# Patient Record
Sex: Female | Born: 1975 | Race: White | Hispanic: No | Marital: Married | Smoking: Former smoker
Health system: Southern US, Community
[De-identification: ages and names within clinical notes are randomized; demographics above are authoritative.]

## PROBLEM LIST (undated history)

## (undated) DIAGNOSIS — K219 Gastro-esophageal reflux disease without esophagitis: Secondary | ICD-10-CM

## (undated) DIAGNOSIS — F419 Anxiety disorder, unspecified: Secondary | ICD-10-CM

## (undated) DIAGNOSIS — J45909 Unspecified asthma, uncomplicated: Secondary | ICD-10-CM

## (undated) DIAGNOSIS — T783XXA Angioneurotic edema, initial encounter: Secondary | ICD-10-CM

## (undated) HISTORY — PX: TUBAL LIGATION: SHX77

## (undated) HISTORY — DX: Unspecified asthma, uncomplicated: J45.909

## (undated) HISTORY — DX: Anxiety disorder, unspecified: F41.9

## (undated) HISTORY — DX: Angioneurotic edema, initial encounter: T78.3XXA

## (undated) HISTORY — PX: TYMPANOSTOMY TUBE PLACEMENT: SHX32

## (undated) HISTORY — PX: GALLBLADDER SURGERY: SHX652

---

## 2004-10-13 ENCOUNTER — Ambulatory Visit: Payer: Self-pay | Admitting: *Deleted

## 2004-12-08 ENCOUNTER — Ambulatory Visit: Payer: Self-pay | Admitting: Family Medicine

## 2005-07-28 ENCOUNTER — Ambulatory Visit: Payer: Self-pay | Admitting: Family Medicine

## 2005-08-10 ENCOUNTER — Ambulatory Visit: Payer: Self-pay | Admitting: Family Medicine

## 2005-09-03 ENCOUNTER — Ambulatory Visit: Payer: Self-pay | Admitting: Family Medicine

## 2005-09-07 ENCOUNTER — Ambulatory Visit: Payer: Self-pay | Admitting: Family Medicine

## 2005-10-27 ENCOUNTER — Ambulatory Visit: Payer: Self-pay | Admitting: Family Medicine

## 2006-02-26 ENCOUNTER — Ambulatory Visit: Payer: Self-pay | Admitting: Family Medicine

## 2006-11-30 ENCOUNTER — Ambulatory Visit: Payer: Self-pay | Admitting: Family Medicine

## 2011-01-04 ENCOUNTER — Emergency Department (HOSPITAL_COMMUNITY)
Admission: EM | Admit: 2011-01-04 | Discharge: 2011-01-04 | Disposition: A | Discharge status: Home / Self Care | Payer: PRIVATE HEALTH INSURANCE | Attending: Emergency Medicine | Admitting: Emergency Medicine

## 2011-01-04 ENCOUNTER — Emergency Department (HOSPITAL_COMMUNITY): Payer: PRIVATE HEALTH INSURANCE

## 2011-01-04 DIAGNOSIS — R509 Fever, unspecified: Secondary | ICD-10-CM | POA: Insufficient documentation

## 2011-01-04 DIAGNOSIS — J40 Bronchitis, not specified as acute or chronic: Secondary | ICD-10-CM | POA: Insufficient documentation

## 2011-01-04 DIAGNOSIS — R51 Headache: Secondary | ICD-10-CM | POA: Insufficient documentation

## 2011-01-04 DIAGNOSIS — R05 Cough: Secondary | ICD-10-CM | POA: Insufficient documentation

## 2011-01-04 DIAGNOSIS — J029 Acute pharyngitis, unspecified: Secondary | ICD-10-CM | POA: Insufficient documentation

## 2011-01-04 DIAGNOSIS — R059 Cough, unspecified: Secondary | ICD-10-CM | POA: Insufficient documentation

## 2011-01-04 LAB — RAPID STREP SCREEN (MED CTR MEBANE ONLY): Streptococcus, Group A Screen (Direct): NEGATIVE

## 2014-02-08 ENCOUNTER — Encounter (HOSPITAL_COMMUNITY): Payer: Self-pay | Admitting: Emergency Medicine

## 2014-02-08 ENCOUNTER — Emergency Department (HOSPITAL_COMMUNITY)
Admission: EM | Admit: 2014-02-08 | Discharge: 2014-02-08 | Disposition: A | Discharge status: Home / Self Care | Payer: Medicaid Other | Attending: Emergency Medicine | Admitting: Emergency Medicine

## 2014-02-08 DIAGNOSIS — Z79899 Other long term (current) drug therapy: Secondary | ICD-10-CM | POA: Insufficient documentation

## 2014-02-08 DIAGNOSIS — L578 Other skin changes due to chronic exposure to nonionizing radiation: Secondary | ICD-10-CM | POA: Insufficient documentation

## 2014-02-08 DIAGNOSIS — IMO0002 Reserved for concepts with insufficient information to code with codable children: Secondary | ICD-10-CM | POA: Insufficient documentation

## 2014-02-08 MED ORDER — DEXAMETHASONE SODIUM PHOSPHATE 4 MG/ML IJ SOLN
8.0000 mg | Freq: Once | INTRAMUSCULAR | Status: AC
  Administered 2014-02-08: 8 mg via INTRAMUSCULAR
  Filled 2014-02-08: qty 2

## 2014-02-08 MED ORDER — IBUPROFEN 800 MG PO TABS
800.0000 mg | ORAL_TABLET | Freq: Once | ORAL | Status: AC
  Administered 2014-02-08: 800 mg via ORAL
  Filled 2014-02-08: qty 1

## 2014-02-08 MED ORDER — CEPHALEXIN 500 MG PO CAPS
500.0000 mg | ORAL_CAPSULE | Freq: Once | ORAL | Status: AC
  Administered 2014-02-08: 500 mg via ORAL
  Filled 2014-02-08: qty 1

## 2014-02-08 MED ORDER — DEXAMETHASONE 6 MG PO TABS: ORAL_TABLET | ORAL | Status: DC

## 2014-02-08 MED ORDER — CEPHALEXIN 500 MG PO CAPS: 500.0000 mg | ORAL_CAPSULE | Freq: Four times a day (QID) | ORAL | Status: DC

## 2014-02-08 MED ORDER — IBUPROFEN 800 MG PO TABS: 800.0000 mg | ORAL_TABLET | Freq: Three times a day (TID) | ORAL | Status: DC
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
## 2014-02-08 NOTE — ED Provider Notes (Signed)
Medical screening examination/treatment/procedure(s) were performed by non-physician practitioner and as supervising physician I was immediately available for consultation/collaboration.   EKG Interpretation None        Chrisoula Zegarra M Tiombe Tomeo, MD 02/08/14 1656 
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
## 2014-02-08 NOTE — ED Notes (Signed)
Pt c/o facial swelling and anxiety since Tuesday. Denies sob. Airway patent. Voice clear. No hoarseness.

## 2014-02-17 ENCOUNTER — Emergency Department (HOSPITAL_COMMUNITY): Payer: Medicaid Other

## 2014-02-17 ENCOUNTER — Emergency Department (HOSPITAL_COMMUNITY)
Admission: EM | Admit: 2014-02-17 | Discharge: 2014-02-17 | Disposition: A | Discharge status: Home / Self Care | Payer: Medicaid Other | Attending: Emergency Medicine | Admitting: Emergency Medicine

## 2014-02-17 ENCOUNTER — Encounter (HOSPITAL_COMMUNITY): Payer: Self-pay | Admitting: Emergency Medicine

## 2014-02-17 DIAGNOSIS — R109 Unspecified abdominal pain: Secondary | ICD-10-CM

## 2014-02-17 DIAGNOSIS — Z3202 Encounter for pregnancy test, result negative: Secondary | ICD-10-CM | POA: Insufficient documentation

## 2014-02-17 DIAGNOSIS — Z792 Long term (current) use of antibiotics: Secondary | ICD-10-CM | POA: Insufficient documentation

## 2014-02-17 DIAGNOSIS — R1011 Right upper quadrant pain: Secondary | ICD-10-CM | POA: Insufficient documentation

## 2014-02-17 DIAGNOSIS — IMO0002 Reserved for concepts with insufficient information to code with codable children: Secondary | ICD-10-CM | POA: Insufficient documentation

## 2014-02-17 DIAGNOSIS — Z791 Long term (current) use of non-steroidal anti-inflammatories (NSAID): Secondary | ICD-10-CM | POA: Insufficient documentation

## 2014-02-17 DIAGNOSIS — Z872 Personal history of diseases of the skin and subcutaneous tissue: Secondary | ICD-10-CM | POA: Insufficient documentation

## 2014-02-17 DIAGNOSIS — Z9851 Tubal ligation status: Secondary | ICD-10-CM | POA: Insufficient documentation

## 2014-02-17 LAB — CBC WITH DIFFERENTIAL/PLATELET
Basophils Absolute: 0 10*3/uL (ref 0.0–0.1)
Basophils Relative: 0 % (ref 0–1)
EOS ABS: 0.2 10*3/uL (ref 0.0–0.7)
EOS PCT: 2 % (ref 0–5)
HEMATOCRIT: 44.9 % (ref 36.0–46.0)
Hemoglobin: 15.6 g/dL — ABNORMAL HIGH (ref 12.0–15.0)
LYMPHS ABS: 2.6 10*3/uL (ref 0.7–4.0)
LYMPHS PCT: 24 % (ref 12–46)
MCH: 31.6 pg (ref 26.0–34.0)
MCHC: 34.7 g/dL (ref 30.0–36.0)
MCV: 91.1 fL (ref 78.0–100.0)
MONO ABS: 0.6 10*3/uL (ref 0.1–1.0)
Monocytes Relative: 6 % (ref 3–12)
Neutro Abs: 7.6 10*3/uL (ref 1.7–7.7)
Neutrophils Relative %: 69 % (ref 43–77)
Platelets: 238 10*3/uL (ref 150–400)
RBC: 4.93 MIL/uL (ref 3.87–5.11)
RDW: 12.7 % (ref 11.5–15.5)
WBC: 11 10*3/uL — AB (ref 4.0–10.5)

## 2014-02-17 LAB — URINALYSIS, ROUTINE W REFLEX MICROSCOPIC
BILIRUBIN URINE: NEGATIVE
Glucose, UA: NEGATIVE mg/dL
KETONES UR: NEGATIVE mg/dL
Leukocytes, UA: NEGATIVE
Nitrite: NEGATIVE
Protein, ur: NEGATIVE mg/dL
SPECIFIC GRAVITY, URINE: 1.02 (ref 1.005–1.030)
UROBILINOGEN UA: 0.2 mg/dL (ref 0.0–1.0)
pH: 6 (ref 5.0–8.0)

## 2014-02-17 LAB — COMPREHENSIVE METABOLIC PANEL
ALT: 63 U/L — ABNORMAL HIGH (ref 0–35)
AST: 131 U/L — ABNORMAL HIGH (ref 0–37)
Albumin: 2.9 g/dL — ABNORMAL LOW (ref 3.5–5.2)
Alkaline Phosphatase: 70 U/L (ref 39–117)
BUN: 13 mg/dL (ref 6–23)
CALCIUM: 8.9 mg/dL (ref 8.4–10.5)
CO2: 24 meq/L (ref 19–32)
CREATININE: 0.76 mg/dL (ref 0.50–1.10)
Chloride: 100 mEq/L (ref 96–112)
GLUCOSE: 96 mg/dL (ref 70–99)
Potassium: 3.8 mEq/L (ref 3.7–5.3)
Sodium: 135 mEq/L — ABNORMAL LOW (ref 137–147)
Total Bilirubin: 0.4 mg/dL (ref 0.3–1.2)
Total Protein: 6.9 g/dL (ref 6.0–8.3)

## 2014-02-17 LAB — URINE MICROSCOPIC-ADD ON

## 2014-02-17 LAB — PREGNANCY, URINE: Preg Test, Ur: NEGATIVE

## 2014-02-17 LAB — LIPASE, BLOOD: LIPASE: 47 U/L (ref 11–59)

## 2014-02-17 MED ORDER — HYDROMORPHONE HCL PF 1 MG/ML IJ SOLN
0.5000 mg | Freq: Once | INTRAMUSCULAR | Status: AC
  Administered 2014-02-17: 0.5 mg via INTRAVENOUS
  Filled 2014-02-17: qty 1

## 2014-02-17 MED ORDER — CIPROFLOXACIN HCL 500 MG PO TABS: 500.0000 mg | ORAL_TABLET | Freq: Two times a day (BID) | ORAL | Status: DC

## 2014-02-17 MED ORDER — HYDROCODONE-ACETAMINOPHEN 5-325 MG PO TABS: 1.0000 | ORAL_TABLET | Freq: Four times a day (QID) | ORAL | Status: DC | PRN

## 2014-02-17 MED ORDER — ONDANSETRON HCL 4 MG/2ML IJ SOLN
4.0000 mg | Freq: Once | INTRAMUSCULAR | Status: AC
  Administered 2014-02-17: 4 mg via INTRAVENOUS
  Filled 2014-02-17: qty 2

## 2014-02-17 MED ORDER — PROMETHAZINE HCL 25 MG PO TABS: 25.0000 mg | ORAL_TABLET | Freq: Four times a day (QID) | ORAL | Status: DC | PRN

## 2014-02-17 NOTE — Discharge Instructions (Signed)
Follow up with dr. Lovell SheehanJenkins Tuesday at 930am.  Call Monday to confirm

## 2014-02-17 NOTE — ED Provider Notes (Signed)
CSN: [id number]     Arrival date & time 02/17/14  [id number] History   This chart was scribed for Summer LennertJoseph L Pattrick Bady, MD by Marica OtterNusrat Rahman, ED Scribe. This patient was seen in room APA06/APA06 and the patient's care was started at 7:41 AM.  PCP: PROVIDER NOT IN SYSTEM   Chief Complaint  Patient presents with  . Flank Pain   Patient is a 38 y.o. female presenting with flank pain. The history is provided by the patient. No language interpreter was used.  Flank Pain The current episode started 3 to 5 hours ago. The problem occurs constantly. The problem has not changed since onset.Associated symptoms include abdominal pain.   HPI Comments: Summer [name] is a 38 y.o. female who presents to the Emergency Department complaining of constant right flank pain and associated RUQ abd pain onset at approximately 0300 today. Pt denies fever, chills, Hx of surgery of the abd. Pt denies any other symptoms. Pt denies any Hx of having her gallbladder checked.   Pt was treated at the ED on 02/08/14 for dermatitis due to sunburn. Pt was prescribed oral steroids, ibuprofen 800 mg, and keflex 500mg  for possible secondary infection.   History reviewed. No pertinent past medical history. Past Surgical History  Procedure Laterality Date  . Tubal ligation    . Tympanostomy tube placement     No family history on file. History  Substance Use Topics  . Smoking status: Never Smoker   . Smokeless tobacco: Not on file  . Alcohol Use: No   OB History   Grav Para Term Preterm Abortions TAB SAB Ect Mult Living                 Review of Systems  Gastrointestinal: Positive for abdominal pain.  Genitourinary: Positive for flank pain (right ).  All other systems reviewed and are negative.     Allergies  Review of patient's allergies indicates no known allergies.  Home Medications   Current Outpatient Rx  Name  Route  Sig  Dispense  Refill  . cephALEXin (KEFLEX) 500 MG capsule   Oral   Take 1 capsule (500 mg  total) by mouth 4 (four) times daily.   20 capsule   0   . dexamethasone (DECADRON) 6 MG tablet      1 po bid with food   10 tablet   0   . diphenhydrAMINE (BENADRYL) 25 mg capsule   Oral   Take 25 mg by mouth every 6 (six) hours as needed for allergies.         . hydrocortisone cream 1 %   Topical   Apply 1 application topically 2 (two) times daily.         Marland Kitchen. ibuprofen (ADVIL,MOTRIN) 800 MG tablet   Oral   Take 1 tablet (800 mg total) by mouth 3 (three) times daily.   21 tablet   0     Take with food   . ranitidine (ZANTAC) 75 MG tablet   Oral   Take 75 mg by mouth 2 (two) times daily.          Triage Vitals: BP 104/60  Pulse 83  Temp(Src) 98.2 F (36.8 C) (Oral)  Resp 16  Ht 5\' 3"  (1.6 m)  Wt 210 lb (95.255 kg)  BMI 37.21 kg/m2  SpO2 98%  LMP 01/17/2014 Physical Exam  Nursing note and vitals reviewed. Constitutional: She is oriented to person, place, and time. She appears well-developed.  HENT:  Head:  Normocephalic.  Eyes: Conjunctivae and EOM are normal. No scleral icterus.  Neck: Neck supple. No thyromegaly present.  Cardiovascular: Normal rate and regular rhythm.  Exam reveals no gallop and no friction rub.   No murmur heard. Pulmonary/Chest: No stridor. She has no wheezes. She has no rales. She exhibits no tenderness.  Abdominal: She exhibits no distension. There is tenderness ( moderate RUQ tenderness). There is no rebound.  Musculoskeletal: Normal range of motion. She exhibits no edema.  Lymphadenopathy:    She has no cervical adenopathy.  Neurological: She is oriented to person, place, and time. She exhibits normal muscle tone. Coordination normal.  Skin: Skin is warm and dry. No rash noted. No erythema.  Psychiatric: She has a normal mood and affect. Her behavior is normal.    ED Course  Procedures (including critical care time) DIAGNOSTIC STUDIES: Oxygen Saturation is 98% on RA, normal by my interpretation.    COORDINATION OF  CARE: 7:43 AM-Discussed treatment plan which includes UA, meds and labs with pt at bedside and pt agreed to plan.   Labs Review Labs Reviewed  URINALYSIS, ROUTINE W REFLEX MICROSCOPIC   Imaging Review No results found.   EKG Interpretation None    spoke wit dr. Lovell Sheehan and he will follow up Tuesday with  The pt  MDM   Final diagnoses:  None   The chart was scribed for me under my direct supervision.  I personally performed the history, physical, and medical decision making and all procedures in the evaluation of this patient.Summer Lennert, MD 02/17/14 1003

## 2014-02-17 NOTE — ED Notes (Signed)
Complain of pain in right flank area that started around 0300 today. Denies other symptoms

## 2014-02-20 ENCOUNTER — Encounter (HOSPITAL_COMMUNITY)
Admission: RE | Admit: 2014-02-20 | Discharge: 2014-02-20 | Disposition: A | Discharge status: Home / Self Care | Payer: Medicaid Other | Source: Ambulatory Visit | Attending: General Surgery | Admitting: General Surgery

## 2014-02-20 ENCOUNTER — Encounter (HOSPITAL_COMMUNITY): Payer: Self-pay

## 2014-02-20 HISTORY — DX: Gastro-esophageal reflux disease without esophagitis: K21.9

## 2014-02-20 NOTE — Patient Instructions (Signed)
Summer [name]  02/20/2014   Your procedure is scheduled on:  02/21/2014  [facility] at  825  AM.  Call this number if you have problems the morning of surgery: [phone]   Remember:   Do not eat food or drink liquids after midnight.   Take these medicines the morning of surgery with A SIP OF WATER:  Decadron, hydorcodone, phenergan, zantac   Do not wear jewelry, make-up or nail polish.  Do not wear lotions, powders, or perfumes.   Do not shave 48 hours prior to surgery. Men may shave face and neck.  Do not bring valuables to the hospital.  [facility] any belongings or valuables.               Contacts, dentures or bridgework may not be worn into surgery.  Leave suitcase in the car. After surgery it may be brought to your room.  For patients admitted to the hospital, discharge time is determined by your treatment team.               Patients discharged the day of surgery will not be allowed to drive home.  Name and phone number of your driver: family  Special Instructions: Shower using CHG 2 nights before surgery and the night before surgery.  If you shower the day of surgery use CHG.  Use special wash - you have one bottle of CHG for all showers.  You should use approximately 1/3 of the bottle for each shower.   Please read over the following fact sheets that you were given: Pain Booklet, Coughing and Deep Breathing, Surgical Site Infection Prevention, Anesthesia Post-op Instructions and Care and Recovery After Surgery Laparoscopic Cholecystectomy Laparoscopic cholecystectomy is surgery to remove the gallbladder. The gallbladder is located in the upper right part of the abdomen, behind the liver. It is a storage sac for bile produced in the liver. Bile aids in the digestion and absorption of fats. Cholecystectomy is often done for inflammation of the gallbladder (cholecystitis). This condition is usually caused by a buildup of gallstones  (cholelithiasis) in your gallbladder. Gallstones can block the flow of bile, resulting in inflammation and pain. In severe cases, emergency surgery may be required. When emergency surgery is not required, you will have time to prepare for the procedure. Laparoscopic surgery is an alternative to open surgery. Laparoscopic surgery has a shorter recovery time. Your common bile duct may also need to be examined during the procedure. If stones are found in the common bile duct, they may be removed. LET [facility] KNOW ABOUT:  Any allergies you have.  All medicines you are taking, including vitamins, herbs, eye drops, creams, and over-the-counter medicines.  Previous problems you or members of your family have had with the use of anesthetics.  Any blood disorders you have.  Previous surgeries you have had.  Medical conditions you have. RISKS AND COMPLICATIONS Generally, this is a safe procedure. However, as with any procedure, complications can occur. Possible complications include:  Infection.  Damage to the common bile duct, nerves, arteries, veins, or other internal organs such as the stomach, liver, or intestines.  Bleeding.  A stone may remain in the common bile duct.  A bile leak from the cyst duct that is clipped when your gallbladder is removed.  The need to convert to open surgery, which requires a larger incision in the abdomen. This may be necessary if your surgeon  thinks it is not safe to continue with a laparoscopic procedure. BEFORE THE PROCEDURE  Ask your health care provider about changing or stopping any regular medicines. You will need to stop taking aspirin or blood thinners at least 5 days prior to surgery.  Do not eat or drink anything after midnight the night before surgery.  Let your health care provider know if you develop a cold or other infectious problem before surgery. PROCEDURE   You will be given medicine to make you sleep through the  procedure (general anesthetic). A breathing tube will be placed in your mouth.  When you are asleep, your surgeon will make several small cuts (incisions) in your abdomen.  A thin, lighted tube with a tiny camera on the end (laparoscope) is inserted through one of the small incisions. The camera on the laparoscope sends a picture to a TV screen in the operating room. This gives the surgeon a good view inside your abdomen.  A gas will be pumped into your abdomen. This expands your abdomen so that the surgeon has more room to perform the surgery.  Other tools needed for the procedure are inserted through the other incisions. The gallbladder is removed through one of the incisions.  After the removal of your gallbladder, the incisions will be closed with stitches, staples, or skin glue. AFTER THE PROCEDURE  You will be taken to a recovery area where your progress will be checked often.  You may be allowed to go home the same day if your pain is controlled and you can tolerate liquids. Document Released: 10/26/2005 Document Revised: 08/16/2013 Document Reviewed: 06/07/2013 [facility] Patient Information 2014 Summer [name]. PATIENT INSTRUCTIONS POST-ANESTHESIA  IMMEDIATELY FOLLOWING SURGERY:  Do not drive or operate machinery for the first twenty four hours after surgery.  Do not make any important decisions for twenty four hours after surgery or while taking narcotic pain medications or sedatives.  If you develop intractable nausea and vomiting or a severe headache please notify your doctor immediately.  FOLLOW-UP:  Please make an appointment with your surgeon as instructed. You do not need to follow up with anesthesia unless specifically instructed to do so.  WOUND CARE INSTRUCTIONS (if applicable):  Keep a dry clean dressing on the anesthesia/puncture wound site if there is drainage.  Once the wound has quit draining you may leave it open to air.  Generally you should leave the bandage intact  for twenty four hours unless there is drainage.  If the epidural site drains for more than 36-48 hours please call the anesthesia department.  QUESTIONS?:  Please feel free to call your physician or the hospital operator if you have any questions, and they will be happy to assist you.

## 2014-02-20 NOTE — Pre-Procedure Instructions (Signed)
Patient given information to sign up for my chart at home. 

## 2014-02-21 ENCOUNTER — Ambulatory Visit (HOSPITAL_COMMUNITY)
Admission: RE | Admit: 2014-02-21 | Discharge: 2014-02-21 | Disposition: A | Discharge status: Home / Self Care | Payer: Medicaid Other | Source: Ambulatory Visit | Attending: General Surgery | Admitting: General Surgery

## 2014-02-21 ENCOUNTER — Encounter (HOSPITAL_COMMUNITY)
Admission: RE | Disposition: A | Discharge status: Home / Self Care | Payer: Self-pay | Source: Ambulatory Visit | Attending: General Surgery

## 2014-02-21 ENCOUNTER — Encounter (HOSPITAL_COMMUNITY): Payer: Self-pay | Admitting: *Deleted

## 2014-02-21 ENCOUNTER — Encounter (HOSPITAL_COMMUNITY): Payer: Medicaid Other | Admitting: Anesthesiology

## 2014-02-21 ENCOUNTER — Ambulatory Visit (HOSPITAL_COMMUNITY): Payer: Medicaid Other | Admitting: Anesthesiology

## 2014-02-21 DIAGNOSIS — Z01812 Encounter for preprocedural laboratory examination: Secondary | ICD-10-CM | POA: Insufficient documentation

## 2014-02-21 DIAGNOSIS — K219 Gastro-esophageal reflux disease without esophagitis: Secondary | ICD-10-CM | POA: Insufficient documentation

## 2014-02-21 DIAGNOSIS — Z87891 Personal history of nicotine dependence: Secondary | ICD-10-CM | POA: Insufficient documentation

## 2014-02-21 DIAGNOSIS — Z79899 Other long term (current) drug therapy: Secondary | ICD-10-CM | POA: Insufficient documentation

## 2014-02-21 DIAGNOSIS — K8010 Calculus of gallbladder with chronic cholecystitis without obstruction: Secondary | ICD-10-CM | POA: Insufficient documentation

## 2014-02-21 HISTORY — PX: CHOLECYSTECTOMY: SHX55

## 2014-02-21 SURGERY — LAPAROSCOPIC CHOLECYSTECTOMY
Anesthesia: General | Site: Abdomen

## 2014-02-21 MED ORDER — BUPIVACAINE HCL (PF) 0.5 % IJ SOLN
INTRAMUSCULAR | Status: DC | PRN
  Administered 2014-02-21: 10 mL

## 2014-02-21 MED ORDER — PROPOFOL 10 MG/ML IV BOLUS
INTRAVENOUS | Status: DC | PRN
  Administered 2014-02-21: 150 mg via INTRAVENOUS

## 2014-02-21 MED ORDER — CHLORHEXIDINE GLUCONATE 4 % EX LIQD: 1.0000 "application " | Freq: Once | CUTANEOUS | Status: DC

## 2014-02-21 MED ORDER — OXYCODONE-ACETAMINOPHEN 7.5-325 MG PO TABS
1.0000 | ORAL_TABLET | ORAL | Status: DC | PRN
Start: 2014-02-21 — End: 2015-12-16

## 2014-02-21 MED ORDER — ONDANSETRON HCL 4 MG/2ML IJ SOLN
INTRAMUSCULAR | Status: AC
  Filled 2014-02-21: qty 2

## 2014-02-21 MED ORDER — ROCURONIUM BROMIDE 100 MG/10ML IV SOLN
INTRAVENOUS | Status: DC | PRN
  Administered 2014-02-21: 15 mg via INTRAVENOUS

## 2014-02-21 MED ORDER — KETOROLAC TROMETHAMINE 30 MG/ML IJ SOLN
30.0000 mg | Freq: Once | INTRAMUSCULAR | Status: AC
  Administered 2014-02-21: 30 mg via INTRAVENOUS

## 2014-02-21 MED ORDER — BUPIVACAINE HCL (PF) 0.5 % IJ SOLN
INTRAMUSCULAR | Status: AC
  Filled 2014-02-21: qty 30

## 2014-02-21 MED ORDER — FENTANYL CITRATE 0.05 MG/ML IJ SOLN
25.0000 ug | INTRAMUSCULAR | Status: DC | PRN
  Administered 2014-02-21 (×2): 50 ug via INTRAVENOUS

## 2014-02-21 MED ORDER — GLYCOPYRROLATE 0.2 MG/ML IJ SOLN
INTRAMUSCULAR | Status: AC
  Filled 2014-02-21: qty 1

## 2014-02-21 MED ORDER — DEXTROSE 5 % IV SOLN
INTRAVENOUS | Status: DC | PRN
  Administered 2014-02-21: 10:00:00 via INTRAVENOUS

## 2014-02-21 MED ORDER — MIDAZOLAM HCL 2 MG/2ML IJ SOLN
1.0000 mg | INTRAMUSCULAR | Status: DC | PRN
  Administered 2014-02-21: 2 mg via INTRAVENOUS

## 2014-02-21 MED ORDER — PROPOFOL 10 MG/ML IV BOLUS
INTRAVENOUS | Status: AC
  Filled 2014-02-21: qty 20

## 2014-02-21 MED ORDER — SUCCINYLCHOLINE CHLORIDE 20 MG/ML IJ SOLN
INTRAMUSCULAR | Status: DC | PRN
  Administered 2014-02-21: 160 mg via INTRAVENOUS

## 2014-02-21 MED ORDER — GLYCOPYRROLATE 0.2 MG/ML IJ SOLN
INTRAMUSCULAR | Status: DC | PRN
  Administered 2014-02-21: 0.2 mg via INTRAVENOUS

## 2014-02-21 MED ORDER — CIPROFLOXACIN IN D5W 400 MG/200ML IV SOLN
400.0000 mg | INTRAVENOUS | Status: AC
  Administered 2014-02-21: 400 mg via INTRAVENOUS
  Filled 2014-02-21: qty 200

## 2014-02-21 MED ORDER — NEOSTIGMINE METHYLSULFATE 1 MG/ML IJ SOLN
INTRAMUSCULAR | Status: DC | PRN
  Administered 2014-02-21 (×2): 1 mg via INTRAVENOUS

## 2014-02-21 MED ORDER — ONDANSETRON HCL 4 MG/2ML IJ SOLN
4.0000 mg | Freq: Once | INTRAMUSCULAR | Status: AC
  Administered 2014-02-21: 4 mg via INTRAVENOUS

## 2014-02-21 MED ORDER — MIDAZOLAM HCL 2 MG/2ML IJ SOLN
INTRAMUSCULAR | Status: AC
Start: 2014-02-21 — End: 2014-02-21
  Filled 2014-02-21: qty 2

## 2014-02-21 MED ORDER — 0.9 % SODIUM CHLORIDE (POUR BTL) OPTIME
TOPICAL | Status: DC | PRN
  Administered 2014-02-21: 1000 mL

## 2014-02-21 MED ORDER — POVIDONE-IODINE 10 % OINT PACKET
TOPICAL_OINTMENT | CUTANEOUS | Status: DC | PRN
  Administered 2014-02-21: 1 via TOPICAL

## 2014-02-21 MED ORDER — FENTANYL CITRATE 0.05 MG/ML IJ SOLN
INTRAMUSCULAR | Status: DC | PRN
  Administered 2014-02-21: 50 ug via INTRAVENOUS
  Administered 2014-02-21: 100 ug via INTRAVENOUS
  Administered 2014-02-21 (×4): 50 ug via INTRAVENOUS

## 2014-02-21 MED ORDER — MIDAZOLAM HCL 2 MG/2ML IJ SOLN
INTRAMUSCULAR | Status: AC
  Filled 2014-02-21: qty 2

## 2014-02-21 MED ORDER — LIDOCAINE HCL 1 % IJ SOLN
INTRAMUSCULAR | Status: DC | PRN
  Administered 2014-02-21: 50 mg via INTRADERMAL

## 2014-02-21 MED ORDER — MIDAZOLAM HCL 5 MG/5ML IJ SOLN
INTRAMUSCULAR | Status: DC | PRN
  Administered 2014-02-21: 2 mg via INTRAVENOUS

## 2014-02-21 MED ORDER — POVIDONE-IODINE 10 % EX OINT
TOPICAL_OINTMENT | CUTANEOUS | Status: AC
Start: 2014-02-21 — End: 2014-02-21
  Filled 2014-02-21: qty 1

## 2014-02-21 MED ORDER — KETOROLAC TROMETHAMINE 30 MG/ML IJ SOLN
INTRAMUSCULAR | Status: AC
  Filled 2014-02-21: qty 1

## 2014-02-21 MED ORDER — DEXAMETHASONE SODIUM PHOSPHATE 4 MG/ML IJ SOLN
4.0000 mg | Freq: Once | INTRAMUSCULAR | Status: AC
  Administered 2014-02-21: 4 mg via INTRAVENOUS

## 2014-02-21 MED ORDER — DEXAMETHASONE SODIUM PHOSPHATE 4 MG/ML IJ SOLN
INTRAMUSCULAR | Status: AC
  Filled 2014-02-21: qty 1

## 2014-02-21 MED ORDER — HEMOSTATIC AGENTS (NO CHARGE) OPTIME
TOPICAL | Status: DC | PRN
  Administered 2014-02-21: 1 via TOPICAL

## 2014-02-21 MED ORDER — LACTATED RINGERS IV SOLN
INTRAVENOUS | Status: DC
  Administered 2014-02-21 (×2): via INTRAVENOUS

## 2014-02-21 MED ORDER — GLYCOPYRROLATE 0.2 MG/ML IJ SOLN
0.2000 mg | Freq: Once | INTRAMUSCULAR | Status: AC
  Administered 2014-02-21: 0.2 mg via INTRAVENOUS

## 2014-02-21 MED ORDER — FENTANYL CITRATE 0.05 MG/ML IJ SOLN
INTRAMUSCULAR | Status: AC
  Filled 2014-02-21: qty 2

## 2014-02-21 MED ORDER — ONDANSETRON HCL 4 MG/2ML IJ SOLN: 4.0000 mg | Freq: Once | INTRAMUSCULAR | Status: DC | PRN

## 2014-02-21 MED ORDER — FENTANYL CITRATE 0.05 MG/ML IJ SOLN
INTRAMUSCULAR | Status: AC
  Filled 2014-02-21: qty 5

## 2014-02-21 SURGICAL SUPPLY — 48 items
APPLIER CLIP LAPSCP 10X32 DD (CLIP) ×3 IMPLANT
BAG HAMPER (MISCELLANEOUS) ×3 IMPLANT
BAG SPEC RTRVL LRG 6X4 10 (ENDOMECHANICALS) ×1
CLOTH BEACON ORANGE TIMEOUT ST (SAFETY) ×3 IMPLANT
COVER LIGHT HANDLE STERIS (MISCELLANEOUS) ×6 IMPLANT
DECANTER SPIKE VIAL GLASS SM (MISCELLANEOUS) ×3 IMPLANT
DURAPREP 26ML APPLICATOR (WOUND CARE) ×3 IMPLANT
ELECT REM PT RETURN 9FT ADLT (ELECTROSURGICAL) ×3
ELECTRODE REM PT RTRN 9FT ADLT (ELECTROSURGICAL) ×1 IMPLANT
FILTER SMOKE EVAC LAPAROSHD (FILTER) ×3 IMPLANT
FORMALIN 10 PREFIL 120ML (MISCELLANEOUS) ×3 IMPLANT
GLOVE BIO SURGEON STRL SZ7.5 (GLOVE) ×3 IMPLANT
GLOVE BIOGEL PI IND STRL 7.0 (GLOVE) IMPLANT
GLOVE BIOGEL PI IND STRL 7.5 (GLOVE) IMPLANT
GLOVE BIOGEL PI IND STRL 8 (GLOVE) ×1 IMPLANT
GLOVE BIOGEL PI INDICATOR 7.0 (GLOVE) ×2
GLOVE BIOGEL PI INDICATOR 7.5 (GLOVE) ×2
GLOVE BIOGEL PI INDICATOR 8 (GLOVE) ×2
GLOVE ECLIPSE 7.0 STRL STRAW (GLOVE) ×2 IMPLANT
GLOVE EXAM NITRILE MD LF STRL (GLOVE) ×2 IMPLANT
GLOVE OPTIFIT SS 6.5 STRL BRWN (GLOVE) ×2 IMPLANT
GOWN STRL REUS W/TWL LRG LVL3 (GOWN DISPOSABLE) ×9 IMPLANT
HEMOSTAT SNOW SURGICEL 2X4 (HEMOSTASIS) ×3 IMPLANT
INST SET LAPROSCOPIC AP (KITS) ×3 IMPLANT
IV NS IRRIG 3000ML ARTHROMATIC (IV SOLUTION) IMPLANT
KIT ROOM TURNOVER APOR (KITS) ×3 IMPLANT
MANIFOLD NEPTUNE II (INSTRUMENTS) ×3 IMPLANT
NDL INSUFFLATION 14GA 120MM (NEEDLE) ×1 IMPLANT
NEEDLE INSUFFLATION 14GA 120MM (NEEDLE) ×3 IMPLANT
NS IRRIG 1000ML POUR BTL (IV SOLUTION) ×3 IMPLANT
PACK LAP CHOLE LZT030E (CUSTOM PROCEDURE TRAY) ×3 IMPLANT
PAD ARMBOARD 7.5X6 YLW CONV (MISCELLANEOUS) ×3 IMPLANT
POUCH SPECIMEN RETRIEVAL 10MM (ENDOMECHANICALS) ×3 IMPLANT
SET BASIN LINEN APH (SET/KITS/TRAYS/PACK) ×3 IMPLANT
SET TUBE IRRIG SUCTION NO TIP (IRRIGATION / IRRIGATOR) IMPLANT
SLEEVE ENDOPATH XCEL 5M (ENDOMECHANICALS) ×3 IMPLANT
SPONGE GAUZE 2X2 8PLY STER LF (GAUZE/BANDAGES/DRESSINGS) ×4
SPONGE GAUZE 2X2 8PLY STRL LF (GAUZE/BANDAGES/DRESSINGS) ×8 IMPLANT
SPONGE GAUZE 4X4 12PLY (GAUZE/BANDAGES/DRESSINGS) ×2 IMPLANT
STAPLER VISISTAT (STAPLE) ×3 IMPLANT
SUT VICRYL 0 UR6 27IN ABS (SUTURE) ×3 IMPLANT
TAPE CLOTH SURG 4X10 WHT LF (GAUZE/BANDAGES/DRESSINGS) ×2 IMPLANT
TROCAR ENDO BLADELESS 11MM (ENDOMECHANICALS) ×3 IMPLANT
TROCAR XCEL NON-BLD 5MMX100MML (ENDOMECHANICALS) ×3 IMPLANT
TROCAR XCEL UNIV SLVE 11M 100M (ENDOMECHANICALS) ×3 IMPLANT
TUBING INSUFFLATION (TUBING) ×3 IMPLANT
WARMER LAPAROSCOPE (MISCELLANEOUS) ×3 IMPLANT
YANKAUER SUCT 12FT TUBE ARGYLE (SUCTIONS) ×3 IMPLANT

## 2014-02-21 NOTE — Anesthesia Preprocedure Evaluation (Signed)
Anesthesia Evaluation  Patient identified by MRN, date of birth, ID band Patient awake    Reviewed: Allergy & Precautions, H&P , NPO status , Patient's Chart, lab work & pertinent test results  History of Anesthesia Complications Negative for: history of anesthetic complications  Airway Mallampati: II TM Distance: >3 FB     Dental  (+) Teeth Intact   Pulmonary former smoker,  breath sounds clear to auscultation        Cardiovascular negative cardio ROS  Rhythm:Regular Rate:Normal     Neuro/Psych    GI/Hepatic GERD-  Medicated,  Endo/Other    Renal/GU      Musculoskeletal   Abdominal   Peds  Hematology   Anesthesia Other Findings   Reproductive/Obstetrics                           Anesthesia Physical Anesthesia Plan  ASA: II  Anesthesia Plan: General   Post-op Pain Management:    Induction: Intravenous, Rapid sequence and Cricoid pressure planned  Airway Management Planned: Oral ETT  Additional Equipment:   Intra-op Plan:   Post-operative Plan: Extubation in OR  Informed Consent: I have reviewed the patients History and Physical, chart, labs and discussed the procedure including the risks, benefits and alternatives for the proposed anesthesia with the patient or authorized representative who has indicated his/her understanding and acceptance.     Plan Discussed with:   Anesthesia Plan Comments:         Anesthesia Quick Evaluation

## 2014-02-21 NOTE — Discharge Instructions (Signed)
Laparoscopic Cholecystectomy, Care After °Refer to this sheet in the next few weeks. These instructions provide you with information on caring for yourself after your procedure. Your health care provider may also give you more specific instructions. Your treatment has been planned according to current medical practices, but problems sometimes occur. Call your health care provider if you have any problems or questions after your procedure. °WHAT TO EXPECT AFTER THE PROCEDURE °After your procedure, it is typical to have the following: °· Pain at your incision sites. You will be given pain medicines to control the pain. °· Mild nausea or vomiting. This should improve after the first 24 hours. °· Bloating and possibly shoulder pain from the gas used during the procedure. This will improve after the first 24 hours. °HOME CARE INSTRUCTIONS  °· Change bandages (dressings) as directed by your health care provider. °· Keep the wound dry and clean. You may wash the wound gently with soap and water. Gently blot or dab the area dry. °· Do not take baths or use swimming pools or hot tubs for 2 weeks or until your health care provider approves. °· Only take over-the-counter or prescription medicines as directed by your health care provider. °· Continue your normal diet as directed by your health care provider. °· Do not lift anything heavier than 10 pounds (4.5 kg) until your health care provider approves. °· Do not play contact sports for 1 week or until your health care provider approves. °SEEK MEDICAL CARE IF:  °· You have redness, swelling, or increasing pain in the wound. °· You notice yellowish-white fluid (pus) coming from the wound. °· You have drainage from the wound that lasts longer than 1 day. °· You notice a bad smell coming from the wound or dressing. °· Your surgical cuts (incisions) break open. °SEEK IMMEDIATE MEDICAL CARE IF:  °· You develop a rash. °· You have difficulty breathing. °· You have chest pain. °· You  have a fever. °· You have increasing pain in the shoulders (shoulder strap areas). °· You have dizzy episodes or faint while standing. °· You have severe abdominal pain. °· You feel sick to your stomach (nauseous) or throw up (vomit) and this lasts for more than 1 day. °Document Released: 10/26/2005 Document Revised: 08/16/2013 Document Reviewed: 06/07/2013 °[employer]® Patient Information ©2014 [employer]. ° °

## 2014-02-21 NOTE — Op Note (Signed)
Patient:  Christella NoaJohnna C Radermacher  DOB:  03/14/1976  MRN:  [id number]   Preop Diagnosis:  Cholecystitis, cholelithiasis  Postop Diagnosis:  Same  Procedure:  Laparoscopic cholecystectomy  Surgeon:  Franky MachoMark Khalea Ventura, M.D.  Anes:  General endotracheal  Indications:  Patient is a 38 year old white female presents with cholecystitis secondary to cholelithiasis. The risks and benefits of the procedure including bleeding, infection, hepatobiliary injury, and the possibility of an open procedure were fully explained to the patient, who gave informed consent.  Procedure note:  The patient is placed the supine position. After induction of general endotracheal anesthesia, the abdomen was prepped and draped using usual sterile technique with DuraPrep. Surgical site confirmation was performed.  An infraumbilical incision was made down to the fascia. A Veress view was introduced into the abdominal cavity and confirmation of placement was done using the saline drop test. The abdomen was then insufflated to 16 mm mercury pressure. An 11 mm trocar was introduced into the abdominal cavity under direct visualization without difficulty. Patient was placed in reverse Trendelenburg position and additional 11 mm trocar was placed the epigastric region and 5 mm trochars were placed the right upper quadrant and right flank regions. The liver was inspected and noted within normal limits. The gallbladder was not particularly inflamed. The gallbladder was retracted in a dynamic fashion in order to expose the triangle of Calot. The cystic duct was first identified. Its juncture to the infundibulum fully identified. Endoclips were placed proximally and distally on the cystic duct, and the cystic duct was divided. This was likewise done cystic artery. The gallbladder was then freed away from the gallbladder fossa using Bovie electrocautery. The gallbladder was delivered through the epigastric trocar site using an Endo Catch bag. The  gallbladder fossa was inspected and no abnormal bleeding or bile leakage was noted. Surgicel is placed the gallbladder fossa. All fluid and air were then evacuated from the abdominal cavity prior to removal of the trochars.  All wounds were irrigated with normal saline. All wounds were injected with 0.5% Sensorcaine. The infraumbilical fashion as well as epigastric fascia reapproximated using 0 Vicryl interrupted sutures. All skin incisions were closed using staples. Betadine ointment and dry sterile dressings were applied.  All tape and needle counts were correct the end of the procedure. This was extubated in the operating room and transferred to PACU in stable condition.  Complications:  None  EBL:  Minimal  Specimen:  Gallbladder

## 2014-02-21 NOTE — Anesthesia Procedure Notes (Signed)
Procedure Name: Intubation Date/Time: 02/21/2014 10:37 AM Performed by: Despina HiddenIDACAVAGE, Marguerite Jarboe J Pre-anesthesia Checklist: Suction available, Patient being monitored, Emergency Drugs available and Patient identified Patient Re-evaluated:Patient Re-evaluated prior to inductionOxygen Delivery Method: Circle system utilized Preoxygenation: Pre-oxygenation with 100% oxygen Intubation Type: IV induction, Rapid sequence and Cricoid Pressure applied Ventilation: Mask ventilation without difficulty Laryngoscope Size: Mac and 3 Grade View: Grade I Tube type: Oral Number of attempts: 1 Airway Equipment and Method: Stylet Placement Confirmation: ETT inserted through vocal cords under direct vision,  breath sounds checked- equal and bilateral and positive ETCO2 Secured at: 22 cm Tube secured with: Tape Dental Injury: Teeth and Oropharynx as per pre-operative assessment

## 2014-02-21 NOTE — H&P (Signed)
  NTS SOAP Note  Vital Signs:  Vitals as of: 02/20/2014: Systolic 114: Diastolic 72: Heart Rate 90: Temp 8F: Height 725ft 3in: Weight 218Lbs 0 Ounces: Pain Level 2: BMI 38.62  BMI : 38.62 kg/m2  Subjective: This 38 Years 38 Months old Female presents for of gallstones.  Has been having intermittent right upper quadrant abdominal pain, nausea, and food intolerance over several weeks now.  Recently seen in ER.  U/S of gallbladder revealed cholelithiasis.  Normal common bile duct.  No ferver, chills, jaundice.  Review of Symptoms:  Constitutional:unremarkable   Head:unremarkable    Eyes:unremarkable   Nose/Mouth/Throat:unremarkable Cardiovascular:  unremarkable   Respiratory:unremarkable   Gastrointestin    abdominal pain,nausea,heartburn Genitourinary:unremarkable     Musculoskeletal:unremarkable   Skin:unremarkable Hematolgic/Lymphatic:unremarkable     Allergic/Immunologic:unremarkable     Past Medical History:    Reviewed  Past Medical History  Surgical History: BTL Medical Problems: none Allergies: tramadol Medications: none   Social History:Reviewed  Social History  Preferred Language: English Race:  White Ethnicity: Not Hispanic / Latino Age: 38 Years 3 Months Marital Status:  D Alcohol: no Recreational drug(s): no   Smoking Status: Unknown if ever smoked reviewed on 02/20/2014 Functional Status reviewed on 02/20/2014 ------------------------------------------------ Bathing: Normal Cooking: Normal Dressing: Normal Driving: Normal Eating: Normal Managing Meds: Normal Oral Care: Normal Shopping: Normal Toileting: Normal Transferring: Normal Walking: Normal Cognitive Status reviewed on 02/20/2014 ------------------------------------------------ Attention: Normal Decision Making: Normal Language: Normal Memory: Normal Motor: Normal Perception: Normal Problem Solving: Normal Visual and Spatial:  Normal   Family History:  Reviewed  Family Health History Mother, Living; Colon cancer; Ovarian cancer;  Father, Deceased; Heart disease;     Objective Information: General:  Well appearing, well nourished in no distress.   no scleral icterus Heart:  RRR, no murmur or gallop.  Normal S1, S2.  No S3, S4.  Lungs:    CTA bilaterally, no wheezes, rhonchi, rales.  Breathing unlabored. Abdomen:Soft, tender in right upper quadrant to palpation, ND, normal bowel sounds, no HSM, no masses.  No peritoneal signs.  Assessment:Cholecystitis, cholelithiasis  Diagnoses: 574.00 Calculus of gallbladder with acute cholecystitis (Calculus of gallbladder with acute cholecystitis without obstruction)  Procedures: [id number] - OFFICE OUTPATIENT NEW 30 MINUTES    Plan:  Scheduled for laparoscopic cholecystectomy on 02/21/14.   Patient Education:Alternative treatments to surgery were discussed with patient (and family).  Risks and benefits  of procedure including bleeding, infection, hepatobiliary injury, and the possibility of an open procedure were fully explained to the patient (and family) who gave informed consent. Patient/family questions were addressed.  Follow-up:Pending Surgery

## 2014-02-21 NOTE — Anesthesia Postprocedure Evaluation (Signed)
  Anesthesia Post-op Note  Patient: Summer [name]  Procedure(s) Performed: Procedure(s): LAPAROSCOPIC CHOLECYSTECTOMY (N/A)  Patient Location: PACU  Anesthesia Type:General  Level of Consciousness: awake, alert , oriented and patient cooperative  Airway and Oxygen Therapy: Patient Spontanous Breathing  Post-op Pain: 4 /10, moderate  Post-op Assessment: Post-op Vital signs reviewed, Patient's Cardiovascular Status Stable, Respiratory Function Stable, Patent Airway and Pain level controlled  Post-op Vital Signs: Reviewed and stable  Last Vitals:  Filed Vitals:   02/21/14 1130  BP: 113/56  Pulse: 76  Temp:   Resp: 18    Complications: No apparent anesthesia complications

## 2014-02-21 NOTE — Interval H&P Note (Signed)
History and Physical Interval Note:  02/21/2014 9:44 AM  Summer [name]  has presented today for surgery, with the diagnosis of cholelithiasis  The various methods of treatment have been discussed with the patient and family. After consideration of risks, benefits and other options for treatment, the patient has consented to  Procedure(s): LAPAROSCOPIC CHOLECYSTECTOMY (N/A) as a surgical intervention .  The patient's history has been reviewed, patient examined, no change in status, stable for surgery.  I have reviewed the patient's chart and labs.  Questions were answered to the patient's satisfaction.     Dalia HeadingMark A Deliah Strehlow

## 2014-02-21 NOTE — Transfer of Care (Signed)
Immediate Anesthesia Transfer of Care Note  Patient: Summer [name]  Procedure(s) Performed: Procedure(s): LAPAROSCOPIC CHOLECYSTECTOMY (N/A)  Patient Location: PACU  Anesthesia Type:General  Level of Consciousness: awake and patient cooperative  Airway & Oxygen Therapy: Patient Spontanous Breathing and Patient connected to face mask oxygen  Post-op Assessment: Report given to PACU RN, Post -op Vital signs reviewed and stable and Patient moving all extremities  Post vital signs: Reviewed and stable  Complications: No apparent anesthesia complications

## 2014-02-23 ENCOUNTER — Encounter (HOSPITAL_COMMUNITY): Payer: Self-pay | Admitting: General Surgery

## 2015-03-27 ENCOUNTER — Encounter (HOSPITAL_COMMUNITY): Payer: Self-pay | Admitting: Emergency Medicine

## 2015-03-27 ENCOUNTER — Emergency Department (HOSPITAL_COMMUNITY)
Admission: EM | Admit: 2015-03-27 | Discharge: 2015-03-27 | Disposition: A | Discharge status: Home / Self Care | Payer: Medicaid Other | Attending: Emergency Medicine | Admitting: Emergency Medicine

## 2015-03-27 DIAGNOSIS — Y9289 Other specified places as the place of occurrence of the external cause: Secondary | ICD-10-CM | POA: Insufficient documentation

## 2015-03-27 DIAGNOSIS — Z792 Long term (current) use of antibiotics: Secondary | ICD-10-CM | POA: Insufficient documentation

## 2015-03-27 DIAGNOSIS — W57XXXA Bitten or stung by nonvenomous insect and other nonvenomous arthropods, initial encounter: Secondary | ICD-10-CM | POA: Insufficient documentation

## 2015-03-27 DIAGNOSIS — Z791 Long term (current) use of non-steroidal anti-inflammatories (NSAID): Secondary | ICD-10-CM | POA: Insufficient documentation

## 2015-03-27 DIAGNOSIS — Y9389 Activity, other specified: Secondary | ICD-10-CM | POA: Insufficient documentation

## 2015-03-27 DIAGNOSIS — R51 Headache: Secondary | ICD-10-CM | POA: Insufficient documentation

## 2015-03-27 DIAGNOSIS — K219 Gastro-esophageal reflux disease without esophagitis: Secondary | ICD-10-CM | POA: Insufficient documentation

## 2015-03-27 DIAGNOSIS — Y998 Other external cause status: Secondary | ICD-10-CM | POA: Insufficient documentation

## 2015-03-27 DIAGNOSIS — L03113 Cellulitis of right upper limb: Secondary | ICD-10-CM

## 2015-03-27 DIAGNOSIS — Z87891 Personal history of nicotine dependence: Secondary | ICD-10-CM | POA: Insufficient documentation

## 2015-03-27 MED ORDER — CEPHALEXIN 500 MG PO CAPS: 500.0000 mg | ORAL_CAPSULE | Freq: Four times a day (QID) | ORAL | Status: DC

## 2015-03-27 MED ORDER — SULFAMETHOXAZOLE-TRIMETHOPRIM 800-160 MG PO TABS: 1.0000 | ORAL_TABLET | Freq: Three times a day (TID) | ORAL | Status: AC

## 2015-03-27 NOTE — Discharge Instructions (Signed)

## 2015-03-27 NOTE — ED Provider Notes (Signed)
CSN: [id number]     Arrival date & time 03/27/15  0708 History   First MD Initiated Contact with Patient 03/27/15 [phone]     Chief Complaint  Patient presents with  . Insect Bite     (Consider location/radiation/quality/duration/timing/severity/associated sxs/prior Treatment) The history is provided by the patient.   patient presents with a reported insect bite to her right upper arm. States it started on Friday and has worsened over the last few days. Today is Wednesday. No fevers. She states she did not see anything provider. No fevers. No myalgias. She states she does have a headache. She tends to get headaches but this one is a little more severe. No previous abscesses.  Past Medical History  Diagnosis Date  . GERD (gastroesophageal reflux disease)    Past Surgical History  Procedure Laterality Date  . Tubal ligation    . Tympanostomy tube placement Bilateral   . Cholecystectomy N/A 02/21/2014    Procedure: LAPAROSCOPIC CHOLECYSTECTOMY;  Surgeon: Dalia HeadingMark A Jenkins, MD;  Location: [facility];  Service: General;  Laterality: N/A;   History reviewed. No pertinent family history. History  Substance Use Topics  . Smoking status: Former Smoker -- 0.50 packs/day for 3 years    Types: Cigarettes    Quit date: 02/20/1998  . Smokeless tobacco: Not on file  . Alcohol Use: No   OB History    No data available     Review of Systems  Constitutional: Negative for fever.  Respiratory: Negative for shortness of breath.   Cardiovascular: Negative for chest pain.  Musculoskeletal: Negative for back pain.  Skin: Positive for color change and wound.  Neurological: Positive for headaches.      Allergies  Tramadol  Home Medications   Prior to Admission medications   Medication Sig Start Date End Date Taking? Authorizing Provider  cephALEXin (KEFLEX) 500 MG capsule Take 1 capsule (500 mg total) by mouth 4 (four) times daily. 03/27/15   Benjiman CoreNathan Milessa Hogan, MD  ciprofloxacin (CIPRO) 500 MG  tablet Take 1 tablet (500 mg total) by mouth 2 (two) times daily. One po bid x 7 days 02/17/14   Bethann BerkshireJoseph Zammit, MD  diphenhydrAMINE (BENADRYL) 25 mg capsule Take 25 mg by mouth every 6 (six) hours as needed for allergies.    Historical Provider, MD  ibuprofen (ADVIL,MOTRIN) 800 MG tablet Take 1 tablet (800 mg total) by mouth 3 (three) times daily. 02/08/14   Ivery QualeHobson Bryant, PA-C  oxyCODONE-acetaminophen (PERCOCET) 7.5-325 MG per tablet Take 1-2 tablets by mouth every 4 (four) hours as needed. 02/21/14   Franky MachoMark Jenkins Md, MD  promethazine (PHENERGAN) 25 MG tablet Take 1 tablet (25 mg total) by mouth every 6 (six) hours as needed for nausea or vomiting. 02/17/14   Bethann BerkshireJoseph Zammit, MD  ranitidine (ZANTAC) 75 MG tablet Take 75 mg by mouth daily as needed for heartburn.     Historical Provider, MD  sulfamethoxazole-trimethoprim (BACTRIM DS,SEPTRA DS) 800-160 MG per tablet Take 1 tablet by mouth 3 (three) times daily. 03/27/15 04/03/15  Benjiman CoreNathan Lavenia Stumpo, MD   BP 107/71 mmHg  Pulse 70  Temp(Src) 98.3 F (36.8 C)  Resp 18  Ht 5\' 3"  (1.6 m)  Wt 200 lb (90.719 kg)  BMI 35.44 kg/m2  SpO2 98%  LMP 03/10/2015 Physical Exam  Constitutional: She appears well-developed and well-nourished.  Cardiovascular: Normal rate.   Skin:  On the right upper arm posteriorly there is a tender indurated area. There is a central area approximately 4 cm across that is more  red and more forearm. There is no fluctuance. Surrounding this there is another more swollen area is approximately 10 cm across. Neurovascularly intact distally.    ED Course  Procedures (including critical care time) Labs Review Labs Reviewed - No data to display  Imaging Review No results found.   EKG Interpretation None      MDM   Final diagnoses:  Cellulitis of right upper extremity    Patient with cellulitis of upper arm. No abscess seen. We'll treat with antibiotics. Also is dull headache. Does not appear to need further evaluation for that  at this time.    Benjiman CoreNathan Yalexa Blust, MD 03/27/15 [phone]

## 2015-03-27 NOTE — ED Notes (Signed)
MD at bedside. 

## 2015-03-27 NOTE — ED Notes (Signed)
Pt c/o insect bite to right upper arm. Pt also c/o ha. nad noted.

## 2015-10-09 ENCOUNTER — Emergency Department (HOSPITAL_COMMUNITY)
Admission: EM | Admit: 2015-10-09 | Discharge: 2015-10-09 | Disposition: A | Discharge status: Home / Self Care | Payer: 59 | Attending: Emergency Medicine | Admitting: Emergency Medicine

## 2015-10-09 ENCOUNTER — Encounter (HOSPITAL_COMMUNITY): Payer: Self-pay | Admitting: Emergency Medicine

## 2015-10-09 ENCOUNTER — Emergency Department (HOSPITAL_COMMUNITY): Payer: 59

## 2015-10-09 DIAGNOSIS — Z87891 Personal history of nicotine dependence: Secondary | ICD-10-CM | POA: Insufficient documentation

## 2015-10-09 DIAGNOSIS — R110 Nausea: Secondary | ICD-10-CM | POA: Insufficient documentation

## 2015-10-09 DIAGNOSIS — R079 Chest pain, unspecified: Secondary | ICD-10-CM | POA: Diagnosis present

## 2015-10-09 DIAGNOSIS — R51 Headache: Secondary | ICD-10-CM | POA: Insufficient documentation

## 2015-10-09 DIAGNOSIS — R0789 Other chest pain: Secondary | ICD-10-CM | POA: Diagnosis not present

## 2015-10-09 DIAGNOSIS — K219 Gastro-esophageal reflux disease without esophagitis: Secondary | ICD-10-CM | POA: Insufficient documentation

## 2015-10-09 DIAGNOSIS — R0602 Shortness of breath: Secondary | ICD-10-CM | POA: Insufficient documentation

## 2015-10-09 LAB — CBC
HEMATOCRIT: 42.6 % (ref 36.0–46.0)
Hemoglobin: 14.9 g/dL (ref 12.0–15.0)
MCH: 32.1 pg (ref 26.0–34.0)
MCHC: 35 g/dL (ref 30.0–36.0)
MCV: 91.8 fL (ref 78.0–100.0)
PLATELETS: 281 10*3/uL (ref 150–400)
RBC: 4.64 MIL/uL (ref 3.87–5.11)
RDW: 12.8 % (ref 11.5–15.5)
WBC: 11.2 10*3/uL — ABNORMAL HIGH (ref 4.0–10.5)

## 2015-10-09 LAB — COMPREHENSIVE METABOLIC PANEL
ALT: 20 U/L (ref 14–54)
ANION GAP: 10 (ref 5–15)
AST: 24 U/L (ref 15–41)
Albumin: 3.9 g/dL (ref 3.5–5.0)
Alkaline Phosphatase: 80 U/L (ref 38–126)
BUN: 8 mg/dL (ref 6–20)
CHLORIDE: 105 mmol/L (ref 101–111)
CO2: 21 mmol/L — ABNORMAL LOW (ref 22–32)
Calcium: 9.4 mg/dL (ref 8.9–10.3)
Creatinine, Ser: 0.77 mg/dL (ref 0.44–1.00)
GFR calc Af Amer: >60 mL/min (ref >60)
Glucose, Bld: 98 mg/dL (ref 65–99)
Potassium: 3.6 mmol/L (ref 3.5–5.1)
Sodium: 136 mmol/L (ref 135–145)
TOTAL PROTEIN: 8.4 g/dL — AB (ref 6.5–8.1)
Total Bilirubin: 0.6 mg/dL (ref 0.3–1.2)

## 2015-10-09 LAB — I-STAT TROPONIN, ED: TROPONIN I, POC: 0 ng/mL (ref 0.00–0.08)

## 2015-10-09 MED ORDER — NAPROXEN 250 MG PO TABS
500.0000 mg | ORAL_TABLET | Freq: Once | ORAL | Status: AC
  Administered 2015-10-09: 500 mg via ORAL
  Filled 2015-10-09: qty 2

## 2015-10-09 MED ORDER — ASPIRIN 81 MG PO CHEW
324.0000 mg | CHEWABLE_TABLET | Freq: Once | ORAL | Status: AC
  Administered 2015-10-09: 324 mg via ORAL
  Filled 2015-10-09: qty 4

## 2015-10-09 MED ORDER — ONDANSETRON 4 MG PO TBDP
4.0000 mg | ORAL_TABLET | Freq: Once | ORAL | Status: AC
  Administered 2015-10-09: 4 mg via ORAL
  Filled 2015-10-09: qty 1

## 2015-10-09 MED ORDER — NAPROXEN 500 MG PO TABS: 500.0000 mg | ORAL_TABLET | Freq: Two times a day (BID) | ORAL | Status: DC

## 2015-10-09 MED ORDER — FAMOTIDINE 20 MG PO TABS
40.0000 mg | ORAL_TABLET | Freq: Once | ORAL | Status: AC
  Administered 2015-10-09: 40 mg via ORAL
  Filled 2015-10-09: qty 2

## 2015-10-09 NOTE — ED Provider Notes (Signed)
(CSN: [id number]   Arrival date & time 10/09/15 2000  History  By signing my name below, I, Bethel BornBritney McCollum, attest that this documentation has been prepared under the direction and in the presence of Vanetta MuldersScott Rylen Swindler, MD. Electronically Signed: Bethel BornBritney McCollum, ED Scribe. 10/09/2015. 8:50 PM.  Chief Complaint  Patient presents with  . Chest Pain    HPI Patient is a 39 y.o. female presenting with chest pain. The history is provided by the patient. No language interpreter was used.  Chest Pain Pain location:  Substernal area and L chest Pain quality: sharp   Pain radiates to:  L shoulder and L arm Pain radiates to the back: no   Pain severity:  Moderate Onset quality:  Sudden Duration:  1 hour Timing:  Constant Progression:  Unchanged Chronicity:  New Relieved by:  Nothing Worsened by:  Movement Ineffective treatments:  None tried Associated symptoms: headache and nausea   Associated symptoms: no abdominal pain, no back pain, no cough, no fever and not vomiting    Summer [name] is a 39 y.o. female who presents to the Emergency Department complaining of new, constant,  7-/10 in severity, sharp, left substernal chest pain with onset 1 hour ago. The pain radiates to the left shoulder and arm and is mildly exacerbated by moving the left arm. Associated symptoms include SOB and nausea. Pt denies cough and vomiting. [facility]   Past Medical History  Diagnosis Date  . GERD (gastroesophageal reflux disease)     Past Surgical History  Procedure Laterality Date  . Tubal ligation    . Tympanostomy tube placement Bilateral   . Cholecystectomy N/A 02/21/2014    Procedure: LAPAROSCOPIC CHOLECYSTECTOMY;  Surgeon: Dalia HeadingMark A Jenkins, MD;  Location: [facility];  Service: General;  Laterality: N/A;    History reviewed. No pertinent family history.  Social History  Substance Use Topics  . Smoking status: Former Smoker -- 0.50 packs/day for 3 years    Types: Cigarettes     Quit date: 02/20/1998  . Smokeless tobacco: None  . Alcohol Use: No     Review of Systems  Constitutional: Negative for fever and chills.  HENT: Negative for rhinorrhea and sore throat.   Eyes: Negative for visual disturbance.  Respiratory: Negative for cough.   Cardiovascular: Positive for chest pain. Negative for leg swelling.  Gastrointestinal: Positive for nausea. Negative for vomiting, abdominal pain and diarrhea.  Genitourinary: Negative for dysuria.  Musculoskeletal: Negative for back pain.  Skin: Negative for rash.  Neurological: Positive for headaches.  Hematological: Does not bruise/bleed easily.   Home Medications   Prior to Admission medications   Medication Sig Start Date End Date Taking? Authorizing Provider  ibuprofen (ADVIL,MOTRIN) 200 MG tablet Take 200 mg by mouth every 6 (six) hours as needed for mild pain.   Yes Historical Provider, MD  cephALEXin (KEFLEX) 500 MG capsule Take 1 capsule (500 mg total) by mouth 4 (four) times daily. Patient not taking: Reported on 10/09/2015 03/27/15   Benjiman CoreNathan Pickering, MD  ciprofloxacin (CIPRO) 500 MG tablet Take 1 tablet (500 mg total) by mouth 2 (two) times daily. One po bid x 7 days Patient not taking: Reported on 10/09/2015 02/17/14   Bethann BerkshireJoseph Zammit, MD  diphenhydrAMINE (BENADRYL) 25 mg capsule Take 25 mg by mouth every 6 (six) hours as needed for allergies.    Historical Provider, MD  ibuprofen (ADVIL,MOTRIN) 800 MG tablet Take 1 tablet (800 mg total) by mouth 3 (three) times daily. Patient  not taking: Reported on 10/09/2015 02/08/14   Ivery Quale, PA-C  naproxen (NAPROSYN) 500 MG tablet Take 1 tablet (500 mg total) by mouth 2 (two) times daily. 10/09/15   Vanetta Mulders, MD  oxyCODONE-acetaminophen (PERCOCET) 7.5-325 MG per tablet Take 1-2 tablets by mouth every 4 (four) hours as needed. Patient not taking: Reported on 10/09/2015 02/21/14   Franky Macho, MD  promethazine (PHENERGAN) 25 MG tablet Take 1 tablet (25 mg total) by  mouth every 6 (six) hours as needed for nausea or vomiting. Patient not taking: Reported on 10/09/2015 02/17/14   Bethann Berkshire, MD  ranitidine (ZANTAC) 75 MG tablet Take 75 mg by mouth daily as needed for heartburn.     Historical Provider, MD    Allergies  Tramadol  Triage Vitals: BP 108/60 mmHg  Pulse 89  Temp(Src) 97.9 F (36.6 C) (Oral)  Resp 20  Ht  (1.6 m)  Wt 210 lb (95.255 kg)  BMI 37.21 kg/m2  SpO2 98%  LMP 09/03/2015  Physical Exam  Constitutional: She is oriented to person, place, and time. She appears well-developed and well-nourished.  HENT:  Head: Normocephalic.  Eyes: EOM are normal. Pupils are equal, round, and reactive to light. No scleral icterus.  Neck: Normal range of motion.  Cardiovascular: Normal rate and regular rhythm.   Pulmonary/Chest: Effort normal. She exhibits no tenderness.  CTAB Room air Sats are 97-100%  Abdominal: Bowel sounds are normal. There is no tenderness.  Musculoskeletal: Normal range of motion. She exhibits no edema.  Neurological: She is alert and oriented to person, place, and time. No cranial nerve deficit. She exhibits normal muscle tone. Coordination normal.  Psychiatric: She has a normal mood and affect.  Nursing note and vitals reviewed.   ED Course  Procedures   DIAGNOSTIC STUDIES: Oxygen Saturation is 98% on RA, normal by my interpretation.    COORDINATION OF CARE: 8:47 PM Discussed treatment plan which includes CXR, EKG, lab work, aspirin, and pain management with pt at bedside and pt agreed to plan.  Results for orders placed or performed during the hospital encounter of 10/09/15  CBC  Result Value Ref Range   WBC 11.2 (H) 4.0 - 10.5 K/uL   RBC 4.64 3.87 - 5.11 MIL/uL   Hemoglobin 14.9 12.0 - 15.0 g/dL   HCT 40.9 81.1 - 91.4 %   MCV 91.8 78.0 - 100.0 fL   MCH 32.1 26.0 - 34.0 pg   MCHC 35.0 30.0 - 36.0 g/dL   RDW 78.2 95.6 - 21.3 %   Platelets 281 150 - 400 K/uL  Comprehensive metabolic panel  Result  Value Ref Range   Sodium 136 135 - 145 mmol/L   Potassium 3.6 3.5 - 5.1 mmol/L   Chloride 105 101 - 111 mmol/L   CO2 21 (L) 22 - 32 mmol/L   Glucose, Bld 98 65 - 99 mg/dL   BUN 8 6 - 20 mg/dL   Creatinine, Ser 0.86 0.44 - 1.00 mg/dL   Calcium 9.4 8.9 - 57.8 mg/dL   Total Protein 8.4 (H) 6.5 - 8.1 g/dL   Albumin 3.9 3.5 - 5.0 g/dL   AST 24 15 - 41 U/L   ALT 20 14 - 54 U/L   Alkaline Phosphatase 80 38 - 126 U/L   Total Bilirubin 0.6 0.3 - 1.2 mg/dL   GFR calc non Af Amer >60 >60 mL/min   GFR calc Af Amer >60 >60 mL/min   Anion gap 10 5 - 15  I-stat troponin,  ED (not at [facility])  Result Value Ref Range   Troponin i, poc 0.00 0.00 - 0.08 ng/mL   Comment 3           Dg Chest 2 View  10/09/2015  CLINICAL DATA:  Left-sided chest pain EXAM: CHEST  2 VIEW COMPARISON:  01/04/2011 FINDINGS: The heart size and mediastinal contours are within normal limits. Both lungs are clear. The visualized skeletal structures are unremarkable. IMPRESSION: No active cardiopulmonary disease. Electronically Signed   By: Elige Ko   On: 10/09/2015 20:40     I personally reviewed and evaluated these images and lab results as a part of my medical decision-making.   EKG Interpretation  Date/Time:  Wednesday October 09 2015 20:12:34 EST Ventricular Rate:  88 PR Interval:  142 QRS Duration: 79 QT Interval:  369 QTC Calculation: 446 R Axis:   12 Text Interpretation:  Sinus rhythm Low voltage, precordial leads No previous ECGs available Confirmed by Maritza Hosterman  MD, Soley Harriss ([id number]) on 10/09/2015 8:19:47 PM    MDM   Final diagnoses:  Chest wall pain    A symptoms seem to be consistent with chest wall pain. Workup without any acute findings chest x-ray negative for pneumonia pulmonary edema or pneumothorax. Troponin negative EKG without any acute changes. Symptoms are worse with the movement of the left arm and also reproducible with palpation to the left chest area. Take the Naprosyn as directed. Make  appointment to follow-up with your doctors. Return for any new or worse symptoms.     I personally performed the services described in this documentation, which was scribed in my presence. The recorded information has been reviewed and is accurate.      Vanetta Mulders, MD 10/09/15 2124

## 2015-10-09 NOTE — Discharge Instructions (Signed)
Chest Wall Pain Chest wall pain is pain in or around the bones and muscles of your chest. Sometimes, an injury causes this pain. Sometimes, the cause may not be known. This pain may take several weeks or longer to get better. HOME CARE Pay attention to any changes in your symptoms. Take these actions to help with your pain:  Rest as told by your doctor.  Avoid activities that cause pain. Try not to use your chest, belly (abdominal), or side muscles to lift heavy things.  If directed, apply ice to the painful area:  Put ice in a plastic bag.  Place a towel between your skin and the bag.  Leave the ice on for 20 minutes, 2-3 times per day.  Take over-the-counter and prescription medicines only as told by your doctor.  Do not use tobacco products, including cigarettes, chewing tobacco, and e-cigarettes. If you need help quitting, ask your doctor.  Keep all follow-up visits as told by your doctor. This is important. GET HELP IF:  You have a fever.  Your chest pain gets worse.  You have new symptoms. GET HELP RIGHT AWAY IF:  You feel sick to your stomach (nauseous) or you throw up (vomit).  You feel sweaty or light-headed.  You have a cough with phlegm (sputum) or you cough up blood.  You are short of breath.   This information is not intended to replace advice given to you by your health care provider. Make sure you discuss any questions you have with your health care provider.   Take the Naprosyn as directed. Return for any new or worse symptoms. [facility] if not improved in a couple days.   Document Released: 04/13/2008 Document Revised: 07/17/2015 Document Reviewed: 01/21/2015 Elsevier Interactive Patient Education [employer]2016 [employer]

## 2015-10-09 NOTE — ED Notes (Signed)
Pt states that she was having problems with her daughter when the chest pain started, had some SOB at time ( which has eased up ) , some nausea and pain radiating into lt arm and slightly up into her neck

## 2015-12-16 ENCOUNTER — Ambulatory Visit (INDEPENDENT_AMBULATORY_CARE_PROVIDER_SITE_OTHER): Payer: 59 | Admitting: Family Medicine

## 2015-12-16 ENCOUNTER — Encounter: Payer: Self-pay | Admitting: Family Medicine

## 2015-12-16 VITALS — BP 107/78 | HR 94 | Temp 98.5°F | Ht 63.0 in | Wt 236.2 lb

## 2015-12-16 DIAGNOSIS — J189 Pneumonia, unspecified organism: Secondary | ICD-10-CM | POA: Diagnosis not present

## 2015-12-16 MED ORDER — GUAIFENESIN-CODEINE 100-10 MG/5ML PO SOLN
5.0000 mL | Freq: Three times a day (TID) | ORAL | Status: DC | PRN
Start: 2015-12-16 — End: 2016-01-17

## 2015-12-16 MED ORDER — AMOXICILLIN-POT CLAVULANATE 875-125 MG PO TABS: 1.0000 | ORAL_TABLET | Freq: Two times a day (BID) | ORAL | Status: DC

## 2015-12-16 NOTE — Progress Notes (Signed)
   HPI  Patient presents today here with cough and cold to establish care.  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   She describes an illness about a week and half ago with cough and congestion that wasn't as severe. She resolved and got better for about one week. Starting about 4 days ago she developed cough, chills, sore throat, and malaise. She's missed work today due to the illness. She describes central chest pain with cough She also describes persistent cough with any movement.  She is nonsmoker. She has had a few episodes of blood-tinged sputum, she's also had nosebleeds during this time.    PMH: Smoking status noted Her past medical, surgical, social, family history reviewed and updated in EMR ROS: Per HPI  Objective: BP 107/78 mmHg  Pulse 94  Temp(Src) 98.5 F (36.9 C) (Oral)  Ht  (1.6 m)  Wt 236 lb 3.2 oz (107.14 kg)  BMI 41.85 kg/m2 Gen: NAD, alert, cooperative with exam HEENT: NCAT, TMs normal bilaterally, nares clear, oropharynx with swollen tonsils but no exudates or erythema CV: RRR, good S1/S2, no murmur Resp: Nonlabored, good air movement, coarse breath sounds in bilateral bases Abd: SNTND, BS present, no guarding or organomegaly Ext: No edema, warm Neuro: Alert and oriented, No gross deficits  Assessment and plan:  # Community acquired pneumonia Clinical diagnosis Treating with Augmentin given the fact that this is the second sickness, also the severity of her illness with malaise. Lung exam overall reassuring that she does have some coarse breath sounds at the bases Codeine cough syrup for nighttime cough Discussed supportive care Out of work for 3 days.  RTC in 1-2 months for physical.     Meds ordered this encounter  Medications  . amoxicillin-clavulanate (AUGMENTIN) 875-125 MG tablet    Sig: Take 1 tablet by mouth 2 (two) times daily.   Dispense:  20 tablet    Refill:  0  . guaiFENesin-codeine 100-10 MG/5ML syrup    Sig: Take 5-10 mLs by mouth 3 (three) times daily as needed for cough.    Dispense:  120 mL    Refill:  0    Murtis Sink, MD [facility] [place] 12/16/2015, 2:22 PM

## 2015-12-16 NOTE — Patient Instructions (Signed)
Great to meet you!  Come back if you have any concerns  Lets plan to se you on 1-2 months for a physical.   Use Robitussin DM or Mucinex DM for cough Codeine is a narcotic, do not drive or go top work after taking it.   Community-Acquired Pneumonia, Adult Pneumonia is an infection of the lungs. There are different types of pneumonia. One type can develop while a person is in a hospital. A different type, called community-acquired pneumonia, develops in people who are not, or have not recently been, in the hospital or other health care facility.  CAUSES Pneumonia may be caused by bacteria, viruses, or funguses. Community-acquired pneumonia is often caused by Streptococcus pneumonia bacteria. These bacteria are often passed from one person to another by breathing in droplets from the cough or sneeze of an infected person. RISK FACTORS The condition is more likely to develop in:  People who havechronic diseases, such as chronic obstructive pulmonary disease (COPD), asthma, congestive heart failure, cystic fibrosis, diabetes, or kidney disease.  People who haveearly-stage or late-stage HIV.  People who havesickle cell disease.  People who havehad their spleen removed (splenectomy).  People who havepoor Administrator.  People who havemedical conditions that increase the risk of breathing in (aspirating) secretions their own mouth and nose.   People who havea weakened immune system (immunocompromised).  People who smoke.  People whotravel to areas where pneumonia-causing germs commonly exist.  People whoare around animal habitats or animals that have pneumonia-causing germs, including birds, bats, rabbits, cats, and farm animals. SYMPTOMS Symptoms of this condition include:  Adry cough.  A wet (productive) cough.  Fever.  Sweating.  Chest pain, especially when breathing deeply or coughing.  Rapid breathing or difficulty breathing.  Shortness of  breath.  Shaking chills.  Fatigue.  Muscle aches. DIAGNOSIS Your health care provider will take a medical history and perform a physical exam. You may also have other tests, including:  Imaging studies of your chest, including X-rays.  Tests to check your blood oxygen level and other blood gases.  Other tests on blood, mucus (sputum), fluid around your lungs (pleural fluid), and urine. If your pneumonia is severe, other tests may be done to identify the specific cause of your illness. TREATMENT The type of treatment that you receive depends on many factors, such as the cause of your pneumonia, the medicines you take, and other medical conditions that you have. For most adults, treatment and recovery from pneumonia may occur at home. In some cases, treatment must happen in a hospital. Treatment may include:  Antibiotic medicines, if the pneumonia was caused by bacteria.  Antiviral medicines, if the pneumonia was caused by a virus.  Medicines that are given by mouth or through an IV tube.  Oxygen.  Respiratory therapy. Although rare, treating severe pneumonia may include:  Mechanical ventilation. This is done if you are not breathing well on your own and you cannot maintain a safe blood oxygen level.  Thoracentesis. This procedureremoves fluid around one lung or both lungs to help you breathe better. HOME CARE INSTRUCTIONS  Take over-the-counter and prescription medicines only as told by your health care provider.  Only takecough medicine if you are losing sleep. Understand that cough medicine can prevent your body's natural ability to remove mucus from your lungs.  If you were prescribed an antibiotic medicine, take it as told by your health care provider. Do not stop taking the antibiotic even if you start to feel better.  Sleep  in a semi-upright position at night. Try sleeping in a reclining chair, or place a few pillows under your head.  Do not use tobacco products,  including cigarettes, chewing tobacco, and e-cigarettes. If you need help quitting, ask your health care provider.  Drink enough water to keep your urine clear or pale yellow. This will help to thin out mucus secretions in your lungs. PREVENTION There are ways that you can decrease your risk of developing community-acquired pneumonia. Consider getting a pneumococcal vaccine if:  You are older than 40 years of age.  You are older than 40 years of age and are undergoing cancer treatment, have chronic lung disease, or have other medical conditions that affect your immune system. Ask your health care provider if this applies to you. There are different types and schedules of pneumococcal vaccines. Ask your health care provider which vaccination option is best for you. You may also prevent community-acquired pneumonia if you take these actions:  Get an influenza vaccine every year. Ask your health care provider which type of influenza vaccine is best for you.  Go to the dentist on a regular basis.  Wash your hands often. Use hand sanitizer if soap and water are not available. SEEK MEDICAL CARE IF:  You have a fever.  You are losing sleep because you cannot control your cough with cough medicine. SEEK IMMEDIATE MEDICAL CARE IF:  You have worsening shortness of breath.  You have increased chest pain.  Your sickness becomes worse, especially if you are an older adult or have a weakened immune system.  You cough up blood.   This information is not intended to replace advice given to you by your health care provider. Make sure you discuss any questions you have with your health care provider.   Document Released: 10/26/2005 Document Revised: 07/17/2015 Document Reviewed: 02/20/2015 Elsevier Interactive Patient Education [employer]

## 2016-01-14 ENCOUNTER — Ambulatory Visit: Payer: 59 | Admitting: Family Medicine

## 2016-01-17 ENCOUNTER — Encounter: Payer: Self-pay | Admitting: Nurse Practitioner

## 2016-01-17 ENCOUNTER — Ambulatory Visit: Payer: 59 | Admitting: Family Medicine

## 2016-01-17 ENCOUNTER — Ambulatory Visit (INDEPENDENT_AMBULATORY_CARE_PROVIDER_SITE_OTHER): Payer: 59 | Admitting: Nurse Practitioner

## 2016-01-17 VITALS — BP 101/73 | HR 77 | Temp 97.8°F | Ht 63.0 in | Wt 237.0 lb

## 2016-01-17 DIAGNOSIS — Z0000 Encounter for general adult medical examination without abnormal findings: Secondary | ICD-10-CM

## 2016-01-17 DIAGNOSIS — K219 Gastro-esophageal reflux disease without esophagitis: Secondary | ICD-10-CM | POA: Insufficient documentation

## 2016-01-17 DIAGNOSIS — F411 Generalized anxiety disorder: Secondary | ICD-10-CM

## 2016-01-17 MED ORDER — CITALOPRAM HYDROBROMIDE 20 MG PO TABS: 20.0000 mg | ORAL_TABLET | Freq: Every day | ORAL | Status: DC

## 2016-01-17 MED ORDER — ALPRAZOLAM 0.5 MG PO TABS: 0.5000 mg | ORAL_TABLET | Freq: Two times a day (BID) | ORAL | Status: DC | PRN

## 2016-01-17 MED ORDER — RANITIDINE HCL 75 MG PO TABS: 75.0000 mg | ORAL_TABLET | Freq: Every day | ORAL | Status: DC | PRN

## 2016-01-17 NOTE — Patient Instructions (Signed)
Stress and Stress Management Stress is a normal reaction to life events. It is what you feel when life demands more than you are used to or more than you can handle. Some stress can be useful. For example, the stress reaction can help you catch the last bus of the day, study for a test, or meet a deadline at work. But stress that occurs too often or for too long can cause problems. It can affect your emotional health and interfere with relationships and normal daily activities. Too much stress can weaken your immune system and increase your risk for physical illness. If you already have a medical problem, stress can make it worse. CAUSES  All sorts of life events may cause stress. An event that causes stress for one person may not be stressful for another person. Major life events commonly cause stress. These may be positive or negative. Examples include losing your job, moving into a new home, getting married, having a baby, or losing a loved one. Less obvious life events may also cause stress, especially if they occur day after day or in combination. Examples include working long hours, driving in traffic, caring for children, being in debt, or being in a difficult relationship. SIGNS AND SYMPTOMS Stress may cause emotional symptoms including, the following:  Anxiety. This is feeling worried, afraid, on edge, overwhelmed, or out of control.  Anger. This is feeling irritated or impatient.  Depression. This is feeling sad, down, helpless, or guilty.  Difficulty focusing, remembering, or making decisions. Stress may cause physical symptoms, including the following:   Aches and pains. These may affect your head, neck, back, stomach, or other areas of your body.  Tight muscles or clenched jaw.  Low energy or trouble sleeping. Stress may cause unhealthy behaviors, including the following:   Eating to feel better (overeating) or skipping meals.  Sleeping too little, too much, or both.  Working  too much or putting off tasks (procrastination).  Smoking, drinking alcohol, or using drugs to feel better. DIAGNOSIS  Stress is diagnosed through an assessment by your health care provider. Your health care provider will ask questions about your symptoms and any stressful life events.Your health care provider will also ask about your medical history and may order blood tests or other tests. Certain medical conditions and medicine can cause physical symptoms similar to stress. Mental illness can cause emotional symptoms and unhealthy behaviors similar to stress. Your health care provider may refer you to a mental health professional for further evaluation.  TREATMENT  Stress management is the recommended treatment for stress.The goals of stress management are reducing stressful life events and coping with stress in healthy ways.  Techniques for reducing stressful life events include the following:  Stress identification. Self-monitor for stress and identify what causes stress for you. These skills may help you to avoid some stressful events.  Time management. Set your priorities, keep a calendar of events, and learn to say "no." These tools can help you avoid making too many commitments. Techniques for coping with stress include the following:  Rethinking the problem. Try to think realistically about stressful events rather than ignoring them or overreacting. Try to find the positives in a stressful situation rather than focusing on the negatives.  Exercise. Physical exercise can release both physical and emotional tension. The key is to find a form of exercise you enjoy and do it regularly.  Relaxation techniques. These relax the body and mind. Examples include yoga, meditation, tai chi, biofeedback, deep  breathing, progressive muscle relaxation, listening to music, being out in nature, journaling, and other hobbies. Again, the key is to find one or more that you enjoy and can do  regularly.  Healthy lifestyle. Eat a balanced diet, get plenty of sleep, and do not smoke. Avoid using alcohol or drugs to relax.  Strong support network. Spend time with family, friends, or other people you enjoy being around.Express your feelings and talk things over with someone you trust. Counseling or talktherapy with a mental health professional may be helpful if you are having difficulty managing stress on your own. Medicine is typically not recommended for the treatment of stress.Talk to your health care provider if you think you need medicine for symptoms of stress. HOME CARE INSTRUCTIONS  Keep all follow-up visits as directed by your health care provider.  Take all medicines as directed by your health care provider. SEEK MEDICAL CARE IF:  Your symptoms get worse or you start having new symptoms.  You feel overwhelmed by your problems and can no longer manage them on your own. SEEK IMMEDIATE MEDICAL CARE IF:  You feel like hurting yourself or someone else.   This information is not intended to replace advice given to you by your health care provider. Make sure you discuss any questions you have with your health care provider.   Document Released: 04/21/2001 Document Revised: 11/16/2014 Document Reviewed: 06/20/2013 Elsevier Interactive Patient Education 2016 Elsevier Inc.  

## 2016-01-17 NOTE — Progress Notes (Signed)
   Subjective:    Patient ID: Summer [name], female    DOB: [birth year], 40 y.o.   MRN: [id number]  HPI Patient here today for CPE with no PAP- her only current medical problem is GERD in which she takes zantac for. She is also on xanax for anxiety. SHe is doing well today without complaints.    Review of Systems  Constitutional: Negative.   HENT: Negative.   Respiratory: Negative.   Cardiovascular: Negative.   Genitourinary: Negative.   Neurological: Negative.   Psychiatric/Behavioral: Negative.   All other systems reviewed and are negative.      Objective:   Physical Exam  Constitutional: She is oriented to person, place, and time. She appears well-developed and well-nourished.  HENT:  Head: Normocephalic.  Right Ear: Hearing, tympanic membrane, external ear and ear canal normal.  Left Ear: Hearing, tympanic membrane, external ear and ear canal normal.  Nose: Nose normal.  Mouth/Throat: Uvula is midline, oropharynx is clear and moist and mucous membranes are normal.  Eyes: Conjunctivae and EOM are normal. Pupils are equal, round, and reactive to light.  Neck: Normal range of motion. Neck supple. No JVD present. No thyromegaly present.  Cardiovascular: Normal rate, normal heart sounds and intact distal pulses.   No murmur heard. Pulmonary/Chest: Effort normal and breath sounds normal. She has no wheezes. She has no rales.  Abdominal: Soft. Bowel sounds are normal. She exhibits no mass.  Musculoskeletal: Normal range of motion.  Neurological: She is alert and oriented to person, place, and time. She has normal reflexes.  Skin: Skin is warm and dry.  Psychiatric: She has a normal mood and affect. Her behavior is normal. Judgment and thought content normal.   BP 101/73 mmHg  Pulse 77  Temp(Src) 97.8 F (36.6 C) (Oral)  Ht 5' 3" (1.6 m)  Wt 237 lb (107.502 kg)  BMI 41.99 kg/m2       Assessment & Plan:  1. Annual physical exam - CBC with Differential/Platelet -  CMP14+EGFR - Lipid panel - Thyroid Panel With TSH  2. Gastroesophageal reflux disease without esophagitis Avoid spicy foods Do not eat 2 hours prior to bedtime - ranitidine (ZANTAC) 75 MG tablet; Take 1 tablet (75 mg total) by mouth daily as needed for heartburn.  Dispense: 30 tablet; Refill: 5  3. GAD (generalized anxiety disorder) Stress management Started on celexa - side effects discussed - ALPRAZolam (XANAX) 0.5 MG tablet; Take 1 tablet (0.5 mg total) by mouth 2 (two) times daily as needed for anxiety.  Dispense: 60 tablet; Refill: 2 - citalopram (CELEXA) 20 MG tablet; Take 1 tablet (20 mg total) by mouth daily.  Dispense: 30 tablet; Refill: 5  4. Morbid obesity, unspecified obesity type ([employer]) Discussed diet and exercise for person with BMI >25 Will recheck weight in 3-6 months     Labs pending Health maintenance reviewed Diet and exercise encouraged Continue all meds Follow up  In 6 month   [place], FNP

## 2016-01-18 LAB — CBC WITH DIFFERENTIAL/PLATELET
BASOS: 0 %
Basophils Absolute: 0 10*3/uL (ref 0.0–0.2)
EOS (ABSOLUTE): 0.2 10*3/uL (ref 0.0–0.4)
Eos: 3 %
Hematocrit: 42.4 % (ref 34.0–46.6)
Hemoglobin: 14 g/dL (ref 11.1–15.9)
IMMATURE GRANS (ABS): 0 10*3/uL (ref 0.0–0.1)
Immature Granulocytes: 0 %
LYMPHS ABS: 2 10*3/uL (ref 0.7–3.1)
Lymphs: 26 %
MCH: 30.6 pg (ref 26.6–33.0)
MCHC: 33 g/dL (ref 31.5–35.7)
MCV: 93 fL (ref 79–97)
Monocytes Absolute: 0.4 10*3/uL (ref 0.1–0.9)
Monocytes: 5 %
NEUTROS ABS: 4.9 10*3/uL (ref 1.4–7.0)
Neutrophils: 66 %
Platelets: 262 10*3/uL (ref 150–379)
RBC: 4.58 x10E6/uL (ref 3.77–5.28)
RDW: 13.5 % (ref 12.3–15.4)
WBC: 7.5 10*3/uL (ref 3.4–10.8)

## 2016-01-18 LAB — CMP14+EGFR
ALK PHOS: 86 IU/L (ref 39–117)
ALT: 19 IU/L (ref 0–32)
AST: 19 IU/L (ref 0–40)
Albumin/Globulin Ratio: 1.3 (ref 1.1–2.5)
Albumin: 4 g/dL (ref 3.5–5.5)
BUN/Creatinine Ratio: 10 (ref 9–23)
BUN: 7 mg/dL (ref 6–24)
Bilirubin Total: 0.5 mg/dL (ref 0.0–1.2)
CO2: 24 mmol/L (ref 18–29)
CREATININE: 0.72 mg/dL (ref 0.57–1.00)
Calcium: 8.9 mg/dL (ref 8.7–10.2)
Chloride: 102 mmol/L (ref 96–106)
GFR calc Af Amer: 121 mL/min/{1.73_m2} (ref >59)
GFR calc non Af Amer: 105 mL/min/{1.73_m2} (ref >59)
GLOBULIN, TOTAL: 3.2 g/dL (ref 1.5–4.5)
GLUCOSE: 92 mg/dL (ref 65–99)
Potassium: 4.1 mmol/L (ref 3.5–5.2)
Sodium: 139 mmol/L (ref 134–144)
TOTAL PROTEIN: 7.2 g/dL (ref 6.0–8.5)

## 2016-01-18 LAB — LIPID PANEL
CHOL/HDL RATIO: 4.7 ratio — AB (ref 0.0–4.4)
CHOLESTEROL TOTAL: 179 mg/dL (ref 100–199)
HDL: 38 mg/dL — ABNORMAL LOW (ref >39)
LDL CALC: 105 mg/dL — AB (ref 0–99)
Triglycerides: 179 mg/dL — ABNORMAL HIGH (ref 0–149)
VLDL CHOLESTEROL CAL: 36 mg/dL (ref 5–40)

## 2016-01-18 LAB — THYROID PANEL WITH TSH
Free Thyroxine Index: 1.6 (ref 1.2–4.9)
T3 Uptake Ratio: 23 % — ABNORMAL LOW (ref 24–39)
T4, Total: 7 ug/dL (ref 4.5–12.0)
TSH: 7.37 u[IU]/mL — AB (ref 0.450–4.500)

## 2016-01-20 ENCOUNTER — Other Ambulatory Visit: Payer: Self-pay | Admitting: Nurse Practitioner

## 2016-01-20 DIAGNOSIS — E039 Hypothyroidism, unspecified: Secondary | ICD-10-CM

## 2016-01-20 MED ORDER — LEVOTHYROXINE SODIUM 50 MCG PO TABS: 50.0000 ug | ORAL_TABLET | Freq: Every day | ORAL | Status: DC

## 2016-04-22 ENCOUNTER — Encounter (HOSPITAL_COMMUNITY): Payer: Self-pay | Admitting: Emergency Medicine

## 2016-04-22 ENCOUNTER — Emergency Department (HOSPITAL_COMMUNITY)
Admission: EM | Admit: 2016-04-22 | Discharge: 2016-04-22 | Disposition: A | Discharge status: Home / Self Care | Payer: 59 | Attending: Emergency Medicine | Admitting: Emergency Medicine

## 2016-04-22 DIAGNOSIS — S39012A Strain of muscle, fascia and tendon of lower back, initial encounter: Secondary | ICD-10-CM | POA: Insufficient documentation

## 2016-04-22 DIAGNOSIS — S3992XA Unspecified injury of lower back, initial encounter: Secondary | ICD-10-CM | POA: Diagnosis present

## 2016-04-22 DIAGNOSIS — Y999 Unspecified external cause status: Secondary | ICD-10-CM | POA: Diagnosis not present

## 2016-04-22 DIAGNOSIS — Z87891 Personal history of nicotine dependence: Secondary | ICD-10-CM | POA: Insufficient documentation

## 2016-04-22 DIAGNOSIS — Y92410 Unspecified street and highway as the place of occurrence of the external cause: Secondary | ICD-10-CM | POA: Diagnosis not present

## 2016-04-22 DIAGNOSIS — J45909 Unspecified asthma, uncomplicated: Secondary | ICD-10-CM | POA: Insufficient documentation

## 2016-04-22 DIAGNOSIS — Y9389 Activity, other specified: Secondary | ICD-10-CM | POA: Diagnosis not present

## 2016-04-22 MED ORDER — IBUPROFEN 800 MG PO TABS
800.0000 mg | ORAL_TABLET | Freq: Once | ORAL | Status: AC
  Administered 2016-04-22: 800 mg via ORAL
  Filled 2016-04-22: qty 1

## 2016-04-22 MED ORDER — DIAZEPAM 5 MG PO TABS
5.0000 mg | ORAL_TABLET | Freq: Once | ORAL | Status: AC
  Administered 2016-04-22: 5 mg via ORAL
  Filled 2016-04-22: qty 1

## 2016-04-22 MED ORDER — DIAZEPAM 5 MG PO TABS: 5.0000 mg | ORAL_TABLET | Freq: Three times a day (TID) | ORAL | Status: DC

## 2016-04-22 MED ORDER — IBUPROFEN 800 MG PO TABS
800.0000 mg | ORAL_TABLET | Freq: Three times a day (TID) | ORAL | Status: DC
Start: 2016-04-22 — End: 2019-01-17
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
## 2016-07-22 ENCOUNTER — Encounter: Payer: Self-pay | Admitting: Physician Assistant

## 2016-07-22 ENCOUNTER — Encounter (INDEPENDENT_AMBULATORY_CARE_PROVIDER_SITE_OTHER): Payer: Self-pay

## 2016-07-22 ENCOUNTER — Ambulatory Visit (INDEPENDENT_AMBULATORY_CARE_PROVIDER_SITE_OTHER): Payer: 59 | Admitting: Physician Assistant

## 2016-07-22 VITALS — BP 97/65 | HR 88 | Temp 98.7°F | Ht 63.0 in | Wt 233.4 lb

## 2016-07-22 DIAGNOSIS — G43109 Migraine with aura, not intractable, without status migrainosus: Secondary | ICD-10-CM

## 2016-07-22 DIAGNOSIS — J0100 Acute maxillary sinusitis, unspecified: Secondary | ICD-10-CM

## 2016-07-22 MED ORDER — AMOXICILLIN 500 MG PO CAPS: 1000.0000 mg | ORAL_CAPSULE | Freq: Two times a day (BID) | ORAL | 0 refills | Status: DC

## 2016-07-22 MED ORDER — SUMATRIPTAN SUCCINATE 50 MG PO TABS: 50.0000 mg | ORAL_TABLET | ORAL | 6 refills | Status: DC | PRN

## 2016-07-22 NOTE — Patient Instructions (Signed)

## 2016-07-22 NOTE — Progress Notes (Signed)
BP 97/65 (BP Location: Right Arm, Patient Position: Sitting, Cuff Size: Large)   Pulse 88   Temp 98.7 F (37.1 C) (Oral)   Ht 5\' 3"  (1.6 m)   Wt 233 lb 6.4 oz (105.9 kg)   BMI 41.34 kg/m    Subjective:    Patient ID: Summer [name], female    DOB: 07/22/1976, 40 y.o.   MRN: [id number]  HPI: Summer [name] is a 40 y.o. female presenting on 07/22/2016 for Migraine; Cough; and Nasal Congestion  Patient has had apparently 4 days of severe sinus pressure and drainage. She states she has significant color change to the mucus that is green and yellow. She has had some blood tinging when she blows her nose. She has a slight bit of cough at nighttime when she lays down. She has had increased amount of migraines over recent months. She has always had these for her entire life. She states she has not ever taken Imitrex as a treatment for the migraine. She typically uses ibuprofen. She states she has not been on any prevention medicine in the past. I have encouraged her to keep up with have any headache she has. She denies any problems at this time with nausea vomiting or diarrhea. She denies any fever or chills.  Relevant past medical, surgical, family and social history reviewed and updated as indicated. Interim medical history since our last visit reviewed. Allergies and medications reviewed and updated. DATA REVIEWED: CHART IN EPIC  Review of Systems  Constitutional: Negative.  Negative for activity change, fatigue and fever.  HENT: Positive for congestion, facial swelling, sinus pressure and sore throat.   Eyes: Negative.   Respiratory: Positive for cough. Negative for shortness of breath.   Cardiovascular: Negative.  Negative for chest pain.  Gastrointestinal: Negative.  Negative for abdominal pain.  Endocrine: Negative.   Genitourinary: Negative.  Negative for dysuria.  Musculoskeletal: Negative.   Skin: Negative.   Neurological: Positive for headaches. Negative for weakness.     Per HPI unless specifically indicated above     Medication List       Accurate as of 07/22/16 10:54 AM. Always use your most recent med list.          ALPRAZolam 0.5 MG tablet Commonly known as:  XANAX Take 1 tablet (0.5 mg total) by mouth 2 (two) times daily as needed for anxiety.   amoxicillin 500 MG capsule Commonly known as:  AMOXIL Take 2 capsules (1,000 mg total) by mouth 2 (two) times daily.   citalopram 20 MG tablet Commonly known as:  CELEXA Take 1 tablet (20 mg total) by mouth daily.   ibuprofen 800 MG tablet Commonly known as:  ADVIL,MOTRIN Take 1 tablet (800 mg total) by mouth 3 (three) times daily.   levothyroxine 50 MCG tablet Commonly known as:  SYNTHROID, LEVOTHROID Take 1 tablet (50 mcg total) by mouth daily.   ranitidine 75 MG tablet Commonly known as:  ZANTAC Take 1 tablet (75 mg total) by mouth daily as needed for heartburn.   SUMAtriptan 50 MG tablet Commonly known as:  IMITREX Take 1 tablet (50 mg total) by mouth every 2 (two) hours as needed for migraine. May repeat in 2 hours if headache persists or recurs.          Objective:    BP 97/65 (BP Location: Right Arm, Patient Position: Sitting, Cuff Size: Large)   Pulse 88   Temp 98.7 F (37.1 C) (Oral)   Ht 5\' 3"  (  1.6 m)   Wt 233 lb 6.4 oz (105.9 kg)   BMI 41.34 kg/m   Allergies  Allergen Reactions  . Tramadol     Vomiting and nausea    Wt Readings from Last 3 Encounters:  07/22/16 233 lb 6.4 oz (105.9 kg)  04/22/16 205 lb (93 kg)  01/17/16 237 lb (107.5 kg)    Physical Exam  Constitutional: She is oriented to person, place, and time. She appears well-developed and well-nourished.  HENT:  Head: Normocephalic and atraumatic.  Right Ear: External ear normal. Tympanic membrane is bulging. No middle ear effusion.  Left Ear: External ear normal. Tympanic membrane is bulging.  No middle ear effusion.  Nose: Mucosal edema, rhinorrhea and sinus tenderness present. Right sinus  exhibits maxillary sinus tenderness and frontal sinus tenderness. Left sinus exhibits maxillary sinus tenderness and frontal sinus tenderness.  Mouth/Throat: Uvula is midline. Oropharyngeal exudate and posterior oropharyngeal erythema present.  Eyes: Conjunctivae and EOM are normal. Pupils are equal, round, and reactive to light. Right eye exhibits no discharge. Left eye exhibits no discharge.  Neck: Normal range of motion.  Cardiovascular: Normal rate, regular rhythm and normal heart sounds.   Pulmonary/Chest: Effort normal and breath sounds normal. No respiratory distress. She has no wheezes.  Abdominal: Soft.  Lymphadenopathy:    She has no cervical adenopathy.  Neurological: She is alert and oriented to person, place, and time.  Skin: Skin is warm and dry.  Psychiatric: She has a normal mood and affect.  Nursing note and vitals reviewed.       Assessment & Plan:   1. Acute maxillary sinusitis, recurrence not specified - amoxicillin (AMOXIL) 500 MG capsule; Take 2 capsules (1,000 mg total) by mouth 2 (two) times daily.  Dispense: 40 capsule; Refill: 0  2. Migraine with aura and without status migrainosus, not intractable - SUMAtriptan (IMITREX) 50 MG tablet; Take 1 tablet (50 mg total) by mouth every 2 (two) hours as needed for migraine. May repeat in 2 hours if headache persists or recurs.  Dispense: 10 tablet; Refill: 6   Continue all other maintenance medications as listed above.  Follow up plan:  Return if symptoms worsen or fail to improve.  Educational handout given for migraines.  Remus Loffler PA-C [facility] [address]  [place], [place] [address] [phone]   07/22/2016, 10:54 AM

## 2016-08-26 ENCOUNTER — Other Ambulatory Visit: Payer: Self-pay | Admitting: Nurse Practitioner

## 2016-08-26 DIAGNOSIS — F411 Generalized anxiety disorder: Secondary | ICD-10-CM

## 2016-08-26 MED FILL — LEVOTHYROXINE 50 MCG TABLET: 50 | 30 days supply | Qty: 30 | Fill #0

## 2016-08-26 MED FILL — SUMATRIPTAN SUCC 50 MG TAB: 50 | 30 days supply | Qty: 9 | Fill #0

## 2016-08-26 NOTE — Telephone Encounter (Signed)
I saw for acute illness, will need to forward to her PCP for chronic medications.

## 2016-08-28 NOTE — Telephone Encounter (Signed)
ntbs fo r refill- last seen in march

## 2016-08-28 NOTE — Telephone Encounter (Signed)
LMOVM will NTBS for refill

## 2016-09-11 ENCOUNTER — Encounter: Payer: Self-pay | Admitting: Nurse Practitioner

## 2016-09-11 ENCOUNTER — Ambulatory Visit (INDEPENDENT_AMBULATORY_CARE_PROVIDER_SITE_OTHER): Payer: 59 | Admitting: Nurse Practitioner

## 2016-09-11 VITALS — BP 105/63 | HR 79 | Temp 97.9°F | Ht 63.0 in | Wt 233.0 lb

## 2016-09-11 DIAGNOSIS — S39012A Strain of muscle, fascia and tendon of lower back, initial encounter: Secondary | ICD-10-CM

## 2016-09-11 MED ORDER — NAPROXEN 500 MG PO TABS
500.0000 mg | ORAL_TABLET | Freq: Two times a day (BID) | ORAL | 1 refills | Status: DC
Start: 2016-09-11 — End: 2016-12-16

## 2016-09-11 MED ORDER — CYCLOBENZAPRINE HCL 10 MG PO TABS: 10.0000 mg | ORAL_TABLET | Freq: Three times a day (TID) | ORAL | 1 refills | Status: DC | PRN

## 2016-09-11 NOTE — Progress Notes (Signed)
   Subjective:    Patient ID: Summer [name], female    DOB: 10/15/1976, 40 y.o.   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 Injury occurred yesterday. Rates pain 5-6/10- today. Ibuprofen helps some with pain. Pain satys in lower back and does not radiate down either leg.   Review of Systems  Constitutional: Negative.   HENT: Negative.   Respiratory: Negative.   Cardiovascular: Negative.   Musculoskeletal: Positive for back pain.  Neurological: Negative.   Psychiatric/Behavioral: Negative.   All other systems reviewed and are negative.      Objective:   Physical Exam  Constitutional: She is oriented to person, place, and time. She appears well-developed and well-nourished.  Cardiovascular: Normal rate, regular rhythm and normal heart sounds.   Pulmonary/Chest: Effort normal and breath sounds normal.  Musculoskeletal:  Decrease ROM of lumbar spine due to pain on fllexion and extension and slight rotation (-) SLR bil Motor strength and sensation distally intact  Neurological: She is alert and oriented to person, place, and time. She has normal reflexes.  Skin: Skin is warm.  Psychiatric: She has a normal mood and affect. Her behavior is normal. Judgment and thought content normal.   BP 105/63   Pulse 79   Temp 97.9 F (36.6 C) (Oral)   Ht 5\' 3"  (1.6 m)   Wt 233 lb (105.7 kg)   BMI 41.27 kg/m         Assessment & Plan:  1. Back strain, initial encounter Moist heat to back Back stretching Proper body mechanics when lifting Rto prn - naproxen (NAPROSYN) 500 MG tablet; Take 1 tablet (500 mg total) by mouth 2 (two) times daily with a meal.  Dispense: 60 tablet; Refill: 1 - cyclobenzaprine (FLEXERIL) 10 MG tablet; Take 1 tablet (10 mg total) by mouth 3 (three) times daily as needed for muscle spasms.  Dispense: 30 tablet; Refill: 1   Mary-Margaret Daphine DeutscherMartin, FNP

## 2016-09-11 NOTE — Patient Instructions (Signed)

## 2016-11-10 ENCOUNTER — Telehealth: Payer: 59 | Admitting: Family

## 2016-11-10 DIAGNOSIS — B9689 Other specified bacterial agents as the cause of diseases classified elsewhere: Secondary | ICD-10-CM

## 2016-11-10 DIAGNOSIS — J019 Acute sinusitis, unspecified: Secondary | ICD-10-CM

## 2016-11-10 MED ORDER — AMOXICILLIN-POT CLAVULANATE 875-125 MG PO TABS: 1.0000 | ORAL_TABLET | Freq: Two times a day (BID) | ORAL | 0 refills | Status: DC

## 2016-11-10 MED ORDER — FLUTICASONE PROPIONATE 50 MCG/ACT NA SUSP: 2.0000 | Freq: Every day | NASAL | 6 refills | Status: DC

## 2016-11-10 MED FILL — FLUTICASONE PROP 50 MCG SPR: 50 | 30 days supply | Qty: 16 | Fill #0

## 2016-11-10 MED FILL — AMOX-CLAV 875-125 MG TABLET: 875-125 | 7 days supply | Qty: 14 | Fill #0

## 2016-11-10 NOTE — Progress Notes (Signed)
We are sorry that you are not feeling well.  Here is how we plan to help!  Based on what you have shared with me it looks like you have sinusitis.  Sinusitis is inflammation and infection in the sinus cavities of the head.  Based on your presentation I believe you most likely have Acute Bacterial Sinusitis.  This is an infection caused by bacteria and is treated with antibiotics. I have prescribed Flonase 2 sprays in each nostril one a day.  Augmentin 875mg /125mg  one tablet twice daily with food, for 7 days. You may use an oral decongestant such as Mucinex D or if you have glaucoma or high blood pressure use plain Mucinex. Saline nasal spray help and can safely be used as often as needed for congestion.  If you develop worsening sinus pain, fever or notice severe headache and vision changes, or if symptoms are not better after completion of antibiotic, please schedule an appointment with a health care provider.    Sinus infections are not as easily transmitted as other respiratory infection, however we still recommend that you avoid close contact with loved ones, especially the very young and elderly.  Remember to wash your hands thoroughly throughout the day as this is the number one way to prevent the spread of infection!  Home Care:  Only take medications as instructed by your medical team.  Complete the entire course of an antibiotic.  Do not take these medications with alcohol.  A steam or ultrasonic humidifier can help congestion.  You can place a towel over your head and breathe in the steam from hot water coming from a faucet.  Avoid close contacts especially the very young and the elderly.  Cover your mouth when you cough or sneeze.  Always remember to wash your hands.  Get Help Right Away If:  You develop worsening fever or sinus pain.  You develop a severe head ache or visual changes.  Your symptoms persist after you have completed your treatment plan.  Make sure  you  Understand these instructions.  Will watch your condition.  Will get help right away if you are not doing well or get worse.  Your e-visit answers were reviewed by a board certified advanced clinical practitioner to complete your personal care plan.  Depending on the condition, your plan could have included both over the counter or prescription medications.  If there is a problem please reply  once you have received a response from your provider.  Your safety is important to us.  If you have drug allergies check your prescription carefully.    You can use MyChart to ask questions about today's visit, request a non-urgent call back, or ask for a work or school excuse for 24 hours related to this e-Visit. If it has been greater than 24 hours you will need to follow up with your provider, or enter a new e-Visit to address those concerns.  You will get an e-mail in the next two days asking about your experience.  I hope that your e-visit has been valuable and will speed your recovery. Thank you for using e-visits.

## 2016-11-18 MED FILL — LEVOTHYROXINE 50 MCG TABLET: 50 | 30 days supply | Qty: 30 | Fill #1

## 2016-12-16 ENCOUNTER — Encounter: Payer: Self-pay | Admitting: Family Medicine

## 2016-12-16 ENCOUNTER — Ambulatory Visit (INDEPENDENT_AMBULATORY_CARE_PROVIDER_SITE_OTHER): Payer: 59 | Admitting: Family Medicine

## 2016-12-16 VITALS — BP 111/78 | HR 90 | Temp 99.4°F | Ht 63.0 in | Wt 233.4 lb

## 2016-12-16 DIAGNOSIS — M6281 Muscle weakness (generalized): Secondary | ICD-10-CM | POA: Diagnosis not present

## 2016-12-16 DIAGNOSIS — R509 Fever, unspecified: Secondary | ICD-10-CM | POA: Diagnosis not present

## 2016-12-16 DIAGNOSIS — R6889 Other general symptoms and signs: Secondary | ICD-10-CM

## 2016-12-16 DIAGNOSIS — J029 Acute pharyngitis, unspecified: Secondary | ICD-10-CM | POA: Diagnosis not present

## 2016-12-16 LAB — VERITOR FLU A/B WAIVED
INFLUENZA B: NEGATIVE
Influenza A: NEGATIVE

## 2016-12-16 MED ORDER — OSELTAMIVIR PHOSPHATE 75 MG PO CAPS: 75.0000 mg | ORAL_CAPSULE | Freq: Two times a day (BID) | ORAL | 0 refills | Status: DC

## 2016-12-16 NOTE — Progress Notes (Signed)
BP 111/78   Pulse 90   Temp 99.4 F (37.4 C) (Oral)   Ht 5\' 3"  (1.6 m)   Wt 233 lb 6.4 oz (105.9 kg)   BMI 41.34 kg/m    Subjective:    Patient ID: Summer [name], female    DOB: 08/02/1976, 41 y.o.   MRN: [id number]  Summer: Summer [name] is a 41 y.o. female presenting on 12/16/2016 for Fever; Generalized Body Aches; and Sore Throat   Summer [name] is a 41 year old female presenting with fevers, chills, sweats and generalized body aches over the past 2 days.  She also complains of sore throat, runny nose, and coughing productive of green colored phlegm.  Her fever has been around 100 degrees F at home.  She has experienced a little bit of diarrhea and nausea, but not much at all.  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  She has tried some motrin, over the counter could & flu medications, and 2 days of Augmentin that was left over from a previous illness.  None of those medications provided much relief.    Relevant past medical, surgical, family and social history reviewed and updated as indicated. Interim medical history since our last visit reviewed. Allergies and medications reviewed and updated.  Review of Systems  Constitutional: Positive for chills, diaphoresis and fever.  HENT: Positive for congestion, postnasal drip, rhinorrhea and sore throat. Negative for ear discharge, ear pain, sinus pain and sinus pressure.   Respiratory: Positive for cough. Negative for chest tightness and shortness of breath.   Cardiovascular: Negative for chest pain.  Gastrointestinal: Positive for diarrhea and nausea. Negative for abdominal pain, constipation and vomiting.  Genitourinary: Negative for difficulty urinating, dysuria and frequency.  Musculoskeletal: Positive for myalgias.   Per Summer unless specifically indicated above     Objective:    BP 111/78   Pulse 90   Temp 99.4 F (37.4 C) (Oral)    Ht 5\' 3"  (1.6 m)   Wt 233 lb 6.4 oz (105.9 kg)   BMI 41.34 kg/m   Wt Readings from Last 3 Encounters:  12/16/16 233 lb 6.4 oz (105.9 kg)  09/11/16 233 lb (105.7 kg)  07/22/16 233 lb 6.4 oz (105.9 kg)    Physical Exam  Constitutional: Vital signs are normal. She appears well-developed and well-nourished. She is cooperative. No distress.  HENT:  Right Ear: Tympanic membrane, external ear and ear canal normal. No drainage. No middle ear effusion.  Left Ear: Tympanic membrane, external ear and ear canal normal. No drainage.  No middle ear effusion.  Nose: Right sinus exhibits maxillary sinus tenderness. Right sinus exhibits no frontal sinus tenderness. Left sinus exhibits maxillary sinus tenderness. Left sinus exhibits no frontal sinus tenderness.  Mouth/Throat: No oropharyngeal exudate or posterior oropharyngeal erythema.  Cardiovascular: Normal rate, regular rhythm and normal heart sounds.   Pulmonary/Chest: Effort normal and breath sounds normal. She has no wheezes. She has no rhonchi. She has no rales.  Lymphadenopathy:       Head (right side): No submental, no submandibular, no tonsillar, no preauricular, no posterior auricular and no occipital adenopathy present.       Head (left side): No submental, no submandibular, no tonsillar, no preauricular, no posterior auricular and no occipital adenopathy present.    She has no cervical adenopathy.      Assessment & Plan:  Problem List Items Addressed This Visit    None    Visit Diagnoses    Flu-like symptoms    -  Primary   Relevant Medications   oseltamivir (TAMIFLU) 75 MG capsule   Other Relevant Orders   Veritor Flu A/B Waived (Completed)      Follow up plan: Return if symptoms worsen or fail to improve.  Counseling provided for all of the vaccine components Orders Placed This Encounter  Procedures  . Veritor Flu A/B Waived   Summer NoaMatthew Wallman, PA-S [employer] [facility] 12/16/2016, 3:44  PM

## 2017-01-30 ENCOUNTER — Emergency Department (HOSPITAL_COMMUNITY)
Admission: EM | Admit: 2017-01-30 | Discharge: 2017-01-30 | Disposition: A | Discharge status: Home / Self Care | Payer: 59 | Attending: Emergency Medicine | Admitting: Emergency Medicine

## 2017-01-30 ENCOUNTER — Encounter (HOSPITAL_COMMUNITY): Payer: Self-pay | Admitting: *Deleted

## 2017-01-30 ENCOUNTER — Emergency Department (HOSPITAL_COMMUNITY): Payer: 59

## 2017-01-30 DIAGNOSIS — Z79899 Other long term (current) drug therapy: Secondary | ICD-10-CM | POA: Diagnosis not present

## 2017-01-30 DIAGNOSIS — J029 Acute pharyngitis, unspecified: Secondary | ICD-10-CM | POA: Diagnosis not present

## 2017-01-30 DIAGNOSIS — R05 Cough: Secondary | ICD-10-CM | POA: Diagnosis not present

## 2017-01-30 DIAGNOSIS — J45909 Unspecified asthma, uncomplicated: Secondary | ICD-10-CM | POA: Insufficient documentation

## 2017-01-30 DIAGNOSIS — B9789 Other viral agents as the cause of diseases classified elsewhere: Secondary | ICD-10-CM

## 2017-01-30 DIAGNOSIS — Z87891 Personal history of nicotine dependence: Secondary | ICD-10-CM | POA: Diagnosis not present

## 2017-01-30 DIAGNOSIS — J988 Other specified respiratory disorders: Secondary | ICD-10-CM | POA: Diagnosis not present

## 2017-01-30 DIAGNOSIS — B349 Viral infection, unspecified: Secondary | ICD-10-CM | POA: Diagnosis not present

## 2017-01-30 LAB — CBC WITH DIFFERENTIAL/PLATELET
Basophils Absolute: 0 10*3/uL (ref 0.0–0.1)
Basophils Relative: 0 %
EOS PCT: 0 %
Eosinophils Absolute: 0 10*3/uL (ref 0.0–0.7)
HCT: 39.7 % (ref 36.0–46.0)
Hemoglobin: 14 g/dL (ref 12.0–15.0)
LYMPHS PCT: 12 %
Lymphs Abs: 1.1 10*3/uL (ref 0.7–4.0)
MCH: 32 pg (ref 26.0–34.0)
MCHC: 35.3 g/dL (ref 30.0–36.0)
MCV: 90.6 fL (ref 78.0–100.0)
Monocytes Absolute: 0.5 10*3/uL (ref 0.1–1.0)
Monocytes Relative: 5 %
Neutro Abs: 7.5 10*3/uL (ref 1.7–7.7)
Neutrophils Relative %: 83 %
PLATELETS: 229 10*3/uL (ref 150–400)
RBC: 4.38 MIL/uL (ref 3.87–5.11)
RDW: 12.6 % (ref 11.5–15.5)
WBC: 9.2 10*3/uL (ref 4.0–10.5)

## 2017-01-30 LAB — COMPREHENSIVE METABOLIC PANEL
ALT: 20 U/L (ref 14–54)
AST: 28 U/L (ref 15–41)
Albumin: 3.5 g/dL (ref 3.5–5.0)
Alkaline Phosphatase: 79 U/L (ref 38–126)
Anion gap: 8 (ref 5–15)
BUN: 9 mg/dL (ref 6–20)
CHLORIDE: 103 mmol/L (ref 101–111)
CO2: 22 mmol/L (ref 22–32)
CREATININE: 0.88 mg/dL (ref 0.44–1.00)
Calcium: 8.4 mg/dL — ABNORMAL LOW (ref 8.9–10.3)
GFR calc Af Amer: >60 mL/min (ref >60)
Glucose, Bld: 117 mg/dL — ABNORMAL HIGH (ref 65–99)
Potassium: 3.2 mmol/L — ABNORMAL LOW (ref 3.5–5.1)
Sodium: 133 mmol/L — ABNORMAL LOW (ref 135–145)
Total Bilirubin: 0.9 mg/dL (ref 0.3–1.2)
Total Protein: 7.6 g/dL (ref 6.5–8.1)

## 2017-01-30 LAB — POC URINE PREG, ED: Preg Test, Ur: NEGATIVE

## 2017-01-30 LAB — LIPASE, BLOOD: LIPASE: 19 U/L (ref 11–51)

## 2017-01-30 LAB — RAPID STREP SCREEN (MED CTR MEBANE ONLY): STREPTOCOCCUS, GROUP A SCREEN (DIRECT): NEGATIVE

## 2017-01-30 MED ORDER — ACETAMINOPHEN 325 MG PO TABS
650.0000 mg | ORAL_TABLET | Freq: Once | ORAL | Status: AC
  Administered 2017-01-30: 650 mg via ORAL
  Filled 2017-01-30: qty 2

## 2017-01-30 MED ORDER — POTASSIUM CHLORIDE CRYS ER 20 MEQ PO TBCR
40.0000 meq | EXTENDED_RELEASE_TABLET | Freq: Once | ORAL | Status: AC
  Administered 2017-01-30: 40 meq via ORAL
  Filled 2017-01-30: qty 2

## 2017-01-30 MED ORDER — SODIUM CHLORIDE 0.9 % IV BOLUS (SEPSIS)
1000.0000 mL | Freq: Once | INTRAVENOUS | Status: AC
  Administered 2017-01-30: 1000 mL via INTRAVENOUS

## 2017-01-30 MED ORDER — ONDANSETRON HCL 4 MG/2ML IJ SOLN
4.0000 mg | Freq: Once | INTRAMUSCULAR | Status: AC
  Administered 2017-01-30: 4 mg via INTRAVENOUS
  Filled 2017-01-30: qty 2

## 2017-01-30 MED ORDER — ONDANSETRON HCL 4 MG PO TABS: 4.0000 mg | ORAL_TABLET | Freq: Three times a day (TID) | ORAL | 0 refills | Status: DC | PRN

## 2017-01-30 NOTE — ED Notes (Signed)
Pt also c/o cough. 

## 2017-01-30 NOTE — ED Triage Notes (Signed)
Pt c/o n/v, fever, sore throat that started yesterday,

## 2017-01-30 NOTE — ED Notes (Signed)
Pt was informed of need for urine sample but states she is unable to provide one at this time. Pt states she will inform us when she is able.

## 2017-01-30 NOTE — ED Notes (Signed)
Pt complaining of a sore throat that started yesterday. Pt says started having nausea this morning.

## 2017-01-30 NOTE — ED Provider Notes (Signed)
[facility] Provider Note   CSN: [id number] Arrival date & time: 01/30/17  0400     History   Chief Complaint Chief Complaint  Patient presents with  . Emesis    HPI Summer [name] is a 41 y.o. female.  HPI  41 year old female presents with fever, sore throat, and vomiting. She states that yesterday afternoon she started having a sore throat. Later developed a fever of 101 with chills. Has been having dry heaving and has not been eating or drinking. She has abdominal cramping that seems to come prior to the vomiting and then gets better. No diarrhea. No chest pain or shortness of breath. Mild cough. Mild headache. She has a history of thyroid problems but otherwise denies significant medical history.  Past Medical History:  Diagnosis Date  . Anxiety   . Asthma   . GERD (gastroesophageal reflux disease)     Patient Active Problem List   Diagnosis Date Noted  . GAD (generalized anxiety disorder) 01/17/2016  . Gastroesophageal reflux disease without esophagitis 01/17/2016  . Morbid obesity (HCC) 01/17/2016    Past Surgical History:  Procedure Laterality Date  . CHOLECYSTECTOMY N/A 02/21/2014   Procedure: LAPAROSCOPIC CHOLECYSTECTOMY;  Surgeon: Dalia Heading, MD;  Location: [facility];  Service: General;  Laterality: N/A;  . GALLBLADDER SURGERY    . TUBAL LIGATION    . TYMPANOSTOMY TUBE PLACEMENT Bilateral     OB History    No data available       Home Medications    Prior to Admission medications   Medication Sig Start Date End Date Taking? Authorizing Provider  ALPRAZolam Prudy Feeler) 0.5 MG tablet Take 1 tablet (0.5 mg total) by mouth 2 (two) times daily as needed for anxiety. 01/17/16  Yes Mary-Margaret Daphine Deutscher, FNP  citalopram (CELEXA) 20 MG tablet Take 1 tablet (20 mg total) by mouth daily. 01/17/16  Yes Mary-Margaret Daphine Deutscher, FNP  fluticasone (FLONASE) 50 MCG/ACT nasal spray Place 2 sprays into both nostrils daily. 11/10/16  Yes Eulis Foster, FNP  ibuprofen  (ADVIL,MOTRIN) 800 MG tablet Take 1 tablet (800 mg total) by mouth 3 (three) times daily. 04/22/16  Yes Tammy Triplett, PA-C  levothyroxine (SYNTHROID, LEVOTHROID) 50 MCG tablet Take 1 tablet (50 mcg total) by mouth daily. 01/20/16  Yes Mary-Margaret Daphine Deutscher, FNP  ranitidine (ZANTAC) 75 MG tablet Take 1 tablet (75 mg total) by mouth daily as needed for heartburn. 01/17/16  Yes Mary-Margaret Daphine Deutscher, FNP  cyclobenzaprine (FLEXERIL) 10 MG tablet Take 1 tablet (10 mg total) by mouth 3 (three) times daily as needed for muscle spasms. 09/11/16   Mary-Margaret Daphine Deutscher, FNP  ondansetron (ZOFRAN) 4 MG tablet Take 1 tablet (4 mg total) by mouth every 8 (eight) hours as needed for nausea or vomiting. 01/30/17   Pricilla Loveless, MD  oseltamivir (TAMIFLU) 75 MG capsule Take 1 capsule (75 mg total) by mouth 2 (two) times daily. 12/16/16   Elige Radon Dettinger, MD  SUMAtriptan (IMITREX) 50 MG tablet Take 1 tablet (50 mg total) by mouth every 2 (two) hours as needed for migraine. May repeat in 2 hours if headache persists or recurs. 07/22/16   Remus Loffler, PA-C    Family History Family History  Problem Relation Age of Onset  . Cancer Mother     colon  . Hypertension Mother   . Hypothyroidism Mother   . Heart disease Mother   . Heart disease Father     Social History Social History  Substance Use Topics  . Smoking  status: Former Smoker    Packs/day: 0.50    Years: 3.00    Types: Cigarettes    Quit date: 02/20/1998  . Smokeless tobacco: Never Used  . Alcohol use No     Allergies   Tramadol   Review of Systems Review of Systems  Constitutional: Positive for chills and fever.  HENT: Positive for sore throat.   Respiratory: Positive for cough. Negative for shortness of breath.   Cardiovascular: Negative for chest pain.  Gastrointestinal: Positive for abdominal pain, nausea and vomiting. Negative for diarrhea.  Genitourinary: Negative for dysuria.  Neurological: Positive for headaches.  All other systems  reviewed and are negative.    Physical Exam Updated Vital Signs BP (!) 93/47 (BP Location: Left Arm)   Pulse 93   Temp 99.4 F (37.4 C) (Oral)   Resp 20   Ht 5\' 3"  (1.6 m)   Wt 220 lb (99.8 kg)   LMP 12/27/2016   SpO2 95%   BMI 38.97 kg/m   Physical Exam  Constitutional: She is oriented to person, place, and time. She appears well-developed and well-nourished.  HENT:  Head: Normocephalic and atraumatic.  Right Ear: External ear normal.  Left Ear: External ear normal.  Nose: Nose normal.  Mouth/Throat: Uvula is midline and oropharynx is clear and moist. No trismus in the jaw. No uvula swelling. No oropharyngeal exudate, posterior oropharyngeal edema, posterior oropharyngeal erythema or tonsillar abscesses. Tonsils are 1+ on the right. Tonsils are 1+ on the left. No tonsillar exudate.  Eyes: Right eye exhibits no discharge. Left eye exhibits no discharge.  Cardiovascular: Regular rhythm and normal heart sounds.  Tachycardia present.   Pulmonary/Chest: Effort normal and breath sounds normal. She has no wheezes. She has no rales.  Abdominal: Soft. She exhibits no distension. There is no tenderness.  Neurological: She is alert and oriented to person, place, and time.  Skin: Skin is warm and dry.  Nursing note and vitals reviewed.    ED Treatments / Results  Labs (all labs ordered are listed, but only abnormal results are displayed) Labs Reviewed  COMPREHENSIVE METABOLIC PANEL - Abnormal; Notable for the following:       Result Value   Sodium 133 (*)    Potassium 3.2 (*)    Glucose, Bld 117 (*)    Calcium 8.4 (*)    All other components within normal limits  RAPID STREP SCREEN (NOT AT [facility])  CULTURE, GROUP A STREP ([facility])  LIPASE, BLOOD  CBC WITH DIFFERENTIAL/PLATELET  POC URINE PREG, ED    EKG  EKG Interpretation None       Radiology Dg Chest 2 View  Result Date: 01/30/2017 CLINICAL DATA:  Initial evaluation for acute cough, fever. EXAM: CHEST  2 VIEW  COMPARISON:  Prior radiograph from 10/09/2015. FINDINGS: The cardiac and mediastinal silhouettes are stable in size and contour, and remain within normal limits. The lungs are normally inflated. No airspace consolidation, pleural effusion, or pulmonary edema is identified. There is no pneumothorax. No acute osseous abnormality identified. IMPRESSION: No active cardiopulmonary disease. Electronically Signed   By: Rise Mu M.D.   On: 01/30/2017 06:15    Procedures Procedures (including critical care time)  Medications Ordered in ED Medications  ondansetron (ZOFRAN) injection 4 mg (4 mg Intravenous Given 01/30/17 0455)  sodium chloride 0.9 % bolus 1,000 mL (0 mLs Intravenous Stopped 01/30/17 0612)  acetaminophen (TYLENOL) tablet 650 mg (650 mg Oral Given 01/30/17 0453)  potassium chloride SA (K-DUR,KLOR-CON) CR tablet 40 mEq (40  mEq Oral Given 01/30/17 0651)     Initial Impression / Assessment and Plan / ED Course  I have reviewed the triage vital signs and the nursing notes.  Pertinent labs & imaging results that were available during my care of the patient were reviewed by me and considered in my medical decision making (see chart for details).     Patient appears to have a viral illness. She is feeling better. No current vomiting. Benign exam. Strep screen from triage negative, low suspicion for strep. No signs of pneumonia. No meningismus. Stable for symptomatic treatment and discharge with PCP f/u.  Final Clinical Impressions(s) / ED Diagnoses   Final diagnoses:  Viral respiratory illness    New Prescriptions Discharge Medication List as of 01/30/2017  6:45 AM    START taking these medications   Details  ondansetron (ZOFRAN) 4 MG tablet Take 1 tablet (4 mg total) by mouth every 8 (eight) hours as needed for nausea or vomiting., Starting Sat 01/30/2017, Print         Pricilla LovelessScott Zuriyah Shatz, MD 01/30/17 1554

## 2017-02-01 LAB — CULTURE, GROUP A STREP (THRC)

## 2017-02-02 ENCOUNTER — Ambulatory Visit (INDEPENDENT_AMBULATORY_CARE_PROVIDER_SITE_OTHER): Payer: 59 | Admitting: Physician Assistant

## 2017-02-02 ENCOUNTER — Encounter: Payer: Self-pay | Admitting: Physician Assistant

## 2017-02-02 VITALS — BP 110/76 | HR 100 | Temp 99.3°F | Ht 63.0 in | Wt 232.0 lb

## 2017-02-02 DIAGNOSIS — J029 Acute pharyngitis, unspecified: Secondary | ICD-10-CM | POA: Diagnosis not present

## 2017-02-02 LAB — RAPID STREP SCREEN (MED CTR MEBANE ONLY): Strep Gp A Ag, IA W/Reflex: NEGATIVE

## 2017-02-02 LAB — CULTURE, GROUP A STREP

## 2017-02-02 MED ORDER — AMOXICILLIN 500 MG PO CAPS: 1000.0000 mg | ORAL_CAPSULE | Freq: Two times a day (BID) | ORAL | 0 refills | Status: DC

## 2017-02-02 MED ORDER — PREDNISONE 10 MG (21) PO TBPK: ORAL_TABLET | ORAL | 0 refills | Status: DC

## 2017-02-02 NOTE — Progress Notes (Signed)
BP 110/76   Pulse 100   Temp 99.3 F (37.4 C) (Oral)   Ht 5\' 3"  (1.6 m)   Wt 232 lb (105.2 kg)   BMI 41.10 kg/m    Subjective:    Patient ID: Summer [name], female    DOB: 06-13-1976, 41 y.o.   MRN: [id number]  HPI: Summer [name] is a 41 y.o. female presenting on 02/02/2017 for Sore Throat  This patient has had less than several days fatigue, sore throat, aching. Was seen in ED 3 days ago for dehydration and fluids. Strep test was negative there. Complains of headache and postnasal drainage. There is copious drainage at times. Associated sore throat, decreased appetite and headache.  Denies rash or exposure to mono.  Relevant past medical, surgical, family and social history reviewed and updated as indicated. Allergies and medications reviewed and updated.  Past Medical History:  Diagnosis Date  . Anxiety   . Asthma   . GERD (gastroesophageal reflux disease)     Past Surgical History:  Procedure Laterality Date  . CHOLECYSTECTOMY N/A 02/21/2014   Procedure: LAPAROSCOPIC CHOLECYSTECTOMY;  Surgeon: Dalia Heading, MD;  Location: [facility];  Service: General;  Laterality: N/A;  . GALLBLADDER SURGERY    . TUBAL LIGATION    . TYMPANOSTOMY TUBE PLACEMENT Bilateral     Review of Systems  Constitutional: Positive for chills, fatigue and fever. Negative for activity change and appetite change.  HENT: Positive for postnasal drip and sore throat. Negative for congestion and sneezing.   Eyes: Negative.   Respiratory: Negative for cough, shortness of breath and wheezing.   Cardiovascular: Negative.  Negative for chest pain, palpitations and leg swelling.  Gastrointestinal: Negative.   Genitourinary: Negative.   Musculoskeletal: Negative.   Skin: Negative.   Neurological: Positive for headaches.    Allergies as of 02/02/2017      Reactions   Tramadol    Vomiting and nausea      Medication List       Accurate as of 02/02/17 12:34 PM. Always use your most recent med list.          ALPRAZolam 0.5 MG tablet Commonly known as:  XANAX Take 1 tablet (0.5 mg total) by mouth 2 (two) times daily as needed for anxiety.   amoxicillin 500 MG capsule Commonly known as:  AMOXIL Take 2 capsules (1,000 mg total) by mouth 2 (two) times daily.   citalopram 20 MG tablet Commonly known as:  CELEXA Take 1 tablet (20 mg total) by mouth daily.   cyclobenzaprine 10 MG tablet Commonly known as:  FLEXERIL Take 1 tablet (10 mg total) by mouth 3 (three) times daily as needed for muscle spasms.   fluticasone 50 MCG/ACT nasal spray Commonly known as:  FLONASE Place 2 sprays into both nostrils daily.   ibuprofen 800 MG tablet Commonly known as:  ADVIL,MOTRIN Take 1 tablet (800 mg total) by mouth 3 (three) times daily.   levothyroxine 50 MCG tablet Commonly known as:  SYNTHROID, LEVOTHROID Take 1 tablet (50 mcg total) by mouth daily.   ondansetron 4 MG tablet Commonly known as:  ZOFRAN Take 1 tablet (4 mg total) by mouth every 8 (eight) hours as needed for nausea or vomiting.   predniSONE 10 MG (21) Tbpk tablet Commonly known as:  STERAPRED UNI-PAK 21 TAB As directed x 6 days   ranitidine 75 MG tablet Commonly known as:  ZANTAC Take 1 tablet (75 mg total) by mouth daily as needed for heartburn.  SUMAtriptan 50 MG tablet Commonly known as:  IMITREX Take 1 tablet (50 mg total) by mouth every 2 (two) hours as needed for migraine. May repeat in 2 hours if headache persists or recurs.          Objective:    BP 110/76   Pulse 100   Temp 99.3 F (37.4 C) (Oral)   Ht 5\' 3"  (1.6 m)   Wt 232 lb (105.2 kg)   BMI 41.10 kg/m   Allergies  Allergen Reactions  . Tramadol     Vomiting and nausea    Physical Exam  Constitutional: She is oriented to person, place, and time. She appears well-developed and well-nourished.  HENT:  Head: Normocephalic and atraumatic.  Right Ear: Tympanic membrane is not erythematous. A middle ear effusion is present.  Left Ear:  Tympanic membrane is not erythematous. A middle ear effusion is present.  Nose: Mucosal edema present. Right sinus exhibits no frontal sinus tenderness. Left sinus exhibits no frontal sinus tenderness.  Mouth/Throat: Oropharyngeal exudate, posterior oropharyngeal edema and posterior oropharyngeal erythema present. No tonsillar abscesses.    Eyes: Conjunctivae and EOM are normal. Pupils are equal, round, and reactive to light.  Neck: Normal range of motion.  Cardiovascular: Normal rate, regular rhythm, normal heart sounds and intact distal pulses.   Pulmonary/Chest: Effort normal and breath sounds normal.  Abdominal: Soft. Bowel sounds are normal.  Neurological: She is alert and oriented to person, place, and time. She has normal reflexes.  Skin: Skin is warm and dry. No rash noted.  Psychiatric: She has a normal mood and affect. Her behavior is normal. Judgment and thought content normal.  Nursing note and vitals reviewed.   Results for orders placed or performed during the hospital encounter of 01/30/17  Rapid strep screen  Result Value Ref Range   Streptococcus, Group A Screen (Direct) NEGATIVE NEGATIVE  Culture, group A strep  Result Value Ref Range   Specimen Description THROAT    Special Requests NONE Reflexed from [id number]    Culture      NO GROUP A STREP (S.PYOGENES) ISOLATED Performed at [facility], [address] [place], [place] [address]    Report Status 02/01/2017 FINAL   Comprehensive metabolic panel  Result Value Ref Range   Sodium 133 (L) 135 - 145 mmol/L   Potassium 3.2 (L) 3.5 - 5.1 mmol/L   Chloride 103 101 - 111 mmol/L   CO2 22 22 - 32 mmol/L   Glucose, Bld 117 (H) 65 - 99 mg/dL   BUN 9 6 - 20 mg/dL   Creatinine, Ser 7.820.88 0.44 - 1.00 mg/dL   Calcium 8.4 (L) 8.9 - 10.3 mg/dL   Total Protein 7.6 6.5 - 8.1 g/dL   Albumin 3.5 3.5 - 5.0 g/dL   AST 28 15 - 41 U/L   ALT 20 14 - 54 U/L   Alkaline Phosphatase 79 38 - 126 U/L   Total Bilirubin 0.9 0.3 - 1.2  mg/dL   GFR calc non Af Amer >60 >60 mL/min   GFR calc Af Amer >60 >60 mL/min   Anion gap 8 5 - 15  Lipase, blood  Result Value Ref Range   Lipase 19 11 - 51 U/L  CBC with Differential  Result Value Ref Range   WBC 9.2 4.0 - 10.5 K/uL   RBC 4.38 3.87 - 5.11 MIL/uL   Hemoglobin 14.0 12.0 - 15.0 g/dL   HCT 95.639.7 21.336.0 - 08.646.0 %   MCV 90.6  78.0 - 100.0 fL   MCH 32.0 26.0 - 34.0 pg   MCHC 35.3 30.0 - 36.0 g/dL   RDW 40.9 81.1 - 91.4 %   Platelets 229 150 - 400 K/uL   Neutrophils Relative % 83 %   Neutro Abs 7.5 1.7 - 7.7 K/uL   Lymphocytes Relative 12 %   Lymphs Abs 1.1 0.7 - 4.0 K/uL   Monocytes Relative 5 %   Monocytes Absolute 0.5 0.1 - 1.0 K/uL   Eosinophils Relative 0 %   Eosinophils Absolute 0.0 0.0 - 0.7 K/uL   Basophils Relative 0 %   Basophils Absolute 0.0 0.0 - 0.1 K/uL  POC urine preg, ED  Result Value Ref Range   Preg Test, Ur NEGATIVE NEGATIVE      Assessment & Plan:   1. Sore throat - Rapid strep screen (not at [facility]) - predniSONE (STERAPRED UNI-PAK 21 TAB) 10 MG (21) TBPK tablet; As directed x 6 days  Dispense: 21 tablet; Refill: 0  2. Pharyngitis, unspecified etiology - amoxicillin (AMOXIL) 500 MG capsule; Take 2 capsules (1,000 mg total) by mouth 2 (two) times daily.  Dispense: 40 capsule; Refill: 0 - predniSONE (STERAPRED UNI-PAK 21 TAB) 10 MG (21) TBPK tablet; As directed x 6 days  Dispense: 21 tablet; Refill: 0   Continue all other maintenance medications as listed above.  Follow up plan: Return if symptoms worsen or fail to improve.  Educational handout given for pharyngitis  Remus Loffler PA-C [facility] [address]  [place], [place] [address] [phone]   02/02/2017, 12:34 PM

## 2017-02-02 NOTE — Patient Instructions (Signed)

## 2017-05-10 MED FILL — FLUTICASONE PROP 50 MCG SPR: 50 | 30 days supply | Qty: 16 | Fill #1

## 2017-05-11 ENCOUNTER — Telehealth: Payer: 59 | Admitting: Family

## 2017-05-11 DIAGNOSIS — J302 Other seasonal allergic rhinitis: Secondary | ICD-10-CM | POA: Diagnosis not present

## 2017-05-11 DIAGNOSIS — J301 Allergic rhinitis due to pollen: Secondary | ICD-10-CM

## 2017-05-11 NOTE — Progress Notes (Signed)
E visit for Allergic Rhinitis We are sorry that you are not feeling well.  Her is how we plan to help!  Based on what you have shared with me it looks like you have Allergic Rhinitis.  Rhinitis is when a reaction occurs that causes nasal congestion, runny nose, sneezing, and itching.  Most types of rhinitis are caused by an inflammation and are associated with symptoms in the eyes ears or throat. There are several types of rhinitis.  The most common are acute rhinitis, which is usually caused by a viral illness, allergic or seasonal rhinitis, and nonallergic or year-round rhinitis.  Nasal allergies occur certain times of the year.  Allergic rhinitis is caused when allergens in the air trigger the release of histamine in the body.  Histamine causes itching, swelling, and fluid to build up in the fragile linings of the nasal passages, sinuses and eyelids.  An itchy nose and clear discharge are common.  I recommend the following over the counter treatments: Xyzal 5 mg take 1 tablet daily  I also would recommend a nasal spray: Flonase 2 sprays into each nostril once daily  HOME CARE:   You can use an over-the-counter saline nasal spray as needed  Avoid areas where there is heavy dust, mites, or molds  Stay indoors on windy days during the pollen season  Keep windows closed in home, at least in bedroom; use air conditioner.  Use high-efficiency house air filter  Keep windows closed in car, turn AC on re-circulate  Avoid playing out with dog during pollen season  GET HELP RIGHT AWAY IF:   If your symptoms do not improve within 10 days  You become short of breath  You develop yellow or green discharge from your nose for over 3 days  You have coughing fits  MAKE SURE YOU:   Understand these instructions  Will watch your condition  Will get help right away if you are not doing well or get worse  Thank you for choosing an e-visit. Your e-visit answers were reviewed by a board  certified advanced clinical practitioner to complete your personal care plan. Depending upon the condition, your plan could have included both over the counter or prescription medications. Please review your pharmacy choice. Be sure that the pharmacy you have chosen is open so that you can pick up your prescription now.  If there is a problem you may message your provider in MyChart to have the prescription routed to another pharmacy. Your safety is important to us. If you have drug allergies check your prescription carefully.  For the next 24 hours, you can use MyChart to ask questions about today's visit, request a non-urgent call back, or ask for a work or school excuse from your e-visit provider. You will get an email in the next two days asking about your experience. I hope that your e-visit has been valuable and will speed your recovery.         

## 2017-05-14 ENCOUNTER — Telehealth: Payer: 59 | Admitting: Nurse Practitioner

## 2017-05-14 DIAGNOSIS — J0100 Acute maxillary sinusitis, unspecified: Secondary | ICD-10-CM | POA: Diagnosis not present

## 2017-05-14 DIAGNOSIS — R05 Cough: Secondary | ICD-10-CM

## 2017-05-14 DIAGNOSIS — R059 Cough, unspecified: Secondary | ICD-10-CM

## 2017-05-14 MED ORDER — BENZONATATE 100 MG PO CAPS: 100.0000 mg | ORAL_CAPSULE | Freq: Three times a day (TID) | ORAL | 0 refills | Status: DC | PRN

## 2017-05-14 MED ORDER — DOXYCYCLINE HYCLATE 100 MG PO TABS: 100.0000 mg | ORAL_TABLET | Freq: Two times a day (BID) | ORAL | 0 refills | Status: DC

## 2017-05-14 NOTE — Progress Notes (Signed)
So sorry this response is a few minutes late. The provider on call , his car broke down and he was not able to get to you within an hour. This is not a usualt occurrence and we hope that you will understand. See below for your care. We are sorry that you are not feeling well.  Here is how we plan to help!  Based on your presentation I believe you most likely have A cough due to bacteria.  When patients have a fever and a productive cough with a change in color or increased sputum production, we are concerned about bacterial bronchitis.  If left untreated it can progress to pneumonia.  If your symptoms do not improve with your treatment plan it is important that you contact your provider.   I hve prescribed Doxycycline 100 mg twice a day for 7 days     In addition you may use A prescription cough medication called Tessalon Perles 100mg . You may take 1-2 capsules every 8 hours as needed for your cough.    From your responses in the eVisit questionnaire you describe inflammation in the upper respiratory tract which is causing a significant cough.  This is commonly called Bronchitis and has four common causes:    Allergies  Viral Infections  Acid Reflux  Bacterial Infection Allergies, viruses and acid reflux are treated by controlling symptoms or eliminating the cause. An example might be a cough caused by taking certain blood pressure medications. You stop the cough by changing the medication. Another example might be a cough caused by acid reflux. Controlling the reflux helps control the cough.  USE OF BRONCHODILATOR ("RESCUE") INHALERS: There is a risk from using your bronchodilator too frequently.  The risk is that over-reliance on a medication which only relaxes the muscles surrounding the breathing tubes can reduce the effectiveness of medications prescribed to reduce swelling and congestion of the tubes themselves.  Although you feel brief relief from the bronchodilator inhaler, your asthma  may actually be worsening with the tubes becoming more swollen and filled with mucus.  This can delay other crucial treatments, such as oral steroid medications. If you need to use a bronchodilator inhaler daily, several times per day, you should discuss this with your provider.  There are probably better treatments that could be used to keep your asthma under control.     HOME CARE . Only take medications as instructed by your medical team. . Complete the entire course of an antibiotic. . Drink plenty of fluids and get plenty of rest. . Avoid close contacts especially the very young and the elderly . Cover your mouth if you cough or cough into your sleeve. . Always remember to wash your hands . A steam or ultrasonic humidifier can help congestion.   GET HELP RIGHT AWAY IF: . You develop worsening fever. . You become short of breath . You cough up blood. . Your symptoms persist after you have completed your treatment plan MAKE SURE YOU   Understand these instructions.  Will watch your condition.  Will get help right away if you are not doing well or get worse.  Your e-visit answers were reviewed by a board certified advanced clinical practitioner to complete your personal care plan.  Depending on the condition, your plan could have included both over the counter or prescription medications. If there is a problem please reply  once you have received a response from your provider. Your safety is important to us.  If you  have drug allergies check your prescription carefully.    You can use MyChart to ask questions about today's visit, request a non-urgent call back, or ask for a work or school excuse for 24 hours related to this e-Visit. If it has been greater than 24 hours you will need to follow up with your provider, or enter a new e-Visit to address those concerns. You will get an e-mail in the next two days asking about your experience.  I hope that your e-visit has been valuable and  will speed your recovery. Thank you for using e-visits.

## 2017-07-07 ENCOUNTER — Ambulatory Visit (INDEPENDENT_AMBULATORY_CARE_PROVIDER_SITE_OTHER): Payer: 59 | Admitting: Physician Assistant

## 2017-07-07 ENCOUNTER — Encounter: Payer: Self-pay | Admitting: Physician Assistant

## 2017-07-07 VITALS — BP 98/66 | HR 69 | Temp 97.3°F | Ht 63.0 in | Wt 246.0 lb

## 2017-07-07 DIAGNOSIS — F39 Unspecified mood [affective] disorder: Secondary | ICD-10-CM | POA: Diagnosis not present

## 2017-07-07 DIAGNOSIS — F411 Generalized anxiety disorder: Secondary | ICD-10-CM | POA: Diagnosis not present

## 2017-07-07 DIAGNOSIS — S39012D Strain of muscle, fascia and tendon of lower back, subsequent encounter: Secondary | ICD-10-CM | POA: Diagnosis not present

## 2017-07-07 DIAGNOSIS — E038 Other specified hypothyroidism: Secondary | ICD-10-CM | POA: Insufficient documentation

## 2017-07-07 DIAGNOSIS — R4586 Emotional lability: Secondary | ICD-10-CM

## 2017-07-07 DIAGNOSIS — S39012A Strain of muscle, fascia and tendon of lower back, initial encounter: Secondary | ICD-10-CM | POA: Insufficient documentation

## 2017-07-07 DIAGNOSIS — E039 Hypothyroidism, unspecified: Secondary | ICD-10-CM | POA: Diagnosis not present

## 2017-07-07 DIAGNOSIS — Z0000 Encounter for general adult medical examination without abnormal findings: Secondary | ICD-10-CM | POA: Diagnosis not present

## 2017-07-07 MED ORDER — CYCLOBENZAPRINE HCL 10 MG PO TABS: 10.0000 mg | ORAL_TABLET | Freq: Three times a day (TID) | ORAL | 1 refills | Status: DC | PRN

## 2017-07-07 MED ORDER — BUPROPION HCL ER (XL) 150 MG PO TB24
150.0000 mg | ORAL_TABLET | Freq: Every day | ORAL | 1 refills | Status: DC
Start: 2017-07-07 — End: 2017-08-19

## 2017-07-07 MED ORDER — ALPRAZOLAM 0.5 MG PO TABS: 0.5000 mg | ORAL_TABLET | Freq: Two times a day (BID) | ORAL | 2 refills | Status: DC | PRN

## 2017-07-07 MED ORDER — LEVOTHYROXINE SODIUM 50 MCG PO TABS: 50.0000 ug | ORAL_TABLET | Freq: Every day | ORAL | 3 refills | Status: DC

## 2017-07-07 NOTE — Patient Instructions (Signed)
In a few days you may receive a survey in the mail or online from [employer] regarding your visit with us today. Please take a moment to fill this out. Your feedback is very important to our whole office. It can help us better understand your needs as well as improve your experience and satisfaction. Thank you for taking your time to complete it. We care about you.  Charliee Krenz, PA-C  

## 2017-07-07 NOTE — Progress Notes (Signed)
BP 98/66   Pulse 69   Temp (!) 97.3 F (36.3 C) (Oral)   Ht _0  (1.6 m)   Wt 246 lb (111.6 kg)   BMI 43.58 kg/m    Subjective:    Patient ID: Summer [name], female    DOB: Feb 08, 1976, 41 y.o.   MRN: [id number]  HPI: Summer [name] is a 41 y.o. female presenting on 07/07/2017 for Medication Refill and Foot Swelling  This patient comes in for periodic recheck on medications and conditions including Recurrent issues with back strain, hypothyroidism, mood change, generalized anxiety. She had some edema when she traveled last week to [place]. It was mainly on the way back. She had been on a cruise. She did drink alcohol almost every day. This is not something she normally does. She probably did have much more salty foods. It has resolved in the past few days. She does need refills and labs performed..   All medications are reviewed today. There are no reports of any problems with the medications. All of the medical conditions are reviewed and updated.  Lab work is reviewed and will be ordered as medically necessary. There are no new problems reported with today's visit.   Relevant past medical, surgical, family and social history reviewed and updated as indicated. Allergies and medications reviewed and updated.  Past Medical History:  Diagnosis Date  . Anxiety   . Asthma   . GERD (gastroesophageal reflux disease)     Past Surgical History:  Procedure Laterality Date  . CHOLECYSTECTOMY N/A 02/21/2014   Procedure: LAPAROSCOPIC CHOLECYSTECTOMY;  Surgeon: Jamesetta So, MD;  Location: [facility];  Service: General;  Laterality: N/A;  . GALLBLADDER SURGERY    . TUBAL LIGATION    . TYMPANOSTOMY TUBE PLACEMENT Bilateral     Review of Systems  Constitutional: Negative.  Negative for activity change, fatigue and fever.  HENT: Negative.   Eyes: Negative.   Respiratory: Negative.  Negative for cough, shortness of breath and wheezing.   Cardiovascular: Positive for leg swelling.  Negative for chest pain.  Gastrointestinal: Negative.  Negative for abdominal pain.  Endocrine: Negative.   Genitourinary: Negative.  Negative for dysuria.  Musculoskeletal: Negative.   Skin: Negative.   Neurological: Negative.     Allergies as of 07/07/2017      Reactions   Tramadol    Vomiting and nausea      Medication List       Accurate as of 07/07/17  8:58 AM. Always use your most recent med list.          ALPRAZolam 0.5 MG tablet Commonly known as:  XANAX Take 1 tablet (0.5 mg total) by mouth 2 (two) times daily as needed for anxiety.   buPROPion 150 MG 24 hr tablet Commonly known as:  WELLBUTRIN XL Take 1-2 tablets (150-300 mg total) by mouth daily.   cyclobenzaprine 10 MG tablet Commonly known as:  FLEXERIL Take 1 tablet (10 mg total) by mouth 3 (three) times daily as needed for muscle spasms.   fluticasone 50 MCG/ACT nasal spray Commonly known as:  FLONASE Place 2 sprays into both nostrils daily.   ibuprofen 800 MG tablet Commonly known as:  ADVIL,MOTRIN Take 1 tablet (800 mg total) by mouth 3 (three) times daily.   levothyroxine 50 MCG tablet Commonly known as:  SYNTHROID, LEVOTHROID Take 1 tablet (50 mcg total) by mouth daily.   ranitidine 75 MG tablet Commonly known as:  ZANTAC Take 1 tablet (75 mg  total) by mouth daily as needed for heartburn.   SUMAtriptan 50 MG tablet Commonly known as:  IMITREX Take 1 tablet (50 mg total) by mouth every 2 (two) hours as needed for migraine. May repeat in 2 hours if headache persists or recurs.            Discharge Care Instructions        Start     Ordered   07/07/17 0000  ALPRAZolam (XANAX) 0.5 MG tablet  2 times daily PRN    Question:  Supervising Provider  Answer:  Timmothy Euler   07/07/17 0826   07/07/17 0000  levothyroxine (SYNTHROID, LEVOTHROID) 50 MCG tablet  Daily    Question:  Supervising Provider  Answer:  Timmothy Euler   07/07/17 0826   07/07/17 0000  cyclobenzaprine (FLEXERIL)  10 MG tablet  3 times daily PRN    Question:  Supervising Provider  Answer:  Timmothy Euler   07/07/17 0826   07/07/17 0000  buPROPion (WELLBUTRIN XL) 150 MG 24 hr tablet  Daily    Question:  Supervising Provider  Answer:  Timmothy Euler   07/07/17 0826   07/07/17 0000  CMP14+EGFR     07/07/17 0826   07/07/17 0000  CBC with Differential/Platelet     07/07/17 0826   07/07/17 0000  TSH     07/07/17 0826   07/07/17 0000  Lipid panel     07/07/17 0826         Objective:    BP 98/66   Pulse 69   Temp (!) 97.3 F (36.3 C) (Oral)   Ht _0  (1.6 m)   Wt 246 lb (111.6 kg)   BMI 43.58 kg/m   Allergies  Allergen Reactions  . Tramadol     Vomiting and nausea    Physical Exam  Constitutional: She is oriented to person, place, and time. She appears well-developed and well-nourished.  HENT:  Head: Normocephalic and atraumatic.  Right Ear: Tympanic membrane, external ear and ear canal normal.  Left Ear: Tympanic membrane, external ear and ear canal normal.  Nose: Nose normal. No rhinorrhea.  Mouth/Throat: Oropharynx is clear and moist and mucous membranes are normal. No oropharyngeal exudate or posterior oropharyngeal erythema.  Eyes: Pupils are equal, round, and reactive to light. Conjunctivae and EOM are normal.  Neck: Normal range of motion. Neck supple.  Cardiovascular: Normal rate, regular rhythm, normal heart sounds and intact distal pulses.   Pulmonary/Chest: Effort normal and breath sounds normal.  Abdominal: Soft. Bowel sounds are normal.  Neurological: She is alert and oriented to person, place, and time. She has normal reflexes.  Skin: Skin is warm and dry. No rash noted.  Psychiatric: She has a normal mood and affect. Her behavior is normal. Judgment and thought content normal.  Nursing note and vitals reviewed.       Assessment & Plan:   1. GAD (generalized anxiety disorder) - ALPRAZolam (XANAX) 0.5 MG tablet; Take 1 tablet (0.5 mg total) by mouth 2  (two) times daily as needed for anxiety.  Dispense: 60 tablet; Refill: 2  2. Back strain, initial encounter - cyclobenzaprine (FLEXERIL) 10 MG tablet; Take 1 tablet (10 mg total) by mouth 3 (three) times daily as needed for muscle spasms.  Dispense: 30 tablet; Refill: 1  3. Well adult exam - CMP14+EGFR - CBC with Differential/Platelet - TSH - Lipid panel  4. Hypothyroidism, unspecified type - levothyroxine (SYNTHROID, LEVOTHROID) 50 MCG tablet; Take 1 tablet (50  mcg total) by mouth daily.  Dispense: 90 tablet; Refill: 3  5. Mood change (HCC) - buPROPion (WELLBUTRIN XL) 150 MG 24 hr tablet; Take 1-2 tablets (150-300 mg total) by mouth daily.  Dispense: 60 tablet; Refill: 1    Current Outpatient Prescriptions:  .  ALPRAZolam (XANAX) 0.5 MG tablet, Take 1 tablet (0.5 mg total) by mouth 2 (two) times daily as needed for anxiety., Disp: 60 tablet, Rfl: 2 .  cyclobenzaprine (FLEXERIL) 10 MG tablet, Take 1 tablet (10 mg total) by mouth 3 (three) times daily as needed for muscle spasms., Disp: 30 tablet, Rfl: 1 .  fluticasone (FLONASE) 50 MCG/ACT nasal spray, Place 2 sprays into both nostrils daily., Disp: 16 g, Rfl: 6 .  ibuprofen (ADVIL,MOTRIN) 800 MG tablet, Take 1 tablet (800 mg total) by mouth 3 (three) times daily., Disp: 21 tablet, Rfl: 0 .  levothyroxine (SYNTHROID, LEVOTHROID) 50 MCG tablet, Take 1 tablet (50 mcg total) by mouth daily., Disp: 90 tablet, Rfl: 3 .  ranitidine (ZANTAC) 75 MG tablet, Take 1 tablet (75 mg total) by mouth daily as needed for heartburn., Disp: 30 tablet, Rfl: 5 .  SUMAtriptan (IMITREX) 50 MG tablet, Take 1 tablet (50 mg total) by mouth every 2 (two) hours as needed for migraine. May repeat in 2 hours if headache persists or recurs., Disp: 10 tablet, Rfl: 6 .  buPROPion (WELLBUTRIN XL) 150 MG 24 hr tablet, Take 1-2 tablets (150-300 mg total) by mouth daily., Disp: 60 tablet, Rfl: 1 Continue all other maintenance medications as listed above.  Follow up  plan: Return in about 4 weeks (around 08/04/2017) for recheck.  Educational handout given for [place] PA-C [place] [address]  [place], [place] [address] [phone]   07/07/2017, 8:58 AM

## 2017-07-08 LAB — CBC WITH DIFFERENTIAL/PLATELET
BASOS ABS: 0 10*3/uL (ref 0.0–0.2)
Basos: 0 %
EOS (ABSOLUTE): 0.3 10*3/uL (ref 0.0–0.4)
EOS: 4 %
HEMATOCRIT: 42.8 % (ref 34.0–46.6)
HEMOGLOBIN: 14 g/dL (ref 11.1–15.9)
IMMATURE GRANS (ABS): 0 10*3/uL (ref 0.0–0.1)
IMMATURE GRANULOCYTES: 0 %
LYMPHS ABS: 2.2 10*3/uL (ref 0.7–3.1)
LYMPHS: 33 %
MCH: 29.7 pg (ref 26.6–33.0)
MCHC: 32.7 g/dL (ref 31.5–35.7)
MCV: 91 fL (ref 79–97)
MONOCYTES: 5 %
Monocytes Absolute: 0.4 10*3/uL (ref 0.1–0.9)
NEUTROS PCT: 58 %
Neutrophils Absolute: 3.8 10*3/uL (ref 1.4–7.0)
Platelets: 283 10*3/uL (ref 150–379)
RBC: 4.72 x10E6/uL (ref 3.77–5.28)
RDW: 13.5 % (ref 12.3–15.4)
WBC: 6.7 10*3/uL (ref 3.4–10.8)

## 2017-07-08 LAB — CMP14+EGFR
ALBUMIN: 3.7 g/dL (ref 3.5–5.5)
ALK PHOS: 77 IU/L (ref 39–117)
ALT: 16 IU/L (ref 0–32)
AST: 17 IU/L (ref 0–40)
Albumin/Globulin Ratio: 1.1 — ABNORMAL LOW (ref 1.2–2.2)
BUN / CREAT RATIO: 10 (ref 9–23)
BUN: 8 mg/dL (ref 6–24)
CHLORIDE: 102 mmol/L (ref 96–106)
CO2: 23 mmol/L (ref 20–29)
CREATININE: 0.82 mg/dL (ref 0.57–1.00)
Calcium: 9 mg/dL (ref 8.7–10.2)
GFR calc Af Amer: 103 mL/min/{1.73_m2} (ref >59)
GFR calc non Af Amer: 89 mL/min/{1.73_m2} (ref >59)
GLUCOSE: 96 mg/dL (ref 65–99)
Globulin, Total: 3.3 g/dL (ref 1.5–4.5)
Potassium: 4.7 mmol/L (ref 3.5–5.2)
Sodium: 138 mmol/L (ref 134–144)
Total Protein: 7 g/dL (ref 6.0–8.5)

## 2017-07-08 LAB — LIPID PANEL
CHOLESTEROL TOTAL: 163 mg/dL (ref 100–199)
Chol/HDL Ratio: 4.5 ratio — ABNORMAL HIGH (ref 0.0–4.4)
HDL: 36 mg/dL — ABNORMAL LOW (ref >39)
LDL CALC: 92 mg/dL (ref 0–99)
Triglycerides: 173 mg/dL — ABNORMAL HIGH (ref 0–149)
VLDL CHOLESTEROL CAL: 35 mg/dL (ref 5–40)

## 2017-07-08 LAB — TSH: TSH: 12.25 u[IU]/mL — AB (ref 0.450–4.500)

## 2017-07-09 ENCOUNTER — Other Ambulatory Visit: Payer: Self-pay | Admitting: Physician Assistant

## 2017-07-09 DIAGNOSIS — E039 Hypothyroidism, unspecified: Secondary | ICD-10-CM

## 2017-07-09 MED ORDER — LEVOTHYROXINE SODIUM 75 MCG PO TABS: 75.0000 ug | ORAL_TABLET | Freq: Every day | ORAL | 2 refills | Status: DC

## 2017-07-09 NOTE — Addendum Note (Signed)
Addended by: Tamera PuntWRAY, WENDY S on: 07/09/2017 09:35 AM   Modules accepted: Orders

## 2017-07-09 NOTE — Addendum Note (Signed)
Addended by: Tamera PuntWRAY, WENDY S on: 07/09/2017 09:50 AM   Modules accepted: Orders

## 2017-08-02 ENCOUNTER — Telehealth: Payer: 59 | Admitting: Family

## 2017-08-02 DIAGNOSIS — J302 Other seasonal allergic rhinitis: Secondary | ICD-10-CM

## 2017-08-02 MED ORDER — PREDNISONE 5 MG PO TABS: 5.0000 mg | ORAL_TABLET | ORAL | 0 refills | Status: DC

## 2017-08-02 NOTE — Progress Notes (Signed)
Thank you for the details you put in the comment boxes. Those details really help [place] take better care of you. You may see the template below containing some over-the-counter methods and other supportive care. I am sending you in a low-dose prednisone pack also as sometimes this lowers the inflammation enough to alleviate symptoms in addition to other treatments. This is not the typical treatment for allergies, of course, but for a serious flare-up, it can help a lot. Prednisone 6-day dose taper,  pack will be called in.   E visit for Allergic Rhinitis We are sorry that you are not feeling well.  Her is how we plan to help!  Based on what you have shared with me it looks like you have Allergic Rhinitis.  Rhinitis is when a reaction occurs that causes nasal congestion, runny nose, sneezing, and itching.  Most types of rhinitis are caused by an inflammation and are associated with symptoms in the eyes ears or throat. There are several types of rhinitis.  The most common are acute rhinitis, which is usually caused by a viral illness, allergic or seasonal rhinitis, and nonallergic or year-round rhinitis.  Nasal allergies occur certain times of the year.  Allergic rhinitis is caused when allergens in the air trigger the release of histamine in the body.  Histamine causes itching, swelling, and fluid to build up in the fragile linings of the nasal passages, sinuses and eyelids.  An itchy nose and clear discharge are common.  I recommend the following over the counter treatments: Xyzal 5 mg take 1 tablet daily  I also would recommend a nasal spray: (I see you have this on file Flonase 2 sprays into each nostril once daily  You may also benefit from eye drops such as: Systane 1-2 driops each eye twice daily as needed  HOME CARE:   You can use an over-the-counter saline nasal spray as needed  Avoid areas where there is heavy dust, mites, or molds  Stay indoors on windy days during the pollen  season  Keep windows closed in home, at least in bedroom; use air conditioner.  Use high-efficiency house air filter  Keep windows closed in car, turn AC on re-circulate  Avoid playing out with dog during pollen season  GET HELP RIGHT AWAY IF:   If your symptoms do not improve within 10 days  You become short of breath  You develop yellow or green discharge from your nose for over 3 days  You have coughing fits  MAKE SURE YOU:   Understand these instructions  Will watch your condition  Will get help right away if you are not doing well or get worse  Thank you for choosing an e-visit. Your e-visit answers were reviewed by a board certified advanced clinical practitioner to complete your personal care plan. Depending upon the condition, your plan could have included both over the counter or prescription medications. Please review your pharmacy choice. Be sure that the pharmacy you have chosen is open so that you can pick up your prescription now.  If there is a problem you may message your provider in MyChart to have the prescription routed to another pharmacy. Your safety is important to [place]. If you have drug allergies check your prescription carefully.  For the next 24 hours, you can use MyChart to ask questions about today's visit, request a non-urgent call back, or ask for a work or school excuse from your e-visit provider. You will get an email in the next  two days asking about your experience. I hope that your e-visit has been valuable and will speed your recovery.

## 2017-08-09 ENCOUNTER — Ambulatory Visit: Payer: 59 | Admitting: Physician Assistant

## 2017-08-18 ENCOUNTER — Ambulatory Visit: Payer: 59 | Admitting: Physician Assistant

## 2017-08-19 ENCOUNTER — Ambulatory Visit (INDEPENDENT_AMBULATORY_CARE_PROVIDER_SITE_OTHER): Payer: 59 | Admitting: Physician Assistant

## 2017-08-19 ENCOUNTER — Encounter: Payer: Self-pay | Admitting: Physician Assistant

## 2017-08-19 DIAGNOSIS — G43709 Chronic migraine without aura, not intractable, without status migrainosus: Secondary | ICD-10-CM | POA: Insufficient documentation

## 2017-08-19 DIAGNOSIS — G43109 Migraine with aura, not intractable, without status migrainosus: Secondary | ICD-10-CM

## 2017-08-19 DIAGNOSIS — R4586 Emotional lability: Secondary | ICD-10-CM | POA: Insufficient documentation

## 2017-08-19 MED ORDER — SUMATRIPTAN SUCCINATE 50 MG PO TABS: 50.0000 mg | ORAL_TABLET | ORAL | 11 refills | Status: DC | PRN

## 2017-08-19 MED ORDER — BUPROPION HCL ER (XL) 300 MG PO TB24: 300.0000 mg | ORAL_TABLET | Freq: Every day | ORAL | 3 refills | Status: DC

## 2017-08-19 NOTE — Patient Instructions (Signed)
In a few days you may receive a survey in the mail or online from [employer] regarding your visit with us today. Please take a moment to fill this out. Your feedback is very important to our whole office. It can help us better understand your needs as well as improve your experience and satisfaction. Thank you for taking your time to complete it. We care about you.  Gibril Mastro, PA-C  

## 2017-08-19 NOTE — Progress Notes (Signed)
Temp 97.9 F (36.6 C) (Oral)   Ht  (1.6 m)   Wt 242 lb (109.8 kg)   BMI 42.87 kg/m    Subjective:    Patient ID: Summer [name], female    DOB: 1976/08/12, 41 y.o.   MRN: [id number]  HPI: Summer [name] is a 41 y.o. female presenting on 08/19/2017 for 4 week rck (depression)  This patient comes in for periodic recheck on medications and conditions including Mood change, migraine. Reports that she has a great improvement in her mood and irritability.  She has tolerated the wellbutrin very well.  In addition she needs refills on her Imitrex today. She has done very well over the past year with it. Her prescription had run out.  All medications are reviewed today. There are no reports of any problems with the medications. All of the medical conditions are reviewed and updated.  Lab work is reviewed and will be ordered as medically necessary. There are no new problems reported with today's visit.   Relevant past medical, surgical, family and social history reviewed and updated as indicated. Allergies and medications reviewed and updated.  Past Medical History:  Diagnosis Date  . Anxiety   . Asthma   . GERD (gastroesophageal reflux disease)     Past Surgical History:  Procedure Laterality Date  . CHOLECYSTECTOMY N/A 02/21/2014   Procedure: LAPAROSCOPIC CHOLECYSTECTOMY;  Surgeon: Dalia Heading, MD;  Location: [facility];  Service: General;  Laterality: N/A;  . GALLBLADDER SURGERY    . TUBAL LIGATION    . TYMPANOSTOMY TUBE PLACEMENT Bilateral     Review of Systems  Constitutional: Negative.  Negative for activity change, fatigue and fever.  HENT: Negative.   Eyes: Negative.   Respiratory: Negative.  Negative for cough.   Cardiovascular: Negative.  Negative for chest pain.  Gastrointestinal: Negative.  Negative for abdominal pain.  Endocrine: Negative.   Genitourinary: Negative.  Negative for dysuria.  Musculoskeletal: Negative.   Skin: Negative.   Neurological:  Negative.   Psychiatric/Behavioral: Positive for agitation and dysphoric mood.    Allergies as of 08/19/2017      Reactions   Tramadol    Vomiting and nausea      Medication List       Accurate as of 08/19/17  1:48 PM. Always use your most recent med list.          ALPRAZolam 0.5 MG tablet Commonly known as:  XANAX Take 1 tablet (0.5 mg total) by mouth 2 (two) times daily as needed for anxiety.   buPROPion 300 MG 24 hr tablet Commonly known as:  WELLBUTRIN XL Take 1 tablet (300 mg total) by mouth daily.   cyclobenzaprine 10 MG tablet Commonly known as:  FLEXERIL Take 1 tablet (10 mg total) by mouth 3 (three) times daily as needed for muscle spasms.   fluticasone 50 MCG/ACT nasal spray Commonly known as:  FLONASE Place 2 sprays into both nostrils daily.   ibuprofen 800 MG tablet Commonly known as:  ADVIL,MOTRIN Take 1 tablet (800 mg total) by mouth 3 (three) times daily.   levothyroxine 75 MCG tablet Commonly known as:  SYNTHROID, LEVOTHROID Take 1 tablet (75 mcg total) by mouth daily.   ranitidine 75 MG tablet Commonly known as:  ZANTAC Take 1 tablet (75 mg total) by mouth daily as needed for heartburn.   SUMAtriptan 50 MG tablet Commonly known as:  IMITREX Take 1 tablet (50 mg total) by mouth every 2 (two) hours as  needed for migraine. May repeat in 2 hours if headache persists or recurs.          Objective:    Temp 97.9 F (36.6 C) (Oral)   Ht  (1.6 m)   Wt 242 lb (109.8 kg)   BMI 42.87 kg/m   Allergies  Allergen Reactions  . Tramadol     Vomiting and nausea    Physical Exam  Constitutional: She is oriented to person, place, and time. She appears well-developed and well-nourished.  HENT:  Head: Normocephalic and atraumatic.  Right Ear: Tympanic membrane, external ear and ear canal normal.  Left Ear: Tympanic membrane, external ear and ear canal normal.  Nose: Nose normal. No rhinorrhea.  Mouth/Throat: Oropharynx is clear and moist and  mucous membranes are normal. No oropharyngeal exudate or posterior oropharyngeal erythema.  Eyes: Pupils are equal, round, and reactive to light. Conjunctivae and EOM are normal.  Neck: Normal range of motion. Neck supple.  Cardiovascular: Normal rate, regular rhythm, normal heart sounds and intact distal pulses.   Pulmonary/Chest: Effort normal and breath sounds normal.  Abdominal: Soft. Bowel sounds are normal.  Neurological: She is alert and oriented to person, place, and time. She has normal reflexes.  Skin: Skin is warm and dry. No rash noted.  Psychiatric: She has a normal mood and affect. Her behavior is normal. Judgment and thought content normal.        Assessment & Plan:   1. Mood change - buPROPion (WELLBUTRIN XL) 300 MG 24 hr tablet; Take 1 tablet (300 mg total) by mouth daily.  Dispense: 90 tablet; Refill: 3  2. Migraine with aura and without status migrainosus, not intractable - SUMAtriptan (IMITREX) 50 MG tablet; Take 1 tablet (50 mg total) by mouth every 2 (two) hours as needed for migraine. May repeat in 2 hours if headache persists or recurs.  Dispense: 10 tablet; Refill: 11    Current Outpatient Prescriptions:  .  ALPRAZolam (XANAX) 0.5 MG tablet, Take 1 tablet (0.5 mg total) by mouth 2 (two) times daily as needed for anxiety., Disp: 60 tablet, Rfl: 2 .  buPROPion (WELLBUTRIN XL) 300 MG 24 hr tablet, Take 1 tablet (300 mg total) by mouth daily., Disp: 90 tablet, Rfl: 3 .  cyclobenzaprine (FLEXERIL) 10 MG tablet, Take 1 tablet (10 mg total) by mouth 3 (three) times daily as needed for muscle spasms., Disp: 30 tablet, Rfl: 1 .  fluticasone (FLONASE) 50 MCG/ACT nasal spray, Place 2 sprays into both nostrils daily., Disp: 16 g, Rfl: 6 .  ibuprofen (ADVIL,MOTRIN) 800 MG tablet, Take 1 tablet (800 mg total) by mouth 3 (three) times daily., Disp: 21 tablet, Rfl: 0 .  levothyroxine (SYNTHROID, LEVOTHROID) 75 MCG tablet, Take 1 tablet (75 mcg total) by mouth daily., Disp: 30  tablet, Rfl: 2 .  ranitidine (ZANTAC) 75 MG tablet, Take 1 tablet (75 mg total) by mouth daily as needed for heartburn., Disp: 30 tablet, Rfl: 5 .  SUMAtriptan (IMITREX) 50 MG tablet, Take 1 tablet (50 mg total) by mouth every 2 (two) hours as needed for migraine. May repeat in 2 hours if headache persists or recurs., Disp: 10 tablet, Rfl: 11 Continue all other maintenance medications as listed above.  Follow up plan: Return in about 6 months (around 02/17/2018) for recheck.  Educational handout given for survey  Remus Loffler PA-C [facility] [address]  [place], [place] [address] [phone]   08/19/2017, 1:48 PM

## 2017-09-15 ENCOUNTER — Telehealth: Payer: 59 | Admitting: Family

## 2017-09-15 DIAGNOSIS — J329 Chronic sinusitis, unspecified: Secondary | ICD-10-CM | POA: Diagnosis not present

## 2017-09-15 DIAGNOSIS — B9789 Other viral agents as the cause of diseases classified elsewhere: Secondary | ICD-10-CM

## 2017-09-15 NOTE — Progress Notes (Signed)
We are sorry that you are not feeling well.  Here is how we plan to help!  Based on what you have shared with me it looks like you have sinusitis.  Sinusitis is inflammation and infection in the sinus cavities of the head.  Based on your presentation I believe you most likely have Acute Viral Sinusitis.This is an infection most likely caused by a virus. There is not specific treatment for viral sinusitis other than to help you with the symptoms until the infection runs its course.  You may use an oral decongestant such as Mucinex D or if you have glaucoma or high blood pressure use plain Mucinex. Saline nasal spray help and can safely be used as often as needed for congestion, I have prescribed: Fluticasone nasal spray two sprays in each nostril twice a day  Some authorities believe that zinc sprays or the use of Echinacea may shorten the course of your symptoms.  Providers prescribe antibiotics to treat infections caused by bacteria. Antibiotics are very powerful in treating bacterial infections when they are used properly. To maintain their effectiveness, they should be used only when necessary. Overuse of antibiotics has resulted in the development of superbugs that are resistant to treatment!    After careful review of your answers, I would not recommend an antibiotic for your condition.  Antibiotics are not effective against viruses and therefore should not be used to treat them. Common examples of infections caused by viruses include colds and flu    Sinus infections are not as easily transmitted as other respiratory infection, however we still recommend that you avoid close contact with loved ones, especially the very young and elderly.  Remember to wash your hands thoroughly throughout the day as this is the number one way to prevent the spread of infection!  Home Care:  Only take medications as instructed by your medical team.  Complete the entire course of an antibiotic.  Do not take  these medications with alcohol.  A steam or ultrasonic humidifier can help congestion.  You can place a towel over your head and breathe in the steam from hot water coming from a faucet.  Avoid close contacts especially the very young and the elderly.  Cover your mouth when you cough or sneeze.  Always remember to wash your hands.  Get Help Right Away If:  You develop worsening fever or sinus pain.  You develop a severe head ache or visual changes.  Your symptoms persist after you have completed your treatment plan.  Make sure you  Understand these instructions.  Will watch your condition.  Will get help right away if you are not doing well or get worse.  Your e-visit answers were reviewed by a board certified advanced clinical practitioner to complete your personal care plan.  Depending on the condition, your plan could have included both over the counter or prescription medications.  If there is a problem please reply  once you have received a response from your provider.  Your safety is important to us.  If you have drug allergies check your prescription carefully.    You can use MyChart to ask questions about today's visit, request a non-urgent call back, or ask for a work or school excuse for 24 hours related to this e-Visit. If it has been greater than 24 hours you will need to follow up with your provider, or enter a new e-Visit to address those concerns.  You will get an e-mail in the next two  days asking about your experience.  I hope that your e-visit has been valuable and will speed your recovery. Thank you for using e-visits.    

## 2017-09-16 ENCOUNTER — Telehealth: Payer: 59 | Admitting: Family

## 2017-09-16 DIAGNOSIS — B9689 Other specified bacterial agents as the cause of diseases classified elsewhere: Secondary | ICD-10-CM | POA: Diagnosis not present

## 2017-09-16 DIAGNOSIS — J028 Acute pharyngitis due to other specified organisms: Secondary | ICD-10-CM

## 2017-09-16 MED ORDER — BENZONATATE 100 MG PO CAPS: 100.0000 mg | ORAL_CAPSULE | Freq: Three times a day (TID) | ORAL | 0 refills | Status: DC | PRN

## 2017-09-16 MED ORDER — AZITHROMYCIN 250 MG PO TABS: ORAL_TABLET | ORAL | 0 refills | Status: DC

## 2017-09-16 MED ORDER — PREDNISONE 5 MG PO TABS: 5.0000 mg | ORAL_TABLET | ORAL | 0 refills | Status: DC

## 2017-09-16 NOTE — Progress Notes (Signed)
Thank you for the details you included in the comment boxes. Those details are very helpful in determining the best course of treatment for you and help us to provide the best care. The template we use will say cough because you also have a cough, but we are treating the sinus infection and cough together.   We are sorry that you are not feeling well.  Here is how we plan to help!  Based on your presentation I believe you most likely have A cough due to bacteria.  When patients have a fever and a productive cough with a change in color or increased sputum production, we are concerned about bacterial bronchitis.  If left untreated it can progress to pneumonia.  If your symptoms do not improve with your treatment plan it is important that you contact your provider.   I have prescribed Azithromyin 250 mg: two tablets now and then one tablet daily for 4 additonal days    In addition you may use A non-prescription cough medication called Mucinex DM: take 2 tablets every 12 hours. and A prescription cough medication called Tessalon Perles 100mg . You may take 1-2 capsules every 8 hours as needed for your cough.  Sterapred 5 mg dosepak  From your responses in the eVisit questionnaire you describe inflammation in the upper respiratory tract which is causing a significant cough.  This is commonly called Bronchitis and has four common causes:    Allergies  Viral Infections  Acid Reflux  Bacterial Infection Allergies, viruses and acid reflux are treated by controlling symptoms or eliminating the cause. An example might be a cough caused by taking certain blood pressure medications. You stop the cough by changing the medication. Another example might be a cough caused by acid reflux. Controlling the reflux helps control the cough.  USE OF BRONCHODILATOR ("RESCUE") INHALERS: There is a risk from using your bronchodilator too frequently.  The risk is that over-reliance on a medication which only relaxes the  muscles surrounding the breathing tubes can reduce the effectiveness of medications prescribed to reduce swelling and congestion of the tubes themselves.  Although you feel brief relief from the bronchodilator inhaler, your asthma may actually be worsening with the tubes becoming more swollen and filled with mucus.  This can delay other crucial treatments, such as oral steroid medications. If you need to use a bronchodilator inhaler daily, several times per day, you should discuss this with your provider.  There are probably better treatments that could be used to keep your asthma under control.     HOME CARE . Only take medications as instructed by your medical team. . Complete the entire course of an antibiotic. . Drink plenty of fluids and get plenty of rest. . Avoid close contacts especially the very young and the elderly . Cover your mouth if you cough or cough into your sleeve. . Always remember to wash your hands . A steam or ultrasonic humidifier can help congestion.   GET HELP RIGHT AWAY IF: . You develop worsening fever. . You become short of breath . You cough up blood. . Your symptoms persist after you have completed your treatment plan MAKE SURE YOU   Understand these instructions.  Will watch your condition.  Will get help right away if you are not doing well or get worse.  Your e-visit answers were reviewed by a board certified advanced clinical practitioner to complete your personal care plan.  Depending on the condition, your plan could have included both  over the counter or prescription medications. If there is a problem please reply  once you have received a response from your provider. Your safety is important to us.  If you have drug allergies check your prescription carefully.    You can use MyChart to ask questions about today's visit, request a non-urgent call back, or ask for a work or school excuse for 24 hours related to this e-Visit. If it has been greater than  24 hours you will need to follow up with your provider, or enter a new e-Visit to address those concerns. You will get an e-mail in the next two days asking about your experience.  I hope that your e-visit has been valuable and will speed your recovery. Thank you for using e-visits.

## 2017-09-18 ENCOUNTER — Telehealth: Payer: 59 | Admitting: Nurse Practitioner

## 2017-09-18 DIAGNOSIS — R059 Cough, unspecified: Secondary | ICD-10-CM

## 2017-09-18 DIAGNOSIS — R05 Cough: Secondary | ICD-10-CM

## 2017-09-18 NOTE — Progress Notes (Signed)
I am so sorry but I cannot give you anything stronger for your cough in an e visit. We can not do codeine cough meds because has to be a signed paper rx you have to hand carry and we cannot dotose in an e visit. The only thing I can suggest is dayquil and nightquil.

## 2017-11-22 ENCOUNTER — Telehealth: Payer: 59 | Admitting: Family

## 2017-11-22 DIAGNOSIS — J029 Acute pharyngitis, unspecified: Secondary | ICD-10-CM

## 2017-11-22 MED ORDER — BENZONATATE 100 MG PO CAPS: 100.0000 mg | ORAL_CAPSULE | Freq: Three times a day (TID) | ORAL | 0 refills | Status: DC | PRN

## 2017-11-22 MED ORDER — PREDNISONE 5 MG PO TABS
5.0000 mg | ORAL_TABLET | ORAL | 0 refills | Status: DC
Start: 2017-11-22 — End: 2018-11-03

## 2017-11-22 NOTE — Progress Notes (Signed)
Thank you for the details you included in the comment boxes. Those details are very helpful in determining the best course of treatment for you and help [place] to provide the best care. The template below will say cough and sinus, but it has been modified to treat your specific illness.  We are sorry that you are not feeling well.  Here is how we plan to help!  Based on your presentation I believe you most likely have A cough due to a virus.  This is called viral bronchitis and is best treated by rest, plenty of fluids and control of the cough.  You may use Ibuprofen or Tylenol as directed to help your symptoms.     In addition you may use A non-prescription cough medication called Mucinex DM: take 2 tablets every 12 hours. and A prescription cough medication called Tessalon Perles 100mg . You may take 1-2 capsules every 8 hours as needed for your cough.  Sterapred 5 mg dosepak Along with Flonase, take 2 sprays in each nare daily.  From your responses in the eVisit questionnaire you describe inflammation in the upper respiratory tract which is causing a significant cough.  This is commonly called Bronchitis and has four common causes:    Allergies  Viral Infections  Acid Reflux  Bacterial Infection Allergies, viruses and acid reflux are treated by controlling symptoms or eliminating the cause. An example might be a cough caused by taking certain blood pressure medications. You stop the cough by changing the medication. Another example might be a cough caused by acid reflux. Controlling the reflux helps control the cough.  USE OF BRONCHODILATOR ("RESCUE") INHALERS: There is a risk from using your bronchodilator too frequently.  The risk is that over-reliance on a medication which only relaxes the muscles surrounding the breathing tubes can reduce the effectiveness of medications prescribed to reduce swelling and congestion of the tubes themselves.  Although you feel brief relief from the  bronchodilator inhaler, your asthma may actually be worsening with the tubes becoming more swollen and filled with mucus.  This can delay other crucial treatments, such as oral steroid medications. If you need to use a bronchodilator inhaler daily, several times per day, you should discuss this with your provider.  There are probably better treatments that could be used to keep your asthma under control.     HOME CARE . Only take medications as instructed by your medical team. . Complete the entire course of an antibiotic. . Drink plenty of fluids and get plenty of rest. . Avoid close contacts especially the very young and the elderly . Cover your mouth if you cough or cough into your sleeve. . Always remember to wash your hands . A steam or ultrasonic humidifier can help congestion.   GET HELP RIGHT AWAY IF: . You develop worsening fever. . You become short of breath . You cough up blood. . Your symptoms persist after you have completed your treatment plan MAKE SURE YOU   Understand these instructions.  Will watch your condition.  Will get help right away if you are not doing well or get worse.  Your e-visit answers were reviewed by a board certified advanced clinical practitioner to complete your personal care plan.  Depending on the condition, your plan could have included both over the counter or prescription medications. If there is a problem please reply  once you have received a response from your provider. Your safety is important to [place].  If you have drug  allergies check your prescription carefully.    You can use MyChart to ask questions about today's visit, request a non-urgent call back, or ask for a work or school excuse for 24 hours related to this e-Visit. If it has been greater than 24 hours you will need to follow up with your provider, or enter a new e-Visit to address those concerns. You will get an e-mail in the next two days asking about your experience.  I hope that  your e-visit has been valuable and will speed your recovery. Thank you for using e-visits.

## 2017-12-01 MED FILL — LEVOTHYROXINE 75 MCG TABLET: 75 | 30 days supply | Qty: 30 | Fill #0

## 2017-12-01 MED FILL — BUPROPION HCL XL 300 MG TAB: 300 | 90 days supply | Qty: 90 | Fill #0

## 2018-01-07 ENCOUNTER — Telehealth: Payer: 59 | Admitting: Family

## 2018-01-07 DIAGNOSIS — B9789 Other viral agents as the cause of diseases classified elsewhere: Secondary | ICD-10-CM | POA: Diagnosis not present

## 2018-01-07 DIAGNOSIS — J329 Chronic sinusitis, unspecified: Secondary | ICD-10-CM | POA: Diagnosis not present

## 2018-01-07 MED ORDER — FLUTICASONE PROPIONATE 50 MCG/ACT NA SUSP: 2.0000 | Freq: Every day | NASAL | 1 refills | Status: DC

## 2018-01-07 MED ORDER — PREDNISONE 5 MG PO TABS: 5.0000 mg | ORAL_TABLET | ORAL | 0 refills | Status: DC

## 2018-01-07 NOTE — Progress Notes (Signed)
Thank you for the details you included in the comment boxes. Those details are very helpful in determining the best course of treatment for you and help us to provide the best care.  We are sorry that you are not feeling well.  Here is how we plan to help!  Based on what you have shared with me it looks like you have sinusitis.  Sinusitis is inflammation and infection in the sinus cavities of the head.  Based on your presentation I believe you most likely have Acute Viral Sinusitis.This is an infection most likely caused by a virus. There is not specific treatment for viral sinusitis other than to help you with the symptoms until the infection runs its course.  You may use an oral decongestant such as Mucinex D or if you have glaucoma or high blood pressure use plain Mucinex. Saline nasal spray help and can safely be used as often as needed for congestion, I have prescribed: Fluticasone nasal spray two sprays in each nostril once a day   Along with a prednisone steroid pack to help with the ear pain/inflammation and to help any fluid drain from your ears.   Some authorities believe that zinc sprays or the use of Echinacea may shorten the course of your symptoms.  Sinus infections are not as easily transmitted as other respiratory infection, however we still recommend that you avoid close contact with loved ones, especially the very young and elderly.  Remember to wash your hands thoroughly throughout the day as this is the number one way to prevent the spread of infection!  Home Care:  Only take medications as instructed by your medical team.  Do not take these medications with alcohol.  A steam or ultrasonic humidifier can help congestion.  You can place a towel over your head and breathe in the steam from hot water coming from a faucet.  Avoid close contacts especially the very young and the elderly.  Cover your mouth when you cough or sneeze.  Always remember to wash your hands.  Get  Help Right Away If:  You develop worsening fever or sinus pain.  You develop a severe head ache or visual changes.  Your symptoms persist after you have completed your treatment plan.  Make sure you  Understand these instructions.  Will watch your condition.  Will get help right away if you are not doing well or get worse.  Your e-visit answers were reviewed by a board certified advanced clinical practitioner to complete your personal care plan.  Depending on the condition, your plan could have included both over the counter or prescription medications.  If there is a problem please reply  once you have received a response from your provider.  Your safety is important to us.  If you have drug allergies check your prescription carefully.    You can use MyChart to ask questions about today's visit, request a non-urgent call back, or ask for a work or school excuse for 24 hours related to this e-Visit. If it has been greater than 24 hours you will need to follow up with your provider, or enter a new e-Visit to address those concerns.  You will get an e-mail in the next two days asking about your experience.  I hope that your e-visit has been valuable and will speed your recovery. Thank you for using e-visits.

## 2018-01-22 ENCOUNTER — Telehealth: Payer: 59 | Admitting: Nurse Practitioner

## 2018-01-22 DIAGNOSIS — R197 Diarrhea, unspecified: Secondary | ICD-10-CM | POA: Diagnosis not present

## 2018-01-22 DIAGNOSIS — G43A0 Cyclical vomiting, not intractable: Secondary | ICD-10-CM

## 2018-01-22 DIAGNOSIS — R1115 Cyclical vomiting syndrome unrelated to migraine: Secondary | ICD-10-CM

## 2018-01-22 MED ORDER — ONDANSETRON HCL 4 MG PO TABS: 4.0000 mg | ORAL_TABLET | Freq: Three times a day (TID) | ORAL | 0 refills | Status: DC | PRN

## 2018-01-22 NOTE — Progress Notes (Signed)

## 2018-02-18 ENCOUNTER — Ambulatory Visit: Payer: 59 | Admitting: Physician Assistant

## 2018-05-14 ENCOUNTER — Telehealth: Payer: 59 | Admitting: Family

## 2018-05-14 DIAGNOSIS — B009 Herpesviral infection, unspecified: Secondary | ICD-10-CM

## 2018-05-14 MED ORDER — VALACYCLOVIR HCL 1 G PO TABS: 2000.0000 mg | ORAL_TABLET | Freq: Two times a day (BID) | ORAL | 0 refills | Status: DC

## 2018-05-14 NOTE — Progress Notes (Signed)
Thank you for the details you included in the comment boxes. Those details are very helpful in determining the best course of treatment for you and help us to provide the best care.  We are sorry that you are not feeling well.  Here is how we plan to help!  Based on what you have shared with me it does look like you have a viral infection.    Most cold sores or fever blisters are small fluid filled blisters around the mouth caused by herpes simplex virus.  The most common strain of the virus causing cold sores is herpes simplex virus 1.  It can be spread by skin contact, sharing eating utensils, or even sharing towels.  Cold sores are contagious to other people until dry. (Approximately 5-7 days).  Wash your hands. You can spread the virus to your eyes through handling your contact lenses after touching the lesions.  Most people experience pain at the sight or tingling sensations in their lips that may begin before the ulcers erupt.  Herpes simplex is treatable but not curable.  It may lie dormant for a long time and then reappear due to stress or prolonged sun exposure.  Many patients have success in treating their cold sores with an over the counter topical called Abreva.  You may apply the cream up to 5 times daily (maximum 10 days) until healing occurs.  If you would like to use an oral antiviral medication to speed the healing of your cold sore, I have sent a prescription to your local pharmacy Valacyclovir 2 gm twice daily for 1 day    HOME CARE:   Wash your hands frequently.  Do not pick at or rub the sore.  Don't open the blisters.  Avoid kissing other people during this time.  Avoid sharing drinking glasses, eating utensils, or razors.  Do not handle contact lenses unless you have thoroughly washed your hands with soap and warm water!  Avoid oral sex during this time.  Herpes from sores on your mouth can spread to your partner's genital area.  Avoid contact with anyone who has  eczema or a weakened immune system.  Cold sores are often triggered by exposure to intense sunlight, use a lip balm containing a sunscreen (SPF 30 or higher).  GET HELP RIGHT AWAY IF:   Blisters look infected.  Blisters occur near or in the eye.  Symptoms last longer than 10 days.  Your symptoms become worse.  MAKE SURE YOU:   Understand these instructions.  Will watch your condition.  Will get help right away if you are not doing well or get worse.    Your e-visit answers were reviewed by a board certified advanced clinical practitioner to complete your personal care plan.  Depending upon the condition, your plan could have  Included both over the counter or prescription medications.    Please review your pharmacy choice.  Be sure that the pharmacy you have chosen is open so that you can pick up your prescription now.  If there is a problem you csn message your provider in MyChart to have the prescription routed to another pharmacy.    Your safety is important to us.  If you have drug allergies check our prescription carefully.  For the next 24 hours you can use MyChart to ask questions about today's visit, request a non-urgent call back, or ask for a work or school excuse from your e-visit provider.  You will get an email in the   next two days asking about your experience.  I hope that your e-visit has been valuable and will speed your recovery.   

## 2018-09-15 ENCOUNTER — Other Ambulatory Visit: Payer: Self-pay | Admitting: Physician Assistant

## 2018-09-15 DIAGNOSIS — G43109 Migraine with aura, not intractable, without status migrainosus: Secondary | ICD-10-CM

## 2018-09-19 ENCOUNTER — Telehealth: Payer: 59 | Admitting: Family

## 2018-09-19 DIAGNOSIS — J329 Chronic sinusitis, unspecified: Secondary | ICD-10-CM

## 2018-09-19 DIAGNOSIS — G43909 Migraine, unspecified, not intractable, without status migrainosus: Secondary | ICD-10-CM

## 2018-09-19 NOTE — Progress Notes (Signed)
Based on what you shared with me it looks like you have a serious condition that should be evaluated in a face to face office visit.  NOTE: If you entered your credit card information for this eVisit, you will not be charged. You may see a "hold" on your card for the $30 but that hold will drop off and you will not have a charge processed.  If you are having a true medical emergency please call 911.  If you need an urgent face to face visit, [facility] has four urgent care centers for your convenience.  If you need care fast and have a high deductible or no insurance consider:   [email or web address] to reserve your spot online an avoid wait times  [facility] [address], [address] [place], [place] [address] 8 am to 8 pm Monday-Friday 10 am to 4 pm Saturday-Sunday *Across the street from [employer]  [facility]  [address] [place], [address] 8 am to 5 pm Monday-Friday * In the [facility] the [facility]   The following sites will take your  insurance:  . [facility]  [phone] Get Driving Directions Find a Provider at this Location  [address] [place], [place] [address] . 10 am to 8 pm Monday-Friday . 12 pm to 8 pm Saturday-Sunday   . [facility]  [phone] Get Driving Directions Find a Provider at this Location  [address], [address] [place], [place] [address] . 8 am to 8 pm Monday-Friday . 9 am to 6 pm Saturday . 11 am to 6 pm Sunday   . [facility] [place]  [phone] Get Driving Directions  [address] [place], [place] [address] . 8 am to 8 pm Monday-Friday . 8 am to 4 pm Saturday-Sunday   Your e-visit answers were reviewed by a board certified advanced clinical practitioner to complete your personal care plan.  Thank you for using e-Visits.  

## 2018-09-22 ENCOUNTER — Telehealth: Payer: 59 | Admitting: Family

## 2018-09-22 DIAGNOSIS — J111 Influenza due to unidentified influenza virus with other respiratory manifestations: Secondary | ICD-10-CM | POA: Diagnosis not present

## 2018-09-22 MED ORDER — OSELTAMIVIR PHOSPHATE 75 MG PO CAPS: 75.0000 mg | ORAL_CAPSULE | Freq: Two times a day (BID) | ORAL | 0 refills | Status: DC

## 2018-09-22 NOTE — Progress Notes (Signed)
Thank you for the details you included in the comment boxes. Those details are very helpful in determining the best course of treatment for you and help us to provide the best care.  E visit for Flu like symptoms   We are sorry that you are not feeling well.  Here is how we plan to help! Based on what you have shared with me it looks like you may have possible exposure to a virus that causes influenza.  Influenza or "the flu" is   an infection caused by a respiratory virus. The flu virus is highly contagious and persons who did not receive their yearly flu vaccination may "catch" the flu from close contact.  We have anti-viral medications to treat the viruses that cause this infection. They are not a "cure" and only shorten the course of the infection. These prescriptions are most effective when they are given within the first 2 days of "flu" symptoms. Antiviral medication are indicated if you have a high risk of complications from the flu. You should  also consider an antiviral medication if you are in close contact with someone who is at risk. These medications can help patients avoid complications from the flu  but have side effects that you should know. Possible side effects from Tamiflu or oseltamivir include nausea, vomiting, diarrhea, dizziness, headaches, eye redness, sleep problems or other respiratory symptoms. You should not take Tamiflu if you have an allergy to oseltamivir or any to the ingredients in Tamiflu.  Based upon your symptoms and potential risk factors I have prescribed Oseltamivir (Tamiflu).  It has been sent to your designated pharmacy.  You will take one 75 mg capsule orally twice a day for the next 5 days.  ANYONE WHO HAS FLU SYMPTOMS SHOULD: . Stay home. The flu is highly contagious and going out or to work exposes others! . Be sure to drink plenty of fluids. Water is fine as well as fruit juices, sodas and electrolyte beverages. You may want to stay away from caffeine or  alcohol. If you are nauseated, try taking small sips of liquids. How do you know if you are getting enough fluid? Your urine should be a pale yellow or almost colorless. . Get rest. . Taking a steamy shower or using a humidifier may help nasal congestion and ease sore throat pain. Using a saline nasal spray works much the same way. . Cough drops, hard candies and sore throat lozenges may ease your cough. . Line up a caregiver. Have someone check on you regularly.   GET HELP RIGHT AWAY IF: . You cannot keep down liquids or your medications. . You become short of breath . Your fell like you are going to pass out or loose consciousness. . Your symptoms persist after you have completed your treatment plan MAKE SURE YOU   Understand these instructions.  Will watch your condition.  Will get help right away if you are not doing well or get worse.  Your e-visit answers were reviewed by a board certified advanced clinical practitioner to complete your personal care plan.  Depending on the condition, your plan could have included both over the counter or prescription medications.  If there is a problem please reply  once you have received a response from your provider.  Your safety is important to us.  If you have drug allergies check your prescription carefully.    You can use MyChart to ask questions about today's visit, request a non-urgent call back, or ask   for a work or school excuse for 24 hours related to this e-Visit. If it has been greater than 24 hours you will need to follow up with your provider, or enter a new e-Visit to address those concerns.  You will get an e-mail in the next two days asking about your experience.  I hope that your e-visit has been valuable and will speed your recovery. Thank you for using e-visits.   

## 2018-11-03 ENCOUNTER — Telehealth: Payer: 59 | Admitting: Physician Assistant

## 2018-11-03 DIAGNOSIS — B001 Herpesviral vesicular dermatitis: Secondary | ICD-10-CM

## 2018-11-03 MED ORDER — VALACYCLOVIR HCL 1 G PO TABS
2000.0000 mg | ORAL_TABLET | Freq: Two times a day (BID) | ORAL | 0 refills | Status: DC
Start: 2018-11-03 — End: 2019-01-17

## 2018-11-03 NOTE — Progress Notes (Signed)
We are sorry that you are not feeling well.  Here is how we plan to help!  Based on what you have shared with me it does look like you have a viral infection.    Most cold sores or fever blisters are small fluid filled blisters around the mouth caused by herpes simplex virus.  The most common strain of the virus causing cold sores is herpes simplex virus 1.  It can be spread by skin contact, sharing eating utensils, or even sharing towels.  Cold sores are contagious to other people until dry. (Approximately 5-7 days).  Wash your hands. You can spread the virus to your eyes through handling your contact lenses after touching the lesions.  Most people experience pain at the sight or tingling sensations in their lips that may begin before the ulcers erupt.  Herpes simplex is treatable but not curable.  It may lie dormant for a long time and then reappear due to stress or prolonged sun exposure.  Many patients have success in treating their cold sores with an over the counter topical called Abreva.  You may apply the cream up to 5 times daily (maximum 10 days) until healing occurs.  If you would like to use an oral antiviral medication to speed the healing of your cold sore, I have sent a prescription to your local pharmacy Valacyclovir 2 gm twice daily for 1 day    HOME CARE:   Wash your hands frequently.  Do not pick at or rub the sore.  Don't open the blisters.  Avoid kissing other people during this time.  Avoid sharing drinking glasses, eating utensils, or razors.  Do not handle contact lenses unless you have thoroughly washed your hands with soap and warm water!  Avoid oral sex during this time.  Herpes from sores on your mouth can spread to your partner's genital area.  Avoid contact with anyone who has eczema or a weakened immune system.  Cold sores are often triggered by exposure to intense sunlight, use a lip balm containing a sunscreen (SPF 30 or higher).  GET HELP RIGHT AWAY  IF:   Blisters look infected.  Blisters occur near or in the eye.  Symptoms last longer than 10 days.  Your symptoms become worse.  MAKE SURE YOU:   Understand these instructions.  Will watch your condition.  Will get help right away if you are not doing well or get worse.    Your e-visit answers were reviewed by a board certified advanced clinical practitioner to complete your personal care plan.  Depending upon the condition, your plan could have  Included both over the counter or prescription medications.    Please review your pharmacy choice.  Be sure that the pharmacy you have chosen is open so that you can pick up your prescription now.  If there is a problem you can message your provider in MyChart to have the prescription routed to another pharmacy.    Your safety is important to us.  If you have drug allergies check our prescription carefully.  For the next 24 hours you can use MyChart to ask questions about today's visit, request a non-urgent call back, or ask for a work or school excuse from your e-visit provider.  You will get an email in the next two days asking about your experience.  I hope that your e-visit has been valuable and will speed your recovery.  

## 2018-11-14 ENCOUNTER — Telehealth: Payer: 59 | Admitting: Family Medicine

## 2018-11-14 ENCOUNTER — Encounter: Payer: Self-pay | Admitting: Family Medicine

## 2018-11-14 DIAGNOSIS — H60339 Swimmer's ear, unspecified ear: Secondary | ICD-10-CM

## 2018-11-14 MED ORDER — CIPROFLOXACIN-HYDROCORTISONE 0.2-1 % OT SUSP: 3.0000 [drp] | Freq: Two times a day (BID) | OTIC | 0 refills | Status: AC

## 2018-11-14 NOTE — Progress Notes (Signed)
E Visit for Swimmer's Ear  We are sorry that you are not feeling well. Here is how we plan to help!  I have prescribed: Ciprofloxin 0.2% and hydrocortisone 1% otic suspension 3 drops in affected ears twice daily for 7 days  In certain cases swimmer's ear may progress to a more serious bacterial infection of the middle or inner ear.  If you have a fever 102 and up and significantly worsening symptoms, this could indicate a more serious infection moving to the middle/inner and needs face to face evaluation in an office by a provider.  Your symptoms should improve over the next 3 days and should resolve in about 7 days.  HOME CARE:   Wash your hands frequently.  Do not place the tip of the bottle on your ear or touch it with your fingers.  You can take Acetominophen 650 mg every 4-6 hours as needed for pain.  If pain is severe or moderate, you can apply a heating pad (set on low) or hot water bottle (wrapped in a towel) to outer ear for 20 minutes.  This will also increase drainage.  Avoid ear plugs  Do not use Q-tips  After showers, help the water run out by tilting your head to one side.  GET HELP RIGHT AWAY IF:   Fever is over 102.2 degrees.  You develop progressive ear pain or hearing loss.  Ear symptoms persist longer than 3 days after treatment.  MAKE SURE YOU:   Understand these instructions.  Will watch your condition.  Will get help right away if you are not doing well or get worse.  TO PREVENT SWIMMER'S EAR:  Use a bathing cap or custom fitted swim molds to keep your ears dry.  Towel off after swimming to dry your ears.  Tilt your head or pull your earlobes to allow the water to escape your ear canal.  If there is still water in your ears, consider using a hairdryer on the lowest setting.  Thank you for choosing an e-visit. Your e-visit answers were reviewed by a board certified advanced clinical practitioner to complete your personal care plan. Depending  upon the condition, your plan could have included both over the counter or prescription medications. Please review your pharmacy choice. Be sure that the pharmacy you have chosen is open so that you can pick up your prescription now.  If there is a problem you may message your provider in MyChart to have the prescription routed to another pharmacy. Your safety is important to us. If you have drug allergies check your prescription carefully.  For the next 24 hours, you can use MyChart to ask questions about today's visit, request a non-urgent call back, or ask for a work or school excuse from your e-visit provider. You will get an email in the next two days asking about your experience. I hope that your e-visit has been valuable and will speed your recovery.       

## 2019-01-06 DIAGNOSIS — H5203 Hypermetropia, bilateral: Secondary | ICD-10-CM | POA: Diagnosis not present

## 2019-01-17 ENCOUNTER — Encounter: Payer: Self-pay | Admitting: Physician Assistant

## 2019-01-17 ENCOUNTER — Ambulatory Visit: Payer: 59 | Admitting: Physician Assistant

## 2019-01-17 VITALS — BP 101/68 | HR 80 | Temp 98.2°F | Ht 63.0 in | Wt 249.2 lb

## 2019-01-17 DIAGNOSIS — F411 Generalized anxiety disorder: Secondary | ICD-10-CM | POA: Diagnosis not present

## 2019-01-17 DIAGNOSIS — G43109 Migraine with aura, not intractable, without status migrainosus: Secondary | ICD-10-CM | POA: Diagnosis not present

## 2019-01-17 DIAGNOSIS — E039 Hypothyroidism, unspecified: Secondary | ICD-10-CM

## 2019-01-17 DIAGNOSIS — Z0000 Encounter for general adult medical examination without abnormal findings: Secondary | ICD-10-CM | POA: Diagnosis not present

## 2019-01-17 DIAGNOSIS — R4586 Emotional lability: Secondary | ICD-10-CM | POA: Diagnosis not present

## 2019-01-17 DIAGNOSIS — S39012D Strain of muscle, fascia and tendon of lower back, subsequent encounter: Secondary | ICD-10-CM | POA: Diagnosis not present

## 2019-01-17 MED ORDER — OMEPRAZOLE 20 MG PO CPDR: 20.0000 mg | DELAYED_RELEASE_CAPSULE | Freq: Every day | ORAL | 3 refills | Status: DC

## 2019-01-17 MED ORDER — LEVOTHYROXINE SODIUM 75 MCG PO TABS: 75.0000 ug | ORAL_TABLET | Freq: Every day | ORAL | 0 refills | Status: DC

## 2019-01-17 MED ORDER — ALPRAZOLAM 0.5 MG PO TABS: 0.5000 mg | ORAL_TABLET | Freq: Two times a day (BID) | ORAL | 2 refills | Status: DC | PRN

## 2019-01-17 MED ORDER — FLUTICASONE PROPIONATE 50 MCG/ACT NA SUSP: 2.0000 | Freq: Every day | NASAL | 3 refills | Status: DC

## 2019-01-17 MED ORDER — IBUPROFEN 800 MG PO TABS: 800.0000 mg | ORAL_TABLET | Freq: Three times a day (TID) | ORAL | 1 refills | Status: DC

## 2019-01-17 MED ORDER — BUPROPION HCL ER (XL) 300 MG PO TB24: 300.0000 mg | ORAL_TABLET | Freq: Every day | ORAL | 3 refills | Status: DC

## 2019-01-17 MED ORDER — SUMATRIPTAN SUCCINATE 50 MG PO TABS: 50.0000 mg | ORAL_TABLET | ORAL | 11 refills | Status: DC | PRN

## 2019-01-17 MED ORDER — CYCLOBENZAPRINE HCL 10 MG PO TABS: 10.0000 mg | ORAL_TABLET | Freq: Three times a day (TID) | ORAL | 1 refills | Status: DC | PRN

## 2019-01-17 MED FILL — FLUTICASONE PROP 50 MCG SPR: 50 | 90 days supply | Qty: 48 | Fill #0

## 2019-01-17 MED FILL — OMEPRAZOLE 20 MG CPDR: 20 | 90 days supply | Qty: 90 | Fill #0

## 2019-01-17 MED FILL — IBUPROFEN 800 MG TABS: 800 | 20 days supply | Qty: 60 | Fill #0 | Status: TO

## 2019-01-17 MED FILL — LEVOTHYROXINE 75 MCG TABLET: 75 | 90 days supply | Qty: 90 | Fill #0

## 2019-01-17 MED FILL — ALPRAZolam 0.5 MG TABS: 0.5 | 15 days supply | Qty: 30 | Fill #0 | Status: TO

## 2019-01-17 MED FILL — SUMAtriptan SUCCINATE 50 MG: 50 | 30 days supply | Qty: 10 | Fill #0

## 2019-01-17 MED FILL — CYCLOBENZAPRINE 10 MG TAB: 10 | 20 days supply | Qty: 60 | Fill #0 | Status: TO

## 2019-01-17 NOTE — Progress Notes (Addendum)
Breast cancer   BP 101/68   Pulse 80   Temp 98.2 F (36.8 C) (Oral)   Ht _0  (1.6 m)   Wt 249 lb 3.2 oz (113 kg)   BMI 44.14 kg/m    Subjective:    Patient ID: Summer [name], female    DOB: [birth year], 43 y.o.   MRN: [id number]  HPI: Summer [name] is a 43 y.o. female presenting on 01/17/2019 for Hypothyroidism and Anxiety  Patient comes in after many years of not being able to be seen. Her husband has been quite quite sick and was having a lot of care for him and with her full-time job she would like to get reestablished with our practice.   She reports her history is positive for mood change, hypothyroidism, migraine, back pain, generalized anxiety.  She does need refills on her medications.  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  She does have to do a lot of straining and lifting at times.  We are going to update her mammogram through [facility] imaging in the breast center.  And have told her that we can perform a Pap smear here in our office.  Past Medical History:  Diagnosis Date  . Anxiety   . Asthma   . GERD (gastroesophageal reflux disease)    Relevant past medical, surgical, family and social history reviewed and updated as indicated. Interim medical history since our last visit reviewed. Allergies and medications reviewed and updated. DATA REVIEWED: CHART IN EPIC  Family History reviewed for pertinent findings.  Review of Systems  Constitutional: Negative.  Negative for activity change, fatigue and fever.  HENT: Negative.   Eyes: Negative.   Respiratory: Negative.  Negative for cough.   Cardiovascular: Negative.  Negative for chest pain.  Gastrointestinal: Negative.  Negative for abdominal pain.  Endocrine: Negative.   Genitourinary: Negative.  Negative for dysuria.  Musculoskeletal: Positive for arthralgias and back pain.  Skin: Negative.   Neurological: Negative.     Psychiatric/Behavioral: Positive for decreased concentration. The patient is nervous/anxious.     Allergies as of 01/17/2019      Reactions   Tramadol    Vomiting and nausea      Medication List       Accurate as of January 17, 2019 11:59 PM. Always use your most recent med list.        ALPRAZolam 0.5 MG tablet Commonly known as:  XANAX Take 1 tablet (0.5 mg total) by mouth 2 (two) times daily as needed for anxiety.   buPROPion 300 MG 24 hr tablet Commonly known as:  WELLBUTRIN XL Take 1 tablet (300 mg total) by mouth daily.   cyclobenzaprine 10 MG tablet Commonly known as:  FLEXERIL Take 1 tablet (10 mg total) by mouth 3 (three) times daily as needed for muscle spasms.   fluticasone 50 MCG/ACT nasal spray Commonly known as:  FLONASE Place 2 sprays into both nostrils daily.   ibuprofen 800 MG tablet Commonly known as:  ADVIL,MOTRIN Take 1 tablet (800 mg total) by mouth 3 (three) times daily.   levothyroxine 75 MCG tablet Commonly known as:  SYNTHROID, LEVOTHROID Take 1 tablet (75 mcg total) by mouth daily.   omeprazole 20 MG capsule Commonly known as:  PRILOSEC Take 1 capsule (20 mg total) by mouth daily.   SUMAtriptan 50 MG tablet Commonly known as:  Imitrex Take 1 tablet (50 mg  total) by mouth every 2 (two) hours as needed for migraine. May repeat in 2 hours if headache persists or recurs.          Objective:    BP 101/68   Pulse 80   Temp 98.2 F (36.8 C) (Oral)   Ht _0  (1.6 m)   Wt 249 lb 3.2 oz (113 kg)   BMI 44.14 kg/m   Allergies  Allergen Reactions  . Tramadol     Vomiting and nausea    Wt Readings from Last 3 Encounters:  01/17/19 249 lb 3.2 oz (113 kg)  08/19/17 242 lb (109.8 kg)  07/07/17 246 lb (111.6 kg)    Physical Exam Constitutional:      Appearance: She is well-developed.  HENT:     Head: Normocephalic and atraumatic.  Eyes:     Conjunctiva/sclera: Conjunctivae normal.     Pupils: Pupils are equal, round, and reactive  to light.  Cardiovascular:     Rate and Rhythm: Normal rate and regular rhythm.     Heart sounds: Normal heart sounds.  Pulmonary:     Effort: Pulmonary effort is normal.     Breath sounds: Normal breath sounds.  Abdominal:     General: Bowel sounds are normal.     Palpations: Abdomen is soft.  Musculoskeletal:     Lumbar back: She exhibits decreased range of motion, tenderness, pain and spasm.  Skin:    General: Skin is warm and dry.     Findings: No rash.  Neurological:     Mental Status: She is alert and oriented to person, place, and time.     Deep Tendon Reflexes: Reflexes are normal and symmetric.  Psychiatric:        Behavior: Behavior normal.        Thought Content: Thought content normal.        Judgment: Judgment normal.     Results for orders placed or performed in visit on 01/17/19  CMP14+EGFR  Result Value Ref Range   Glucose 98 65 - 99 mg/dL   BUN 8 6 - 24 mg/dL   Creatinine, Ser 0.83 0.57 - 1.00 mg/dL   GFR calc non Af Amer 87 >59 mL/min/1.73   GFR calc Af Amer 100 >59 mL/min/1.73   BUN/Creatinine Ratio 10 9 - 23   Sodium 138 134 - 144 mmol/L   Potassium 4.6 3.5 - 5.2 mmol/L   Chloride 105 96 - 106 mmol/L   CO2 20 20 - 29 mmol/L   Calcium 9.4 8.7 - 10.2 mg/dL   Total Protein 7.2 6.0 - 8.5 g/dL   Albumin 4.0 3.8 - 4.8 g/dL   Globulin, Total 3.2 1.5 - 4.5 g/dL   Albumin/Globulin Ratio 1.3 1.2 - 2.2   Bilirubin Total 0.4 0.0 - 1.2 mg/dL   Alkaline Phosphatase 89 39 - 117 IU/L   AST 15 0 - 40 IU/L   ALT 15 0 - 32 IU/L  CBC with Differential/Platelet  Result Value Ref Range   WBC 7.7 3.4 - 10.8 x10E3/uL   RBC 4.54 3.77 - 5.28 x10E6/uL   Hemoglobin 14.0 11.1 - 15.9 g/dL   Hematocrit 40.8 34.0 - 46.6 %   MCV 90 79 - 97 fL   MCH 30.8 26.6 - 33.0 pg   MCHC 34.3 31.5 - 35.7 g/dL   RDW 12.4 11.7 - 15.4 %   Platelets 275 150 - 450 x10E3/uL   Neutrophils 58 Not Estab. %   Lymphs 32 Not Estab. %  Monocytes 6 Not Estab. %   Eos 3 Not Estab. %   Basos 1  Not Estab. %   Neutrophils Absolute 4.6 1.4 - 7.0 x10E3/uL   Lymphocytes Absolute 2.4 0.7 - 3.1 x10E3/uL   Monocytes Absolute 0.4 0.1 - 0.9 x10E3/uL   EOS (ABSOLUTE) 0.2 0.0 - 0.4 x10E3/uL   Basophils Absolute 0.0 0.0 - 0.2 x10E3/uL   Immature Granulocytes 0 Not Estab. %   Immature Grans (Abs) 0.0 0.0 - 0.1 x10E3/uL  Lipid panel  Result Value Ref Range   Cholesterol, Total 162 100 - 199 mg/dL   Triglycerides 161 (H) 0 - 149 mg/dL   HDL 35 (L) >39 mg/dL   VLDL Cholesterol Cal 32 5 - 40 mg/dL   LDL Calculated 95 0 - 99 mg/dL   Chol/HDL Ratio 4.6 (H) 0.0 - 4.4 ratio  TSH  Result Value Ref Range   TSH 6.550 (H) 0.450 - 4.500 uIU/mL      Assessment & Plan:   1. Mood change - buPROPion (WELLBUTRIN XL) 300 MG 24 hr tablet; Take 1 tablet (300 mg total) by mouth daily.  Dispense: 90 tablet; Refill: 3  2. Hypothyroidism, unspecified type - levothyroxine (SYNTHROID, LEVOTHROID) 75 MCG tablet; Take 1 tablet (75 mcg total) by mouth daily.  Dispense: 90 tablet; Refill: 0  3. Migraine with aura and without status migrainosus, not intractable - SUMAtriptan (IMITREX) 50 MG tablet; Take 1 tablet (50 mg total) by mouth every 2 (two) hours as needed for migraine. May repeat in 2 hours if headache persists or recurs.  Dispense: 10 tablet; Refill: 11  4. Back strain, subsequent encounter - cyclobenzaprine (FLEXERIL) 10 MG tablet; Take 1 tablet (10 mg total) by mouth 3 (three) times daily as needed for muscle spasms.  Dispense: 60 tablet; Refill: 1  5. GAD (generalized anxiety disorder) - ALPRAZolam (XANAX) 0.5 MG tablet; Take 1 tablet (0.5 mg total) by mouth 2 (two) times daily as needed for anxiety.  Dispense: 30 tablet; Refill: 2  6. Well adult exam - CMP14+EGFR - CBC with Differential/Platelet - Lipid panel - TSH   Continue all other maintenance medications as listed above.  Follow up plan: Return in about 2 years (around 01/16/2021).  Educational handout given for [place] PA-C [place] [address]  [place], [place] [address] [phone]   01/22/2019, 10:45 PM

## 2019-01-18 LAB — CMP14+EGFR
ALT: 15 IU/L (ref 0–32)
AST: 15 IU/L (ref 0–40)
Albumin/Globulin Ratio: 1.3 (ref 1.2–2.2)
Albumin: 4 g/dL (ref 3.8–4.8)
Alkaline Phosphatase: 89 IU/L (ref 39–117)
BUN/Creatinine Ratio: 10 (ref 9–23)
BUN: 8 mg/dL (ref 6–24)
Bilirubin Total: 0.4 mg/dL (ref 0.0–1.2)
CHLORIDE: 105 mmol/L (ref 96–106)
CO2: 20 mmol/L (ref 20–29)
Calcium: 9.4 mg/dL (ref 8.7–10.2)
Creatinine, Ser: 0.83 mg/dL (ref 0.57–1.00)
GFR calc Af Amer: 100 mL/min/{1.73_m2} (ref >59)
GFR calc non Af Amer: 87 mL/min/{1.73_m2} (ref >59)
Globulin, Total: 3.2 g/dL (ref 1.5–4.5)
Glucose: 98 mg/dL (ref 65–99)
Potassium: 4.6 mmol/L (ref 3.5–5.2)
Sodium: 138 mmol/L (ref 134–144)
Total Protein: 7.2 g/dL (ref 6.0–8.5)

## 2019-01-18 LAB — CBC WITH DIFFERENTIAL/PLATELET
Basophils Absolute: 0 10*3/uL (ref 0.0–0.2)
Basos: 1 %
EOS (ABSOLUTE): 0.2 10*3/uL (ref 0.0–0.4)
Eos: 3 %
Hematocrit: 40.8 % (ref 34.0–46.6)
Hemoglobin: 14 g/dL (ref 11.1–15.9)
Immature Grans (Abs): 0 10*3/uL (ref 0.0–0.1)
Immature Granulocytes: 0 %
Lymphocytes Absolute: 2.4 10*3/uL (ref 0.7–3.1)
Lymphs: 32 %
MCH: 30.8 pg (ref 26.6–33.0)
MCHC: 34.3 g/dL (ref 31.5–35.7)
MCV: 90 fL (ref 79–97)
Monocytes Absolute: 0.4 10*3/uL (ref 0.1–0.9)
Monocytes: 6 %
Neutrophils Absolute: 4.6 10*3/uL (ref 1.4–7.0)
Neutrophils: 58 %
Platelets: 275 10*3/uL (ref 150–450)
RBC: 4.54 x10E6/uL (ref 3.77–5.28)
RDW: 12.4 % (ref 11.7–15.4)
WBC: 7.7 10*3/uL (ref 3.4–10.8)

## 2019-01-18 LAB — LIPID PANEL
CHOLESTEROL TOTAL: 162 mg/dL (ref 100–199)
Chol/HDL Ratio: 4.6 ratio — ABNORMAL HIGH (ref 0.0–4.4)
HDL: 35 mg/dL — ABNORMAL LOW (ref >39)
LDL Calculated: 95 mg/dL (ref 0–99)
Triglycerides: 161 mg/dL — ABNORMAL HIGH (ref 0–149)
VLDL Cholesterol Cal: 32 mg/dL (ref 5–40)

## 2019-01-18 LAB — TSH: TSH: 6.55 u[IU]/mL — ABNORMAL HIGH (ref 0.450–4.500)

## 2019-01-19 MED FILL — buPROPion HCL ER (XL) 300 M: 300 | 90 days supply | Qty: 90 | Fill #0

## 2019-03-10 MED FILL — ALPRAZolam 0.5 MG TABS: 0.5 | 15 days supply | Qty: 30 | Fill #0

## 2019-03-10 MED FILL — CYCLOBENZAPRINE HCL 10 MG T: 10 | 20 days supply | Qty: 60 | Fill #0

## 2019-03-10 MED FILL — IBUPROFEN 800 MG TAB: 800 | 20 days supply | Qty: 60 | Fill #0

## 2019-03-20 ENCOUNTER — Encounter: Payer: Self-pay | Admitting: Physician Assistant

## 2019-03-20 ENCOUNTER — Ambulatory Visit (INDEPENDENT_AMBULATORY_CARE_PROVIDER_SITE_OTHER): Payer: 59 | Admitting: Physician Assistant

## 2019-03-20 ENCOUNTER — Other Ambulatory Visit: Payer: Self-pay

## 2019-03-20 DIAGNOSIS — F411 Generalized anxiety disorder: Secondary | ICD-10-CM | POA: Diagnosis not present

## 2019-03-20 DIAGNOSIS — E039 Hypothyroidism, unspecified: Secondary | ICD-10-CM | POA: Diagnosis not present

## 2019-03-20 DIAGNOSIS — G43109 Migraine with aura, not intractable, without status migrainosus: Secondary | ICD-10-CM | POA: Diagnosis not present

## 2019-03-20 MED ORDER — TOPIRAMATE 50 MG PO TABS: 50.0000 mg | ORAL_TABLET | Freq: Two times a day (BID) | ORAL | 2 refills | Status: DC

## 2019-03-20 NOTE — Progress Notes (Signed)
Telephone visit  Subjective: CC: Recheck on chronic conditions PCP: Remus Loffler, PA-C BUL:AGTXMI C Antonellis is a 43 y.o. female calls for telephone consult today. Patient provides verbal consent for consult held via phone.  Patient is identified with 2 separate identifiers.  At this time the entire area is on COVID-19 social distancing and stay home orders are in place.  Patient is of higher risk and therefore we are performing this by a virtual method.  Location of patient: Home Location of provider: HOME Others present for call: No  This patient is having visit for chronic follow-up on her medical conditions.  She does have hypothyroidism.  We need to perform labs.  The last check showed the thyroid still be slightly undertreated, and the medication was adjusted.  Hopefully this will be in the normal range at this time.  She is having a lot more migraines at this time.  She is having to wear masks at work and this causes to have more pulling on her head.  We have discussed prevention medication in the past.  She would like to try this.  I have explained the Topamax to her and the side effects.  And she would like to try the medication.   ROS: Per HPI  Allergies  Allergen Reactions  . Tramadol     Vomiting and nausea   Past Medical History:  Diagnosis Date  . Anxiety   . Asthma   . GERD (gastroesophageal reflux disease)     Current Outpatient Medications:  .  ALPRAZolam (XANAX) 0.5 MG tablet, Take 1 tablet (0.5 mg total) by mouth 2 (two) times daily as needed for anxiety., Disp: 30 tablet, Rfl: 2 .  buPROPion (WELLBUTRIN XL) 300 MG 24 hr tablet, Take 1 tablet (300 mg total) by mouth daily., Disp: 90 tablet, Rfl: 3 .  cyclobenzaprine (FLEXERIL) 10 MG tablet, Take 1 tablet (10 mg total) by mouth 3 (three) times daily as needed for muscle spasms., Disp: 60 tablet, Rfl: 1 .  fluticasone (FLONASE) 50 MCG/ACT nasal spray, Place 2 sprays into both nostrils daily., Disp: 48 g,  Rfl: 3 .  ibuprofen (ADVIL,MOTRIN) 800 MG tablet, Take 1 tablet (800 mg total) by mouth 3 (three) times daily., Disp: 60 tablet, Rfl: 1 .  levothyroxine (SYNTHROID, LEVOTHROID) 75 MCG tablet, Take 1 tablet (75 mcg total) by mouth daily., Disp: 90 tablet, Rfl: 0 .  omeprazole (PRILOSEC) 20 MG capsule, Take 1 capsule (20 mg total) by mouth daily., Disp: 90 capsule, Rfl: 3 .  SUMAtriptan (IMITREX) 50 MG tablet, Take 1 tablet (50 mg total) by mouth every 2 (two) hours as needed for migraine. May repeat in 2 hours if headache persists or recurs., Disp: 10 tablet, Rfl: 11 .  topiramate (TOPAMAX) 50 MG tablet, Take 1 tablet (50 mg total) by mouth 2 (two) times daily., Disp: 60 tablet, Rfl: 2  Assessment/ Plan: 43 y.o. female   1. Hypothyroidism, unspecified type - TSH; Future  2. Migraine with aura and without status migrainosus, not intractable - topiramate (TOPAMAX) 50 MG tablet; Take 1 tablet (50 mg total) by mouth 2 (two) times daily.  Dispense: 60 tablet; Refill: 2  3. GAD (generalized anxiety disorder) Continue medications   Start time: 9:03 AM End time: (: 12 AM  Meds ordered this encounter  Medications  . topiramate (TOPAMAX) 50 MG tablet    Sig: Take 1 tablet (50 mg total) by mouth 2 (two) times daily.    Dispense:  60  tablet    Refill:  2    Order Specific Question:   Supervising Provider    Answer:   Raliegh IpGOTTSCHALK, ASHLY M [[id number]]    Prudy FeelerAngel Tonilynn Bieker PA-C [facility] [phone]

## 2019-05-05 ENCOUNTER — Other Ambulatory Visit: Payer: Self-pay | Admitting: Physician Assistant

## 2019-05-05 ENCOUNTER — Other Ambulatory Visit: Payer: 59

## 2019-05-05 ENCOUNTER — Other Ambulatory Visit: Payer: Self-pay

## 2019-05-05 DIAGNOSIS — E039 Hypothyroidism, unspecified: Secondary | ICD-10-CM

## 2019-05-05 MED FILL — ALPRAZolam 0.5 MG TABS: 0.5 | 15 days supply | Qty: 30 | Fill #1

## 2019-05-05 MED FILL — OMEPRAZOLE 20 MG CPDR: 20 | 90 days supply | Qty: 90 | Fill #0

## 2019-05-05 MED FILL — LEVOTHYROXINE 75 MCG TABLET: 75 | 90 days supply | Qty: 90 | Fill #0

## 2019-05-06 LAB — TSH: TSH: 3.96 u[IU]/mL (ref 0.450–4.500)

## 2019-05-08 ENCOUNTER — Other Ambulatory Visit: Payer: Self-pay | Admitting: Physician Assistant

## 2019-05-08 ENCOUNTER — Telehealth: Payer: Self-pay | Admitting: *Deleted

## 2019-05-08 DIAGNOSIS — E039 Hypothyroidism, unspecified: Secondary | ICD-10-CM

## 2019-05-08 MED ORDER — LEVOTHYROXINE SODIUM 75 MCG PO TABS: 75.0000 ug | ORAL_TABLET | Freq: Every day | ORAL | 3 refills | Status: DC

## 2019-05-08 NOTE — Telephone Encounter (Signed)
-----   Message from Terald Sleeper, PA-C sent at 05/08/2019 10:02 AM EDT ----- Your thyroid is back to normal, please continue your medication. I have sent in a years refills to [facility].  Please call if there are any problems.

## 2019-05-08 NOTE — Telephone Encounter (Signed)
Pt notified of results Verbalizes understanding 

## 2019-05-15 MED FILL — TOPIRAMATE 50 MG TABLET: 50 | 30 days supply | Qty: 60 | Fill #0

## 2019-06-14 ENCOUNTER — Other Ambulatory Visit: Payer: Self-pay | Admitting: Physician Assistant

## 2019-06-14 MED FILL — buPROPion HCL ER (XL) 300 M: 300 | 90 days supply | Qty: 90 | Fill #0

## 2019-06-15 MED FILL — IBUPROFEN 800 MG TAB: 800 | 20 days supply | Qty: 60 | Fill #0

## 2019-06-26 ENCOUNTER — Encounter: Payer: Self-pay | Admitting: Physician Assistant

## 2019-06-26 ENCOUNTER — Telehealth: Payer: 59 | Admitting: Physician Assistant

## 2019-06-26 DIAGNOSIS — B353 Tinea pedis: Secondary | ICD-10-CM

## 2019-06-26 MED ORDER — FLUCONAZOLE 150 MG PO TABS: 150.0000 mg | ORAL_TABLET | ORAL | 0 refills | Status: DC

## 2019-06-26 NOTE — Progress Notes (Signed)
E-Visit for Athlete's Foot  We are sorry that you are not feeling well. Here is how we plan to help!  Based on what you shared with me it looks like you have tinea pedis, or "Athlete's Foot".  This type of rash can spread through shared towels, clothing, bedding, etc., as well as hard surfaces (particularly in moist areas) such as shower stalls, locker room floors, pool areas, etc. The symptoms of Athlete's Foot include red, swollen, peeling, itchy skin between the toes (especially between the pinky toe and the one next to it). The sole and heel of the foot may also be affected. In severe cases, the skin on the feet can blister.  Athlete's foot can usually be treated with over-the-counter topical antifungal products; but sometimes with chronic or extensive tinea pedis, prescription oral medications are needed.   Prescription medications are only indicated for an extensive rash or if over the counter treatments have failed.  I am prescribing:Fluconazole 150 mg once weekly for 3 weeks.  HOME CARE:  . Keep feet clean, dry, and cool. . Avoid using swimming pools, public showers, or foot baths. . Wear sandals when possible or air shoes out by alternating them every 2-3 days. . Avoid wearing closed shoes and wearing socks made from fabric that doesn't dry easily (for example, nylon). . Treat the infection with recommended medication  GET HELP RIGHT AWAY IF:  . Symptoms that don't go away after treatment. . Severe itching that persists. . If your rash spreads or swells. . If your rash begins to have drainage or smell. . You develop a fever.  MAKE SURE YOU   . Understand these instructions. . Will watch your condition. . Will get help right away if you are not doing well or get worse.   Thank you for choosing an e-visit.  Your e-visit answers were reviewed by a board certified advanced clinical practitioner to complete your personal care plan. Depending upon the condition, your  plan could have included both over the counter or prescription medications.  Please review your pharmacy choice. Make sure the pharmacy is open so you can pick up prescription now. If there is a problem, you may contact your provider through [employer] and have the prescription routed to another pharmacy.  Your safety is important to [place]. If you have drug allergies check your prescription carefully.   For the next 24 hours you can use MyChart to ask questions about today's visit, request a non-urgent call back, or ask for a work or school excuse.  You will get an email in the next two days asking about your experience. I hope that your e-visit has been valuable and will speed your recovery  References or for more information:  [employer]?search=athletes%[email or web address]%[email or web address]&source=search_result&selectedTitle=1~104&usage_type=default&display_rank=1  [email or web address]         I spent 5-10 minutes on review and completion of this note- Summer [name] [facility]

## 2019-06-28 MED FILL — TOPIRAMATE 50 MG TABLET: 50 | 30 days supply | Qty: 60 | Fill #1

## 2019-07-20 ENCOUNTER — Other Ambulatory Visit: Payer: Self-pay

## 2019-07-20 ENCOUNTER — Encounter: Payer: Self-pay | Admitting: Physician Assistant

## 2019-07-20 ENCOUNTER — Ambulatory Visit (INDEPENDENT_AMBULATORY_CARE_PROVIDER_SITE_OTHER): Payer: 59 | Admitting: Physician Assistant

## 2019-07-20 DIAGNOSIS — J302 Other seasonal allergic rhinitis: Secondary | ICD-10-CM

## 2019-07-20 DIAGNOSIS — J329 Chronic sinusitis, unspecified: Secondary | ICD-10-CM | POA: Diagnosis not present

## 2019-07-20 DIAGNOSIS — R040 Epistaxis: Secondary | ICD-10-CM

## 2019-07-20 MED ORDER — MONTELUKAST SODIUM 10 MG PO TABS: 10.0000 mg | ORAL_TABLET | Freq: Every day | ORAL | 3 refills | Status: DC

## 2019-07-20 MED ORDER — AMOXICILLIN-POT CLAVULANATE 875-125 MG PO TABS: 1.0000 | ORAL_TABLET | Freq: Two times a day (BID) | ORAL | 0 refills | Status: DC

## 2019-07-20 MED ORDER — FLUCONAZOLE 150 MG PO TABS: 150.0000 mg | ORAL_TABLET | ORAL | 0 refills | Status: DC

## 2019-07-20 NOTE — Progress Notes (Signed)
Telephone visit  Subjective: ZO:XWRUEACC:sinnus PCP: Remus LofflerJones, Ralphael Southgate S, PA-C VWU:[id number]:Summer [name] is a 43 y.o. female calls for telephone consult today. Patient provides verbal consent for consult held via phone.  Patient is identified with 2 separate identifiers.  At this time the entire area is on COVID-19 social distancing and stay home orders are in place.  Patient is of higher risk and therefore we are performing this by a virtual method.  Location of patient: home Location of provider: [facility] Others present for call: no  This patient has had many days of sinus headache and postnasal drainage. There is copious drainage at times. Denies any fever at this time. There has been a history of sinus infections in the past.  No history of sinus surgery. There is cough at night. It has become more prevalent in recent days.    ROS: Per HPI  Allergies  Allergen Reactions  . Tramadol     Vomiting and nausea   Past Medical History:  Diagnosis Date  . Anxiety   . Asthma   . GERD (gastroesophageal reflux disease)     Current Outpatient Medications:  .  ALPRAZolam (XANAX) 0.5 MG tablet, Take 1 tablet (0.5 mg total) by mouth 2 (two) times daily as needed for anxiety., Disp: 30 tablet, Rfl: 2 .  amoxicillin-clavulanate (AUGMENTIN) 875-125 MG tablet, Take 1 tablet by mouth 2 (two) times daily., Disp: 20 tablet, Rfl: 0 .  buPROPion (WELLBUTRIN XL) 300 MG 24 hr tablet, Take 1 tablet (300 mg total) by mouth daily., Disp: 90 tablet, Rfl: 3 .  cyclobenzaprine (FLEXERIL) 10 MG tablet, Take 1 tablet (10 mg total) by mouth 3 (three) times daily as needed for muscle spasms., Disp: 60 tablet, Rfl: 1 .  fluconazole (DIFLUCAN) 150 MG tablet, Take 1 tablet (150 mg total) by mouth once a week. Take once weekly for 3 weeks., Disp: 3 tablet, Rfl: 0 .  fluticasone (FLONASE) 50 MCG/ACT nasal spray, Place 2 sprays into both nostrils daily., Disp: 48 g, Rfl: 3 .  ibuprofen (ADVIL) 800 MG tablet, TAKE 1 TABLET BY MOUTH  3 TIMES DAILY., Disp: 60 tablet, Rfl: 0 .  levothyroxine (SYNTHROID) 75 MCG tablet, Take 1 tablet (75 mcg total) by mouth daily., Disp: 90 tablet, Rfl: 3 .  montelukast (SINGULAIR) 10 MG tablet, Take 1 tablet (10 mg total) by mouth at bedtime., Disp: 90 tablet, Rfl: 3 .  omeprazole (PRILOSEC) 20 MG capsule, Take 1 capsule (20 mg total) by mouth daily., Disp: 90 capsule, Rfl: 3 .  SUMAtriptan (IMITREX) 50 MG tablet, Take 1 tablet (50 mg total) by mouth every 2 (two) hours as needed for migraine. May repeat in 2 hours if headache persists or recurs., Disp: 10 tablet, Rfl: 11 .  topiramate (TOPAMAX) 50 MG tablet, Take 1 tablet (50 mg total) by mouth 2 (two) times daily., Disp: 60 tablet, Rfl: 2  Assessment/ Plan: 43 y.o. female   1. Sinusitis, unspecified chronicity, unspecified location - amoxicillin-clavulanate (AUGMENTIN) 875-125 MG tablet; Take 1 tablet by mouth 2 (two) times daily.  Dispense: 20 tablet; Refill: 0 - fluconazole (DIFLUCAN) 150 MG tablet; Take 1 tablet (150 mg total) by mouth once a week. Take once weekly for 3 weeks.  Dispense: 3 tablet; Refill: 0  2. Nosebleed - amoxicillin-clavulanate (AUGMENTIN) 875-125 MG tablet; Take 1 tablet by mouth 2 (two) times daily.  Dispense: 20 tablet; Refill: 0  3. Seasonal allergic rhinitis, unspecified trigger - montelukast (SINGULAIR) 10 MG tablet; Take 1 tablet (  10 mg total) by mouth at bedtime.  Dispense: 90 tablet; Refill: 3   No follow-ups on file.  Continue all other maintenance medications as listed above.  Start time: 8:40 Am End time: 8:48 Am  Meds ordered this encounter  Medications  . montelukast (SINGULAIR) 10 MG tablet    Sig: Take 1 tablet (10 mg total) by mouth at bedtime.    Dispense:  90 tablet    Refill:  3    Order Specific Question:   Supervising Provider    Answer:   Janora Norlander [[id number]]  . amoxicillin-clavulanate (AUGMENTIN) 875-125 MG tablet    Sig: Take 1 tablet by mouth 2 (two) times daily.     Dispense:  20 tablet    Refill:  0    Order Specific Question:   Supervising Provider    Answer:   Janora Norlander [[id number]]  . fluconazole (DIFLUCAN) 150 MG tablet    Sig: Take 1 tablet (150 mg total) by mouth once a week. Take once weekly for 3 weeks.    Dispense:  3 tablet    Refill:  0    Order Specific Question:   Supervising Provider    Answer:   Janora Norlander [[id number]]    Particia Nearing PA-C [place] [phone]

## 2019-08-17 ENCOUNTER — Other Ambulatory Visit: Payer: Self-pay | Admitting: Physician Assistant

## 2019-08-17 DIAGNOSIS — G43109 Migraine with aura, not intractable, without status migrainosus: Secondary | ICD-10-CM

## 2019-08-17 DIAGNOSIS — F411 Generalized anxiety disorder: Secondary | ICD-10-CM

## 2019-08-17 MED FILL — OMEPRAZOLE 20 MG CAP: 20 | 90 days supply | Qty: 90 | Fill #1

## 2019-08-22 ENCOUNTER — Ambulatory Visit (INDEPENDENT_AMBULATORY_CARE_PROVIDER_SITE_OTHER): Payer: 59 | Admitting: Physician Assistant

## 2019-08-22 DIAGNOSIS — F411 Generalized anxiety disorder: Secondary | ICD-10-CM | POA: Diagnosis not present

## 2019-08-22 DIAGNOSIS — G43109 Migraine with aura, not intractable, without status migrainosus: Secondary | ICD-10-CM

## 2019-08-22 MED ORDER — IBUPROFEN 800 MG PO TABS: 800.0000 mg | ORAL_TABLET | Freq: Three times a day (TID) | ORAL | 3 refills | Status: DC

## 2019-08-22 MED ORDER — TOPIRAMATE 50 MG PO TABS: 50.0000 mg | ORAL_TABLET | Freq: Two times a day (BID) | ORAL | 3 refills | Status: DC

## 2019-08-22 MED ORDER — ALPRAZOLAM 0.5 MG PO TABS: 0.5000 mg | ORAL_TABLET | Freq: Two times a day (BID) | ORAL | 2 refills | Status: DC | PRN

## 2019-08-22 MED FILL — ALPRAZolam 0.5 MG TABS: 0.5 | 15 days supply | Qty: 30 | Fill #0

## 2019-08-22 MED FILL — TOPIRAMATE 50 MG TABLET: 50 | 90 days supply | Qty: 180 | Fill #0

## 2019-08-22 MED FILL — IBUPROFEN 800 MG TAB: 800 | 90 days supply | Qty: 270 | Fill #0

## 2019-08-22 NOTE — Progress Notes (Signed)
Telephone visit  Subjective: Summer [name] migraine and generalized anxiety. PCP: Terald Sleeper, PA-C Summer [name] is a 43 y.o. female calls for telephone consult today. Patient provides verbal consent for consult held via phone.  Patient is identified with 2 separate identifiers.  At this time the entire area is on COVID-19 social distancing and stay home orders are in place.  Patient is of higher risk and therefore we are performing this by a virtual method.  Location of patient: Home Location of provider: HOME Others present for call: No  This patient is having a periodic recheck by telephone for her migraine and generalized anxiety disorder.  She states that she has had fairly well-controlled migraines except when she works.  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  She is however having fairly good control of her anxiety.  ANXIETY ASSESSMENT Cause of anxiety: Generalized anxiety disorder. This patient returns for a  month recheck on narcotic use for the above named condition(s)  Current medications-bupropion 300 mg 1 daily Presently on 0.5 mg 1 up to twice daily as needed for anxiety. Other medications tried: SSRIs Medication side effects-none Any concerns-no Any change in general medical condition-no Effectiveness of current meds-good PMP AWARE website reviewed: Yes Any suspicious activity on PMP Aware: No LME daily dose: not regularly taken  Contract on file  02/01/19 Last UDS  next visit  History of overdose or risk of abuse no   ROS: Per HPI  Allergies  Allergen Reactions  . Tramadol     Vomiting and nausea   Past Medical History:  Diagnosis Date  . Anxiety   . Asthma   . GERD (gastroesophageal reflux disease)     Current Outpatient Medications:  .  ALPRAZolam (XANAX) 0.5 MG tablet, Take 1 tablet (0.5 mg total) by mouth 2 (two) times daily as needed for anxiety., Disp: 30 tablet, Rfl: 2 .   buPROPion (WELLBUTRIN XL) 300 MG 24 hr tablet, Take 1 tablet (300 mg total) by mouth daily., Disp: 90 tablet, Rfl: 3 .  cyclobenzaprine (FLEXERIL) 10 MG tablet, Take 1 tablet (10 mg total) by mouth 3 (three) times daily as needed for muscle spasms., Disp: 60 tablet, Rfl: 1 .  fluconazole (DIFLUCAN) 150 MG tablet, Take 1 tablet (150 mg total) by mouth once a week. Take once weekly for 3 weeks., Disp: 3 tablet, Rfl: 0 .  fluticasone (FLONASE) 50 MCG/ACT nasal spray, Place 2 sprays into both nostrils daily., Disp: 48 g, Rfl: 3 .  ibuprofen (ADVIL) 800 MG tablet, Take 1 tablet (800 mg total) by mouth 3 (three) times daily., Disp: 270 tablet, Rfl: 3 .  levothyroxine (SYNTHROID) 75 MCG tablet, Take 1 tablet (75 mcg total) by mouth daily., Disp: 90 tablet, Rfl: 3 .  montelukast (SINGULAIR) 10 MG tablet, Take 1 tablet (10 mg total) by mouth at bedtime., Disp: 90 tablet, Rfl: 3 .  omeprazole (PRILOSEC) 20 MG capsule, Take 1 capsule (20 mg total) by mouth daily., Disp: 90 capsule, Rfl: 3 .  SUMAtriptan (IMITREX) 50 MG tablet, Take 1 tablet (50 mg total) by mouth every 2 (two) hours as needed for migraine. May repeat in 2 hours if headache persists or recurs., Disp: 10 tablet, Rfl: 11 .  topiramate (TOPAMAX) 50 MG tablet, Take 1 tablet (50 mg total) by mouth 2 (two) times daily., Disp: 180 tablet, Rfl: 3  Assessment/ Plan: 43 y.o. female   1. GAD (  generalized anxiety disorder) - ALPRAZolam (XANAX) 0.5 MG tablet; Take 1 tablet (0.5 mg total) by mouth 2 (two) times daily as needed for anxiety.  Dispense: 30 tablet; Refill: 2  2. Migraine with aura and without status migrainosus, not intractable - topiramate (TOPAMAX) 50 MG tablet; Take 1 tablet (50 mg total) by mouth 2 (two) times daily.  Dispense: 180 tablet; Refill: 3   No follow-ups on file.  Continue all other maintenance medications as listed above.  Start time: 3:33 PM End time: 3:46 PM  Meds ordered this encounter  Medications  . ALPRAZolam  (XANAX) 0.5 MG tablet    Sig: Take 1 tablet (0.5 mg total) by mouth 2 (two) times daily as needed for anxiety.    Dispense:  30 tablet    Refill:  2    Order Specific Question:   Supervising Provider    Answer:   Raliegh Ip [[id number]]  . topiramate (TOPAMAX) 50 MG tablet    Sig: Take 1 tablet (50 mg total) by mouth 2 (two) times daily.    Dispense:  180 tablet    Refill:  3    Order Specific Question:   Supervising Provider    Answer:   Raliegh Ip [[id number]]  . ibuprofen (ADVIL) 800 MG tablet    Sig: Take 1 tablet (800 mg total) by mouth 3 (three) times daily.    Dispense:  270 tablet    Refill:  3    Order Specific Question:   Supervising Provider    Answer:   Raliegh Ip [[id number]]    Prudy Feeler PA-C [facility] [phone]

## 2019-08-25 MED FILL — LEVOTHYROXINE 75 MCG TABLET: 75 | 90 days supply | Qty: 90 | Fill #0

## 2019-08-26 MED FILL — MONTELUKAST SOD 10 MG TAB: 10 | 90 days supply | Qty: 90 | Fill #0

## 2019-08-27 ENCOUNTER — Encounter: Payer: Self-pay | Admitting: Physician Assistant

## 2019-10-07 ENCOUNTER — Telehealth: Payer: 59 | Admitting: Physician Assistant

## 2019-10-07 DIAGNOSIS — H01004 Unspecified blepharitis left upper eyelid: Secondary | ICD-10-CM | POA: Diagnosis not present

## 2019-10-07 DIAGNOSIS — H00014 Hordeolum externum left upper eyelid: Secondary | ICD-10-CM | POA: Diagnosis not present

## 2019-10-07 MED ORDER — POLYMYXIN B-TRIMETHOPRIM 10000-0.1 UNIT/ML-% OP SOLN: OPHTHALMIC | 0 refills | Status: DC

## 2019-10-07 NOTE — Progress Notes (Signed)
We are sorry that you are not feeling well. Here is how we plan to help!  Based on what you have shared with me it looks like you have a stye.  A stye is an inflammation of the eyelid.  It is often a red, painful lump near the edge of the eyelid that may look like a boil or a pimple.  A stye develops when an infection occurs at the base of an eyelash.   We have made appropriate suggestions for you based upon your presentation: Simple styes can be treated without medical intervention.  Most styes either resolve spontaneously or resolve with simple home treatment by applying warm compresses or heated washcloth to the stye for about 10-15 minutes three to four times a day. This causes the stye to drain and resolve. Giving tenderness and redness noted there could be start of a mild infection in the eyelid itself, called blepharitis. I am sending you in a topical antibiotic to apply to the eye as directed.   HOME CARE:   Wash your hands often!  Let the stye open on its own. Don't squeeze or open it.  Don't rub your eyes. This can irritate your eyes and let in bacteria.  If you need to touch your eyes, wash your hands first.  Don't wear eye makeup or contact lenses until the area has healed.  GET HELP RIGHT AWAY IF:   Your symptoms do not improve.  You develop blurred or loss of vision.  Your symptoms worsen (increased discharge, pain or redness).  Thank you for choosing an e-visit.  Your e-visit answers were reviewed by a board certified advanced clinical practitioner to complete your personal care plan.  Depending upon the condition, your plan could have included both over the counter or prescription medications.  Please review your pharmacy choice.  Make sure the pharmacy is open so you can pick up prescription now.  If there is a problem, you may contact your provider through [employer] and have the prescription routed to another pharmacy.    Your safety is important to [place].  If  you have drug allergies check your prescription carefully.  For the next 24 hours you can use MyChart to ask questions about today's visit, request a non-urgent call back, or ask for a work or school excuse.  You will get an email in the next two days asking about your experience.  I hope you that your e-visit has been valuable and will speed your recovery.

## 2019-10-07 NOTE — Progress Notes (Signed)
I have spent 5 minutes in review of e-visit questionnaire, review and updating patient chart, medical decision making and response to patient.   Dominyck Reser Cody Chonda Baney, PA-C    

## 2019-10-20 MED FILL — BUPROPION HCL XL 300 MG TAB: 300 | 30 days supply | Qty: 30 | Fill #1

## 2019-10-20 MED FILL — MONTELUKAST SOD 10 MG TAB: 10 | 30 days supply | Qty: 30 | Fill #0

## 2019-11-14 ENCOUNTER — Ambulatory Visit (INDEPENDENT_AMBULATORY_CARE_PROVIDER_SITE_OTHER): Payer: 59 | Admitting: Nurse Practitioner

## 2019-11-14 DIAGNOSIS — J309 Allergic rhinitis, unspecified: Secondary | ICD-10-CM

## 2019-11-14 MED ORDER — LORATADINE 10 MG PO TABS: 10.0000 mg | ORAL_TABLET | Freq: Every day | ORAL | 11 refills | Status: DC

## 2019-11-14 MED ORDER — FLUTICASONE PROPIONATE 50 MCG/ACT NA SUSP: 2.0000 | Freq: Every day | NASAL | 6 refills | Status: DC

## 2019-11-14 NOTE — Progress Notes (Signed)
   Virtual Visit via telephone Note Due to COVID-19 pandemic this visit was conducted virtually. This visit type was conducted due to national recommendations for restrictions regarding the COVID-19 Pandemic (e.g. social distancing, sheltering in place) in an effort to limit this patient's exposure and mitigate transmission in our community. All issues noted in this document were discussed and addressed.  A physical exam was not performed with this format.  I connected with Summer [name] on 11/14/19 at 1:40 by telephone and verified that I am speaking with the correct person using two identifiers. Summer [name] is currently located at home and no one is currently with her during visit. The provider, Mary-Margaret Daphine Deutscher, FNP is located in their office at time of visit.  I discussed the limitations, risks, security and privacy concerns of performing an evaluation and management service by telephone and the availability of in person appointments. I also discussed with the patient that there may be a patient responsible charge related to this service. The patient expressed understanding and agreed to proceed.   History and Present Illness:   Chief Complaint: Sinusitis   HPI Patient calls in today c/o nasal congestion and watery and itchy eyes. Has bad allergies. She is taking her singulair and nasonex.     Review of Systems  Constitutional: Negative for chills and fever.  HENT: Positive for congestion. Negative for sore throat.   Respiratory: Negative for cough.   Cardiovascular: Negative.   Neurological: Negative.  Negative for dizziness and headaches.  Psychiatric/Behavioral: Negative.   All other systems reviewed and are negative.    Observations/Objective: Alert and oriented- answers all questions appropriately No distress    Assessment and Plan: Summer [name] in today with chief complaint of Sinusitis   1. Allergic rhinitis, unspecified seasonality, unspecified  trigger  Meds ordered this encounter  Medications  . loratadine (CLARITIN) 10 MG tablet    Sig: Take 1 tablet (10 mg total) by mouth daily.    Dispense:  30 tablet    Refill:  11    Order Specific Question:   Supervising Provider    Answer:   Arville Care A [id number]  . fluticasone (FLONASE) 50 MCG/ACT nasal spray    Sig: Place 2 sprays into both nostrils daily.    Dispense:  16 g    Refill:  6    Order Specific Question:   Supervising Provider    Answer:   Arville Care A [[id number]]   Stop nasonex Force flids OTC eye drops for watery itchy eyes    Follow Up Instructions: prn    I discussed the assessment and treatment plan with the patient. The patient was provided an opportunity to ask questions and all were answered. The patient agreed with the plan and demonstrated an understanding of the instructions.   The patient was advised to call back or seek an in-person evaluation if the symptoms worsen or if the condition fails to improve as anticipated.  The above assessment and management plan was discussed with the patient. The patient verbalized understanding of and has agreed to the management plan. Patient is aware to call the clinic if symptoms persist or worsen. Patient is aware when to return to the clinic for a follow-up visit. Patient educated on when it is appropriate to go to the emergency department.   Time call ended:  1:52 I provided 12 minutes of non-face-to-face time during this encounter.    Mary-Margaret Daphine Deutscher, FNP

## 2019-11-20 MED FILL — MONTELUKAST SOD 10 MG TAB: 10 | 30 days supply | Qty: 30 | Fill #1

## 2019-11-20 MED FILL — SUMATRIPTAN SUCC 50 MG TAB: 50 | 30 days supply | Qty: 10 | Fill #0

## 2019-11-20 MED FILL — BUPROPION HCL ER (XL) 300 M: 300 | 30 days supply | Qty: 30 | Fill #2

## 2019-11-20 MED FILL — TOPIRAMATE 50 MG TABLET: 50 | 90 days supply | Qty: 180 | Fill #1

## 2019-11-20 MED FILL — OMEPRAZOLE 20 MG CAP: 20 | 90 days supply | Qty: 90 | Fill #2

## 2019-11-20 MED FILL — IBUPROFEN 800 MG TAB: 800 | 90 days supply | Qty: 270 | Fill #1

## 2019-11-20 MED FILL — LEVOTHYROXINE SODIUM 75 MCG: 75 | 90 days supply | Qty: 90 | Fill #1

## 2019-11-28 ENCOUNTER — Other Ambulatory Visit: Payer: Self-pay | Admitting: Physician Assistant

## 2019-11-28 DIAGNOSIS — S338XXA Sprain of other parts of lumbar spine and pelvis, initial encounter: Secondary | ICD-10-CM | POA: Diagnosis not present

## 2019-11-28 DIAGNOSIS — S134XXA Sprain of ligaments of cervical spine, initial encounter: Secondary | ICD-10-CM | POA: Diagnosis not present

## 2019-11-28 DIAGNOSIS — S39012D Strain of muscle, fascia and tendon of lower back, subsequent encounter: Secondary | ICD-10-CM

## 2019-11-28 DIAGNOSIS — S233XXA Sprain of ligaments of thoracic spine, initial encounter: Secondary | ICD-10-CM | POA: Diagnosis not present

## 2019-11-28 MED FILL — CYCLOBENZAPRINE HCL 10 MG T: 10 | 20 days supply | Qty: 60 | Fill #0

## 2019-11-28 MED FILL — ALPRAZolam 0.5 MG TABS: 0.5 | 15 days supply | Qty: 30 | Fill #1

## 2019-12-01 DIAGNOSIS — S134XXA Sprain of ligaments of cervical spine, initial encounter: Secondary | ICD-10-CM | POA: Diagnosis not present

## 2019-12-01 DIAGNOSIS — S233XXA Sprain of ligaments of thoracic spine, initial encounter: Secondary | ICD-10-CM | POA: Diagnosis not present

## 2019-12-01 DIAGNOSIS — S338XXA Sprain of other parts of lumbar spine and pelvis, initial encounter: Secondary | ICD-10-CM | POA: Diagnosis not present

## 2019-12-12 DIAGNOSIS — S233XXA Sprain of ligaments of thoracic spine, initial encounter: Secondary | ICD-10-CM | POA: Diagnosis not present

## 2019-12-12 DIAGNOSIS — S134XXA Sprain of ligaments of cervical spine, initial encounter: Secondary | ICD-10-CM | POA: Diagnosis not present

## 2019-12-12 DIAGNOSIS — S338XXA Sprain of other parts of lumbar spine and pelvis, initial encounter: Secondary | ICD-10-CM | POA: Diagnosis not present

## 2020-01-01 ENCOUNTER — Other Ambulatory Visit (HOSPITAL_COMMUNITY): Payer: Self-pay | Admitting: Physician Assistant

## 2020-01-01 DIAGNOSIS — Z1231 Encounter for screening mammogram for malignant neoplasm of breast: Secondary | ICD-10-CM

## 2020-01-03 DIAGNOSIS — S233XXA Sprain of ligaments of thoracic spine, initial encounter: Secondary | ICD-10-CM | POA: Diagnosis not present

## 2020-01-03 DIAGNOSIS — S338XXA Sprain of other parts of lumbar spine and pelvis, initial encounter: Secondary | ICD-10-CM | POA: Diagnosis not present

## 2020-01-03 DIAGNOSIS — S134XXA Sprain of ligaments of cervical spine, initial encounter: Secondary | ICD-10-CM | POA: Diagnosis not present

## 2020-01-03 MED FILL — SUMATRIPTAN SUCC 50 MG TAB: 50 | 30 days supply | Qty: 10 | Fill #1

## 2020-01-03 MED FILL — BuPROPion HCL ER (XL) 300 M: 300 | 30 days supply | Qty: 30 | Fill #3

## 2020-01-03 MED FILL — MONTELUKAST SOD 10 MG TAB: 10 | 30 days supply | Qty: 30 | Fill #2

## 2020-01-04 ENCOUNTER — Ambulatory Visit (HOSPITAL_COMMUNITY)
Admission: RE | Admit: 2020-01-04 | Discharge: 2020-01-04 | Disposition: A | Discharge status: Home / Self Care | Payer: 59 | Source: Ambulatory Visit | Attending: Physician Assistant | Admitting: Physician Assistant

## 2020-01-04 ENCOUNTER — Other Ambulatory Visit: Payer: Self-pay

## 2020-01-04 ENCOUNTER — Encounter (HOSPITAL_COMMUNITY): Payer: Self-pay

## 2020-01-04 DIAGNOSIS — Z1231 Encounter for screening mammogram for malignant neoplasm of breast: Secondary | ICD-10-CM | POA: Diagnosis not present

## 2020-01-22 ENCOUNTER — Encounter: Payer: Self-pay | Admitting: Physician Assistant

## 2020-01-22 ENCOUNTER — Other Ambulatory Visit: Payer: Self-pay | Admitting: Physician Assistant

## 2020-01-22 ENCOUNTER — Telehealth: Payer: 59 | Admitting: Physician Assistant

## 2020-01-22 DIAGNOSIS — R21 Rash and other nonspecific skin eruption: Secondary | ICD-10-CM

## 2020-01-22 MED ORDER — VALACYCLOVIR HCL 1 G PO TABS: 2000.0000 mg | ORAL_TABLET | Freq: Two times a day (BID) | ORAL | 11 refills | Status: DC

## 2020-01-22 MED ORDER — VALACYCLOVIR HCL 1 G PO TABS: 2000.0000 mg | ORAL_TABLET | Freq: Two times a day (BID) | ORAL | 0 refills | Status: DC

## 2020-01-22 NOTE — Progress Notes (Signed)
We are sorry that you are not feeling well.  Here is how we plan to help!  Based on what you have shared with me it does look like you have a viral infection.    Most cold sores or fever blisters are small fluid filled blisters around the mouth caused by herpes simplex virus.  The most common strain of the virus causing cold sores is herpes simplex virus 1.  It can be spread by skin contact, sharing eating utensils, or even sharing towels.  Cold sores are contagious to other people until dry. (Approximately 5-7 days).  Wash your hands. You can spread the virus to your eyes through handling your contact lenses after touching the lesions.  Most people experience pain at the sight or tingling sensations in their lips that may begin before the ulcers erupt.  Herpes simplex is treatable but not curable.  It may lie dormant for a long time and then reappear due to stress or prolonged sun exposure.  Many patients have success in treating their cold sores with an over the counter topical called Abreva.  You may apply the cream up to 5 times daily (maximum 10 days) until healing occurs.  If you would like to use an oral antiviral medication to speed the healing of your cold sore, I have sent a prescription to your local pharmacy Valacyclovir 2 gm take by mouth twice a day for 1 day    HOME CARE:   Wash your hands frequently.  Do not pick at or rub the sore.  Don't open the blisters.  Avoid kissing other people during this time.  Avoid sharing drinking glasses, eating utensils, or razors.  Do not handle contact lenses unless you have thoroughly washed your hands with soap and warm water!  Avoid oral sex during this time.  Herpes from sores on your mouth can spread to your partner's genital area.  Avoid contact with anyone who has eczema or a weakened immune system.  Cold sores are often triggered by exposure to intense sunlight, use a lip balm containing a sunscreen (SPF 30 or higher).  GET  HELP RIGHT AWAY IF:   Blisters look infected.  Blisters occur near or in the eye.  Symptoms last longer than 10 days.  Your symptoms become worse.  MAKE SURE YOU:   Understand these instructions.  Will watch your condition.  Will get help right away if you are not doing well or get worse.    Your e-visit answers were reviewed by a board certified advanced clinical practitioner to complete your personal care plan.  Depending upon the condition, your plan could have  Included both over the counter or prescription medications.    Please review your pharmacy choice.  Be sure that the pharmacy you have chosen is open so that you can pick up your prescription now.  If there is a problem you can message your provider in MyChart to have the prescription routed to another pharmacy.    Your safety is important to [place].  If you have drug allergies check our prescription carefully.  For the next 24 hours you can use MyChart to ask questions about today's visit, request a non-urgent call back, or ask for a work or school excuse from your e-visit provider.  You will get an email in the next two days asking about your experience.  I hope that your e-visit has been valuable and will speed your recovery.  Greater than 5 minutes, yet less than 10  minutes of time have been spent researching, coordinating, and implementing care for this patient today.

## 2020-02-27 MED FILL — MONTELUKAST SOD 10 MG TAB: 10 | 30 days supply | Qty: 30 | Fill #3

## 2020-02-28 ENCOUNTER — Ambulatory Visit (INDEPENDENT_AMBULATORY_CARE_PROVIDER_SITE_OTHER): Payer: 59 | Admitting: Family Medicine

## 2020-02-28 ENCOUNTER — Encounter: Payer: Self-pay | Admitting: Family Medicine

## 2020-02-28 DIAGNOSIS — J329 Chronic sinusitis, unspecified: Secondary | ICD-10-CM

## 2020-02-28 MED ORDER — PREDNISONE 20 MG PO TABS: ORAL_TABLET | ORAL | 0 refills | Status: DC

## 2020-02-28 MED ORDER — AZITHROMYCIN 250 MG PO TABS: ORAL_TABLET | ORAL | 0 refills | Status: DC

## 2020-02-28 NOTE — Progress Notes (Signed)
   Virtual Visit via telephone Note  I connected with Summer [name] on 02/28/20 at 1117 by telephone and verified that I am speaking with the correct person using two identifiers. Summer [name] is currently located at home and no other people are currently with her during visit. The provider, Elige Radon Niko Penson, MD is located in their office at time of visit.  Call ended at 1132  I discussed the limitations, risks, security and privacy concerns of performing an evaluation and management service by telephone and the availability of in person appointments. I also discussed with the patient that there may be a patient responsible charge related to this service. The patient expressed understanding and agreed to proceed.   History and Present Illness: Patient is calling in with complaints of 3 days ago of being stopped up and having wheezing and congestion.  She is taking robitussin and claritin.  She has a low grade temperature.  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  She is taking claritin and singulair.  She is taking a decongestant and cough medicine.  She is having some achiness as well.  No diagnosis found.    Review of Systems  Constitutional: Negative for chills and fever.  HENT: Positive for congestion, postnasal drip, rhinorrhea, sinus pressure, sneezing and sore throat. Negative for ear discharge and ear pain.   Eyes: Negative for pain, redness and visual disturbance.  Respiratory: Positive for cough and wheezing. Negative for chest tightness and shortness of breath.   Cardiovascular: Negative for chest pain and leg swelling.  Genitourinary: Negative for difficulty urinating and dysuria.  Musculoskeletal: Negative for back pain and gait problem.  Skin: Negative for rash.  Neurological: Negative for light-headedness and headaches.  Psychiatric/Behavioral: Negative for agitation and behavioral problems.  All other systems reviewed and are  negative.   Observations/Objective: Patient sounds comfortable and in no acute distress  Assessment and Plan: Problem List Items Addressed This Visit    None    Visit Diagnoses    Sinusitis, unspecified chronicity, unspecified location    -  Primary   Relevant Medications   azithromycin (ZITHROMAX) 250 MG tablet   predniSONE (DELTASONE) 20 MG tablet      Quarantine and covid testing   Follow up plan: Return if symptoms worsen or fail to improve.     I discussed the assessment and treatment plan with the patient. The patient was provided an opportunity to ask questions and all were answered. The patient agreed with the plan and demonstrated an understanding of the instructions.   The patient was advised to call back or seek an in-person evaluation if the symptoms worsen or if the condition fails to improve as anticipated.  The above assessment and management plan was discussed with the patient. The patient verbalized understanding of and has agreed to the management plan. Patient is aware to call the clinic if symptoms persist or worsen. Patient is aware when to return to the clinic for a follow-up visit. Patient educated on when it is appropriate to go to the emergency department.    I provided 15 minutes of non-face-to-face time during this encounter.    Nils Pyle, MD

## 2020-05-13 ENCOUNTER — Other Ambulatory Visit: Payer: Self-pay | Admitting: Family Medicine

## 2020-05-13 DIAGNOSIS — R4586 Emotional lability: Secondary | ICD-10-CM

## 2020-05-13 DIAGNOSIS — E039 Hypothyroidism, unspecified: Secondary | ICD-10-CM

## 2020-05-13 MED FILL — IBUPROFEN 800 MG TAB: 800 | 90 days supply | Qty: 270 | Fill #2

## 2020-05-13 MED FILL — TOPIRAMATE 50 MG TABLET: 50 | 90 days supply | Qty: 180 | Fill #2

## 2020-05-21 ENCOUNTER — Encounter: Payer: Self-pay | Admitting: Nurse Practitioner

## 2020-05-21 ENCOUNTER — Emergency Department (HOSPITAL_COMMUNITY): Payer: 59

## 2020-05-21 ENCOUNTER — Encounter (HOSPITAL_COMMUNITY): Payer: Self-pay | Admitting: Emergency Medicine

## 2020-05-21 ENCOUNTER — Emergency Department (HOSPITAL_COMMUNITY)
Admission: EM | Admit: 2020-05-21 | Discharge: 2020-05-21 | Disposition: A | Discharge status: Home / Self Care | Payer: 59 | Attending: Emergency Medicine | Admitting: Emergency Medicine

## 2020-05-21 ENCOUNTER — Ambulatory Visit (INDEPENDENT_AMBULATORY_CARE_PROVIDER_SITE_OTHER): Payer: 59 | Admitting: Nurse Practitioner

## 2020-05-21 ENCOUNTER — Other Ambulatory Visit: Payer: Self-pay

## 2020-05-21 DIAGNOSIS — G43909 Migraine, unspecified, not intractable, without status migrainosus: Secondary | ICD-10-CM | POA: Diagnosis not present

## 2020-05-21 DIAGNOSIS — E039 Hypothyroidism, unspecified: Secondary | ICD-10-CM | POA: Diagnosis not present

## 2020-05-21 DIAGNOSIS — Z79890 Hormone replacement therapy (postmenopausal): Secondary | ICD-10-CM | POA: Diagnosis not present

## 2020-05-21 DIAGNOSIS — G43111 Migraine with aura, intractable, with status migrainosus: Secondary | ICD-10-CM | POA: Diagnosis not present

## 2020-05-21 DIAGNOSIS — J45909 Unspecified asthma, uncomplicated: Secondary | ICD-10-CM | POA: Insufficient documentation

## 2020-05-21 DIAGNOSIS — Z7951 Long term (current) use of inhaled steroids: Secondary | ICD-10-CM | POA: Diagnosis not present

## 2020-05-21 DIAGNOSIS — R42 Dizziness and giddiness: Secondary | ICD-10-CM | POA: Insufficient documentation

## 2020-05-21 DIAGNOSIS — R519 Headache, unspecified: Secondary | ICD-10-CM | POA: Diagnosis not present

## 2020-05-21 DIAGNOSIS — Z87891 Personal history of nicotine dependence: Secondary | ICD-10-CM | POA: Insufficient documentation

## 2020-05-21 LAB — CBC WITH DIFFERENTIAL/PLATELET
Abs Immature Granulocytes: 0.03 10*3/uL (ref 0.00–0.07)
Basophils Absolute: 0.1 10*3/uL (ref 0.0–0.1)
Basophils Relative: 1 %
Eosinophils Absolute: 0.3 10*3/uL (ref 0.0–0.5)
Eosinophils Relative: 3 %
HCT: 45.4 % (ref 36.0–46.0)
Hemoglobin: 14.5 g/dL (ref 12.0–15.0)
Immature Granulocytes: 0 %
Lymphocytes Relative: 31 %
Lymphs Abs: 2.9 10*3/uL (ref 0.7–4.0)
MCH: 30 pg (ref 26.0–34.0)
MCHC: 31.9 g/dL (ref 30.0–36.0)
MCV: 93.8 fL (ref 80.0–100.0)
Monocytes Absolute: 0.6 10*3/uL (ref 0.1–1.0)
Monocytes Relative: 6 %
Neutro Abs: 5.4 10*3/uL (ref 1.7–7.7)
Neutrophils Relative %: 59 %
Platelets: 290 10*3/uL (ref 150–400)
RBC: 4.84 MIL/uL (ref 3.87–5.11)
RDW: 12.5 % (ref 11.5–15.5)
WBC: 9.2 10*3/uL (ref 4.0–10.5)
nRBC: 0 % (ref 0.0–0.2)

## 2020-05-21 LAB — COMPREHENSIVE METABOLIC PANEL
ALT: 17 U/L (ref 0–44)
AST: 19 U/L (ref 15–41)
Albumin: 3.6 g/dL (ref 3.5–5.0)
Alkaline Phosphatase: 81 U/L (ref 38–126)
Anion gap: 9 (ref 5–15)
BUN: 11 mg/dL (ref 6–20)
CO2: 24 mmol/L (ref 22–32)
Calcium: 8.6 mg/dL — ABNORMAL LOW (ref 8.9–10.3)
Chloride: 104 mmol/L (ref 98–111)
Creatinine, Ser: 0.83 mg/dL (ref 0.44–1.00)
GFR calc Af Amer: >60 mL/min (ref >60)
GFR calc non Af Amer: >60 mL/min (ref >60)
Glucose, Bld: 88 mg/dL (ref 70–99)
Potassium: 3.4 mmol/L — ABNORMAL LOW (ref 3.5–5.1)
Sodium: 137 mmol/L (ref 135–145)
Total Bilirubin: 0.3 mg/dL (ref 0.3–1.2)
Total Protein: 7.7 g/dL (ref 6.5–8.1)

## 2020-05-21 LAB — POC URINE PREG, ED: Preg Test, Ur: NEGATIVE

## 2020-05-21 MED ORDER — DIPHENHYDRAMINE HCL 50 MG/ML IJ SOLN
25.0000 mg | Freq: Once | INTRAMUSCULAR | Status: AC
  Administered 2020-05-21: 25 mg via INTRAVENOUS
  Filled 2020-05-21: qty 1

## 2020-05-21 MED ORDER — PROCHLORPERAZINE EDISYLATE 10 MG/2ML IJ SOLN
10.0000 mg | Freq: Once | INTRAMUSCULAR | Status: AC
  Administered 2020-05-21: 10 mg via INTRAVENOUS
  Filled 2020-05-21: qty 2

## 2020-05-21 NOTE — ED Provider Notes (Signed)
[facility] EMERGENCY DEPARTMENT Provider Note   CSN: [id number] Arrival date & time: 05/21/20  1352     History Chief Complaint  Patient presents with  . Migraine    Summer [name] is a 44 y.o. female.  HPI 44 year old female with a history of GERD, asthma, anxiety presents to the ER with migraine headaches which were unrelieved by her Imitrex.  Patient was seen by her PCP via telemed earlier today and was told to come to the ER for further evaluation.  Patient states that she has had a 10/10 headache starting Sunday.  She has been taking her Imitrex as directed, but has received very little relief.  She has some occasional nausea, but no vision changes or vomiting.  She notes some tingling to her hands as well but this is consistent with her migraines.  She states that this is the worst headache of her life.  She has been having trouble going to work over the last few days because of her symptoms.  She denies any neck stiffness, fevers or chills.  No personal history of strokes.    Past Medical History:  Diagnosis Date  . Anxiety   . Asthma   . GERD (gastroesophageal reflux disease)     Patient Active Problem List   Diagnosis Date Noted  . Mood change 08/19/2017  . Migraine with aura and without status migrainosus, not intractable 08/19/2017  . Strain of back 07/07/2017  . Hypothyroidism 07/07/2017  . GAD (generalized anxiety disorder) 01/17/2016  . Gastroesophageal reflux disease without esophagitis 01/17/2016  . Morbid obesity (HCC) 01/17/2016    Past Surgical History:  Procedure Laterality Date  . CHOLECYSTECTOMY N/A 02/21/2014   Procedure: LAPAROSCOPIC CHOLECYSTECTOMY;  Surgeon: Dalia Heading, MD;  Location: [facility];  Service: General;  Laterality: N/A;  . GALLBLADDER SURGERY    . TUBAL LIGATION    . TYMPANOSTOMY TUBE PLACEMENT Bilateral      OB History   No obstetric history on file.     Family History  Problem Relation Age of Onset  . Cancer Mother         colon  . Hypertension Mother   . Hypothyroidism Mother   . Heart disease Mother   . Heart disease Father   . Breast cancer Cousin     Social History   Tobacco Use  . Smoking status: Former Smoker    Packs/day: 0.50    Years: 3.00    Pack years: 1.50    Types: Cigarettes    Quit date: 02/20/1998    Years since quitting: 22.2  . Smokeless tobacco: Never Used  Vaping Use  . Vaping Use: Never used  Substance Use Topics  . Alcohol use: No  . Drug use: No    Home Medications Prior to Admission medications   Medication Sig Start Date End Date Taking? Authorizing Provider  ALPRAZolam Prudy Feeler) 0.5 MG tablet Take 1 tablet (0.5 mg total) by mouth 2 (two) times daily as needed for anxiety. 08/22/19  Yes Remus Loffler, PA-C  buPROPion (WELLBUTRIN XL) 300 MG 24 hr tablet Take 1 tablet (300 mg total) by mouth daily. 01/17/19  Yes Remus Loffler, PA-C  cyclobenzaprine (FLEXERIL) 10 MG tablet TAKE 1 TABLET BY MOUTH 3 TIMES DAILY AS NEEDED FOR MUSCLE SPASMS. Patient taking differently: Take 10 mg by mouth 3 (three) times daily as needed for muscle spasms.  11/28/19  Yes Remus Loffler, PA-C  fluticasone (FLONASE) 50 MCG/ACT nasal spray Place 2 sprays  into both nostrils daily. 11/14/19  Yes Martin, Mary-Margaret, FNP  ibuprofen (ADVIL) 800 MG tablet Take 1 tablet (800 mg total) by mouth 3 (three) times daily. 08/22/19  Yes Remus Loffler, PA-C  levothyroxine (SYNTHROID) 75 MCG tablet Take 1 tablet (75 mcg total) by mouth daily. 05/08/19  Yes Remus Loffler, PA-C  loratadine (CLARITIN) 10 MG tablet Take 1 tablet (10 mg total) by mouth daily. 11/14/19  Yes Martin, Mary-Margaret, FNP  montelukast (SINGULAIR) 10 MG tablet Take 1 tablet (10 mg total) by mouth at bedtime. 07/20/19  Yes Remus Loffler, PA-C  omeprazole (PRILOSEC) 20 MG capsule Take 1 capsule (20 mg total) by mouth daily. 01/17/19  Yes Remus Loffler, PA-C  SUMAtriptan (IMITREX) 50 MG tablet Take 1 tablet (50 mg total) by mouth every 2 (two)  hours as needed for migraine. May repeat in 2 hours if headache persists or recurs. 01/17/19  Yes Remus Loffler, PA-C  topiramate (TOPAMAX) 50 MG tablet Take 1 tablet (50 mg total) by mouth 2 (two) times daily. 08/22/19  Yes Remus Loffler, PA-C    Allergies    Tramadol  Review of Systems   Review of Systems  Constitutional: Negative for chills and fever.  HENT: Negative for ear pain and sore throat.   Eyes: Negative for pain and visual disturbance.  Respiratory: Negative for cough and shortness of breath.   Cardiovascular: Negative for chest pain and palpitations.  Gastrointestinal: Negative for abdominal pain and vomiting.  Genitourinary: Negative for dysuria and hematuria.  Musculoskeletal: Negative for arthralgias and back pain.  Skin: Negative for color change and rash.  Neurological: Positive for dizziness and headaches. Negative for seizures, syncope, weakness and numbness.  Psychiatric/Behavioral: Negative for confusion.  All other systems reviewed and are negative.   Physical Exam Updated Vital Signs BP 129/74 (BP Location: Right Arm)   Pulse 93   Temp 98.5 F (36.9 C) (Oral)   Resp 16   SpO2 99%   Physical Exam Vitals and nursing note reviewed.  Constitutional:      General: She is not in acute distress.    Appearance: She is well-developed.  HENT:     Head: Normocephalic and atraumatic.  Eyes:     Conjunctiva/sclera: Conjunctivae normal.  Cardiovascular:     Rate and Rhythm: Normal rate and regular rhythm.     Heart sounds: No murmur heard.   Pulmonary:     Effort: Pulmonary effort is normal. No respiratory distress.     Breath sounds: Normal breath sounds.  Abdominal:     Palpations: Abdomen is soft.     Tenderness: There is no abdominal tenderness.  Musculoskeletal:     Cervical back: Neck supple.  Skin:    General: Skin is warm and dry.  Neurological:     Mental Status: She is alert.     Comments: Mental Status:  Alert, thought content  appropriate, able to give a coherent history. Speech fluent without evidence of aphasia. Able to follow 2 step commands without difficulty.  Cranial Nerves:  II: Peripheral visual fields grossly normal, pupils equal, round, reactive to light III,IV, VI: ptosis not present, extra-ocular motions intact bilaterally  V,VII: smile symmetric, facial light touch sensation equal VIII: hearing grossly normal to voice  X: uvula elevates symmetrically  XI: bilateral shoulder shrug symmetric and strong XII: midline tongue extension without fassiculations Motor:  Normal tone. 5/5 strength of BUE and BLE major muscle groups including strong and equal grip strength and dorsiflexion/plantar  flexion Sensory: light touch normal in all extremities. Cerebellar: normal finger-to-nose with bilateral upper extremities, Romberg sign absent Gait: not accessed       ED Results / Procedures / Treatments   Labs (all labs ordered are listed, but only abnormal results are displayed) Labs Reviewed  COMPREHENSIVE METABOLIC PANEL - Abnormal; Notable for the following components:      Result Value   Potassium 3.4 (*)    Calcium 8.6 (*)    All other components within normal limits  CBC WITH DIFFERENTIAL/PLATELET  POC URINE PREG, ED    EKG None  Radiology CT Head Wo Contrast  Result Date: 05/21/2020 CLINICAL DATA:  Headache, acute, normal neuro exam. Additional provided: Patient sent by PCP for migraine headache which began on Sunday. EXAM: CT HEAD WITHOUT CONTRAST TECHNIQUE: Contiguous axial images were obtained from the base of the skull through the vertex without intravenous contrast. COMPARISON:  No pertinent prior studies available for comparison. FINDINGS: Brain: Cerebral volume is normal. There is no acute intracranial hemorrhage. No demarcated cortical infarct. No extra-axial fluid collection. No evidence of intracranial mass. No midline shift. Vascular: No hyperdense vessel. Skull: Normal. Negative for  fracture or focal lesion. Sinuses/Orbits: Visualized orbits show no acute finding. Minimal ethmoid sinus mucosal thickening. No significant mastoid effusion. IMPRESSION: Unremarkable non-contrast CT appearance of the brain. No evidence of acute intracranial abnormality. Minimal ethmoid sinus mucosal thickening at the imaged levels. Electronically Signed   By: Kyle  Golden DO   On: 05/21/2020 17:12    Procedures Procedures (including critical care time)  Medications Ordered in ED Medications  prochlorperazine (COMPAZINE) injection 10 mg (10 mg Intravenous Given 05/21/20 1627)  diphenhydrAMINE (BENADRYL) injection 25 mg (25 mg Intravenous Given 05/21/20 1627)    ED Course  I have reviewed the triage vital signs and the nursing notes.  Pertinent labs & imaging results that were available during my care of the patient were reviewed by me and considered in my medical decision making (see chart for details).    MDM Rules/Calculators/A&P                          44  year old female here with severe migraine stating "this is the worst headache of my life".  Vitals overall reassuring, no signs of hypertensive urgency/emergency.  Neuro exam without any acute abnormalities.  Given she is endorsing severe headache, ordered CT scan which was without acute abnormalities.  I reviewed her CBC and BMP which were without any significant abnormalities.  Pregnancy negative.  Patient received a migraine cocktail here in the ED, and on reevaluation notes mild to moderate improvement of her headaches.  She is comfortable to go home with strict return precautions.  My suspicion for stroke is low at this time.  I encouraged her to follow-up with her PCP for possible adjustment of her migraine medications.  All the patient's questions have been answered to her satisfaction, she voices understanding and is agreeable to this plan.  At this stage in the ED course, the patient has been medically screened and is stable for  discharge. Final Clinical Impression(s) / ED Diagnoses Final diagnoses:  Migraine without status migrainosus, not intractable, unspecified migraine type    Rx / DC Orders ED Discharge Orders    None       05/21/20 1744    05/23/20, MD 05/21/20 2257

## 2020-05-21 NOTE — Progress Notes (Signed)
   Virtual Visit via telephone Note Due to COVID-19 pandemic this visit was conducted virtually. This visit type was conducted due to national recommendations for restrictions regarding the COVID-19 Pandemic (e.g. social distancing, sheltering in place) in an effort to limit this patient's exposure and mitigate transmission in our community. All issues noted in this document were discussed and addressed.  A physical exam was not performed with this format.  I connected with Summer [name] on 05/21/20 at 12:25 by telephone and verified that I am speaking with the correct person using two identifiers. Summer [name] is currently located at home and her husband is currently with her during visit. The provider, Mary-Margaret Daphine Deutscher, FNP is located in their office at time of visit.  I discussed the limitations, risks, security and privacy concerns of performing an evaluation and management service by telephone and the availability of in person appointments. I also discussed with the patient that there may be a patient responsible charge related to this service. The patient expressed understanding and agreed to proceed.   History and Present Illness:   Chief Complaint: Migraine   HPI Patient calls in c/o of migraine that will not go away. She has taken her migraine meds and they have not helped. She took 4 imitrex yesterday and has taken 2 already today.Started on late Sunday evening. Has some nausea. Denies any blurred vision. This is the worse headache she has ever had. Rates pain 5-6/10 currently, but will go up to 10/10.  Review of Systems  Constitutional: Negative for diaphoresis and weight loss.  Eyes: Negative for blurred vision, double vision and pain.  Respiratory: Negative for shortness of breath.   Cardiovascular: Negative for chest pain, palpitations, orthopnea and leg swelling.  Gastrointestinal: Negative for abdominal pain.  Skin: Negative for rash.  Neurological: Negative for  dizziness, sensory change, loss of consciousness, weakness and headaches.  Endo/Heme/Allergies: Negative for polydipsia. Does not bruise/bleed easily.  Psychiatric/Behavioral: Negative for memory loss. The patient does not have insomnia.   All other systems reviewed and are negative.    Observations/Objective: Alert and oriented Acute distress  Assessment and Plan: migriane- intractable To ER - may need scan They can also give her a toradol injection which may help Avoid caffeine  Follow Up Instructions: prn    I discussed the assessment and treatment plan with the patient. The patient was provided an opportunity to ask questions and all were answered. The patient agreed with the plan and demonstrated an understanding of the instructions.   The patient was advised to call back or seek an in-person evaluation if the symptoms worsen or if the condition fails to improve as anticipated.  The above assessment and management plan was discussed with the patient. The patient verbalized understanding of and has agreed to the management plan. Patient is aware to call the clinic if symptoms persist or worsen. Patient is aware when to return to the clinic for a follow-up visit. Patient educated on when it is appropriate to go to the emergency department.   Time call ended:  12:40  I provided 15 minutes of non-face-to-face time during this encounter.    Mary-Margaret Daphine Deutscher, FNP

## 2020-05-21 NOTE — Discharge Instructions (Signed)
Your work-up today was overall reassuring.  Please return to the ER if your symptoms worsen.  Please make sure to follow-up with your primary care doctor for possible adjustment of your migraine medications.

## 2020-05-21 NOTE — ED Triage Notes (Signed)
Pt sent by PCP for migraine headache that started on Sunday. Pt reports she has taken her medication for her migraines with no relief.

## 2020-06-14 ENCOUNTER — Other Ambulatory Visit: Payer: Self-pay | Admitting: *Deleted

## 2020-06-14 DIAGNOSIS — E039 Hypothyroidism, unspecified: Secondary | ICD-10-CM

## 2020-06-14 NOTE — Telephone Encounter (Signed)
Gottschalk. NTBS last TSH 05/05/19

## 2020-06-14 NOTE — Telephone Encounter (Signed)
lmtcb to schedule follow up appt 

## 2020-06-24 DIAGNOSIS — Z23 Encounter for immunization: Secondary | ICD-10-CM | POA: Diagnosis not present

## 2020-07-22 DIAGNOSIS — Z23 Encounter for immunization: Secondary | ICD-10-CM | POA: Diagnosis not present

## 2020-07-29 MED FILL — IBUPROFEN 800 MG TAB: 800 | 90 days supply | Qty: 270 | Fill #3

## 2020-07-29 MED FILL — TOPIRAMATE 50 MG TABLET: 50 | 90 days supply | Qty: 180 | Fill #3

## 2020-09-09 ENCOUNTER — Encounter: Payer: Self-pay | Admitting: Family

## 2020-09-09 ENCOUNTER — Ambulatory Visit (INDEPENDENT_AMBULATORY_CARE_PROVIDER_SITE_OTHER): Payer: 59 | Admitting: Family

## 2020-09-09 DIAGNOSIS — J069 Acute upper respiratory infection, unspecified: Secondary | ICD-10-CM

## 2020-09-09 DIAGNOSIS — J302 Other seasonal allergic rhinitis: Secondary | ICD-10-CM | POA: Diagnosis not present

## 2020-09-09 DIAGNOSIS — J029 Acute pharyngitis, unspecified: Secondary | ICD-10-CM

## 2020-09-09 MED ORDER — FLUTICASONE PROPIONATE 50 MCG/ACT NA SUSP: 2.0000 | Freq: Every day | NASAL | 6 refills | Status: DC

## 2020-09-09 MED ORDER — PREDNISONE 10 MG (21) PO TBPK: ORAL_TABLET | ORAL | 0 refills | Status: DC

## 2020-09-09 MED ORDER — MONTELUKAST SODIUM 10 MG PO TABS: 10.0000 mg | ORAL_TABLET | Freq: Every day | ORAL | 3 refills | Status: DC

## 2020-09-09 NOTE — Progress Notes (Signed)
Virtual Visit via telephone Note Due to COVID-19 pandemic this visit was conducted virtually. This visit type was conducted due to national recommendations for restrictions regarding the COVID-19 Pandemic (e.g. social distancing, sheltering in place) in an effort to limit this patient's exposure and mitigate transmission in our community. All issues noted in this document were discussed and addressed.  A physical exam was not performed with this format.  I connected with Summer [name] on 09/09/20 at 10:39 AM by telephone and verified that I am speaking with the correct person using two identifiers. Summer [name] is currently located at home and husband is currently with her during visit. The provider, Jannifer Rodney, FNP is located in their office at time of visit.  I discussed the limitations, risks, security and privacy concerns of performing an evaluation and management service by telephone and the availability of in person appointments. I also discussed with the patient that there may be a patient responsible charge related to this service. The patient expressed understanding and agreed to proceed.   History and Present Illness:  Sinusitis This is a new problem. The current episode started in the past 7 days (Friday). The problem has been gradually worsening since onset. There has been no fever. Her pain is at a severity of 4/10. The pain is moderate. Associated symptoms include congestion, coughing, ear pain, headaches, a hoarse voice, sinus pressure, sneezing, a sore throat and swollen glands. Pertinent negatives include no chills or shortness of breath. Past treatments include acetaminophen. The treatment provided mild relief.      Review of Systems  Constitutional: Negative for chills.  HENT: Positive for congestion, ear pain, hoarse voice, sinus pressure, sneezing and sore throat.   Respiratory: Positive for cough. Negative for shortness of breath.   Neurological: Positive for  headaches.     Observations/Objective: No SOB or distress noted, intermittent dry cough  Assessment and Plan: Summer [name] comes in today with chief complaint of No chief complaint on file.   Diagnosis and orders addressed:  1. Sore throat - Rapid Strep Screen ([facility] ONLY)  2. Viral URI - Take meds as prescribed - Use a cool mist humidifier  -Use saline nose sprays frequently -Force fluids -For any cough or congestion  Use plain Mucinex- regular strength or max strength is fine -For fever or aces or pains- take tylenol or ibuprofen. -Throat lozenges if help -New toothbrush in 3 days Strep and COVID test pending Start flonase and Singulair Prednisone dose pack  Work note given If symptoms worsen or do not improve, call office  - fluticasone (FLONASE) 50 MCG/ACT nasal spray; Place 2 sprays into both nostrils daily.  Dispense: 16 g; Refill: 6 - predniSONE (STERAPRED UNI-PAK 21 TAB) 10 MG (21) TBPK tablet; Use as directed  Dispense: 21 tablet; Refill: 0 - Novel Coronavirus, NAA ([facility])  3. Seasonal allergic rhinitis, unspecified trigger  - montelukast (SINGULAIR) 10 MG tablet; Take 1 tablet (10 mg total) by mouth at bedtime.  Dispense: 90 tablet; Refill: 3     I discussed the assessment and treatment plan with the patient. The patient was provided an opportunity to ask questions and all were answered. The patient agreed with the plan and demonstrated an understanding of the instructions.   The patient was advised to call back or seek an in-person evaluation if the symptoms worsen or if the condition fails to improve as anticipated.  The above assessment and management plan was discussed with the patient. The  patient verbalized understanding of and has agreed to the management plan. Patient is aware to call the clinic if symptoms persist or worsen. Patient is aware when to return to the clinic for a follow-up visit. Patient educated on when it is appropriate to  go to the emergency department.   Time call ended:  10:49 AM   I provided 11 minutes of non-face-to-face time during this encounter.    Jannifer Rodney, FNP

## 2020-09-10 ENCOUNTER — Telehealth: Payer: Self-pay

## 2020-09-10 LAB — SARS-COV-2, NAA 2 DAY TAT

## 2020-09-10 LAB — NOVEL CORONAVIRUS, NAA: SARS-CoV-2, NAA: NOT DETECTED

## 2020-09-10 NOTE — Telephone Encounter (Signed)
Patient aware and verbalized understanding. °

## 2020-09-12 ENCOUNTER — Other Ambulatory Visit: Payer: Self-pay | Admitting: Family

## 2020-09-12 MED ORDER — AMOXICILLIN 500 MG PO CAPS: 500.0000 mg | ORAL_CAPSULE | Freq: Two times a day (BID) | ORAL | 0 refills | Status: AC

## 2020-09-18 ENCOUNTER — Telehealth: Payer: Self-pay

## 2020-09-18 NOTE — Telephone Encounter (Signed)
Ok to provide

## 2020-09-18 NOTE — Telephone Encounter (Signed)
Patient will finish antibiotic on 11/14 and finished prednisone on 11/5.  Patient to wait 2 weeks after getting steroid injection.  Letter completed and faxed to [phone]

## 2020-09-18 NOTE — Telephone Encounter (Signed)
Pt called stating that she is still on antibiotics and needs to wait to have flu shot but says her work is needing a note from pts PCP regarding this issue and confirming that pt should wait to get flu shot until she is done with antibiotics.   Please fax to [phone]

## 2020-09-23 LAB — CULTURE, GROUP A STREP

## 2020-09-23 LAB — RAPID STREP SCREEN (MED CTR MEBANE ONLY): Strep Gp A Ag, IA W/Reflex: NEGATIVE

## 2020-12-11 ENCOUNTER — Telehealth (INDEPENDENT_AMBULATORY_CARE_PROVIDER_SITE_OTHER): Payer: 59 | Admitting: Family Medicine

## 2020-12-11 ENCOUNTER — Other Ambulatory Visit: Payer: Self-pay | Admitting: Family Medicine

## 2020-12-11 DIAGNOSIS — G43109 Migraine with aura, not intractable, without status migrainosus: Secondary | ICD-10-CM

## 2020-12-11 MED ORDER — AZITHROMYCIN 250 MG PO TABS: ORAL_TABLET | ORAL | 0 refills | Status: DC

## 2020-12-11 MED ORDER — IBUPROFEN 800 MG PO TABS: 800.0000 mg | ORAL_TABLET | Freq: Three times a day (TID) | ORAL | 3 refills | Status: DC

## 2020-12-11 MED ORDER — METHYLPREDNISOLONE 8 MG PO TABS
ORAL_TABLET | ORAL | 0 refills | Status: DC
Start: 2020-12-11 — End: 2021-03-26

## 2020-12-11 NOTE — Progress Notes (Signed)
Subjective:    Patient ID: Summer [name], female    DOB: 04-27-1976, 45 y.o.   MRN: [id number]   HPI: Summer [name] is a 45 y.o. female presenting for onset ST, scratchy starting yesterday. Denies fever. Congested nose from chronic allergy, unchanged from baseline. No rhinorrhea, no otalgia. A little scratchy cough. Nonproductive. No dyspnea. Denies loss of taste and smell.    Depression screen [facility] 2/9 01/17/2019 08/19/2017 07/07/2017 02/02/2017 12/16/2016  Decreased Interest 1 1 0 1 0  Down, Depressed, Hopeless 1 0 0 0 0  PHQ - 2 Score 2 1 0 1 0  Altered sleeping 1 - - - -  Tired, decreased energy 1 - - - -  Change in appetite 1 - - - -  Feeling bad or failure about yourself  1 - - - -  Trouble concentrating 2 - - - -  Moving slowly or fidgety/restless 0 - - - -  Suicidal thoughts 0 - - - -  PHQ-9 Score 8 - - - -     Relevant past medical, surgical, family and social history reviewed and updated as indicated.  Interim medical history since our last visit reviewed. Allergies and medications reviewed and updated.  ROS:  Review of Systems  Constitutional: Negative for activity change, appetite change, chills and fever.  HENT: Positive for congestion, postnasal drip and sore throat. Negative for ear discharge, ear pain, hearing loss, nosebleeds, rhinorrhea, sinus pressure, sneezing and trouble swallowing.   Respiratory: Positive for cough. Negative for chest tightness and shortness of breath.   Cardiovascular: Negative for chest pain and palpitations.  Skin: Negative for rash.     Social History   Tobacco Use  Smoking Status Former Smoker  . Packs/day: 0.50  . Years: 3.00  . Pack years: 1.50  . Types: Cigarettes  . Quit date: 02/20/1998  . Years since quitting: 22.8  Smokeless Tobacco Never Used       Objective:     Wt Readings from Last 3 Encounters:  01/17/19 249 lb 3.2 oz (113 kg)  08/19/17 242 lb (109.8 kg)  07/07/17 246 lb (111.6 kg)     Exam deferred.  Pt. Harboring due to COVID 19. Phone visit performed.   Assessment & Plan:   1. Migraine with aura and without status migrainosus, not intractable     Meds ordered this encounter  Medications  . azithromycin (ZITHROMAX Z-PAK) 250 MG tablet    Sig: Take two right away Then one a day for the next 4 days.    Dispense:  6 each    Refill:  0  . methylPREDNISolone (MEDROL) 8 MG tablet    Sig: 4 daily for 2 days, then 3 daily for 2 days, then 2 daily for 2 days, then 1 daily for 2 days    Dispense:  20 tablet    Refill:  0  . ibuprofen (ADVIL) 800 MG tablet    Sig: Take 1 tablet (800 mg total) by mouth 3 (three) times daily.    Dispense:  270 tablet    Refill:  3    No orders of the defined types were placed in this encounter.     Diagnoses and all orders for this visit:  Migraine with aura and without status migrainosus, not intractable -     ibuprofen (ADVIL) 800 MG tablet; Take 1 tablet (800 mg total) by mouth 3 (three) times daily.  Other orders -     azithromycin (ZITHROMAX Z-PAK)  250 MG tablet; Take two right away Then one a day for the next 4 days. -     methylPREDNISolone (MEDROL) 8 MG tablet; 4 daily for 2 days, then 3 daily for 2 days, then 2 daily for 2 days, then 1 daily for 2 days    Virtual Visit via telephone Note  I discussed the limitations, risks, security and privacy concerns of performing an evaluation and management service by telephone and the availability of in person appointments. The patient was identified with two identifiers. Pt.expressed understanding and agreed to proceed. Pt. Is at home. Dr. Darlyn Read is in his office.  Follow Up Instructions:   I discussed the assessment and treatment plan with the patient. The patient was provided an opportunity to ask questions and all were answered. The patient agreed with the plan and demonstrated an understanding of the instructions.   The patient was advised to call back or seek an in-person evaluation if the  symptoms worsen or if the condition fails to improve as anticipated.   Total minutes including chart review and phone contact time: 14   Follow up plan: No follow-ups on file.  Mechele Claude, MD [facility]

## 2020-12-12 MED FILL — IBUPROFEN 800 MG TAB: 800 | 90 days supply | Qty: 270 | Fill #0

## 2020-12-27 ENCOUNTER — Telehealth: Payer: 59 | Admitting: Emergency Medicine

## 2020-12-27 DIAGNOSIS — B001 Herpesviral vesicular dermatitis: Secondary | ICD-10-CM

## 2020-12-27 MED ORDER — VALACYCLOVIR HCL 1 G PO TABS: 2000.0000 mg | ORAL_TABLET | Freq: Two times a day (BID) | ORAL | 0 refills | Status: DC

## 2020-12-27 MED FILL — valACYclovir HCL 1 GM TABS: 1 | 1 days supply | Qty: 4 | Fill #0

## 2020-12-27 NOTE — Progress Notes (Signed)
We are sorry that you are not feeling well.  Here is how we plan to help!  Based on what you have shared with me it does look like you have a viral infection.    Most cold sores or fever blisters are small fluid filled blisters around the mouth caused by herpes simplex virus.  The most common strain of the virus causing cold sores is herpes simplex virus 1.  It can be spread by skin contact, sharing eating utensils, or even sharing towels.  Cold sores are contagious to other people until dry. (Approximately 5-7 days).  Wash your hands. You can spread the virus to your eyes through handling your contact lenses after touching the lesions.  Most people experience pain at the sight or tingling sensations in their lips that may begin before the ulcers erupt.  Herpes simplex is treatable but not curable.  It may lie dormant for a long time and then reappear due to stress or prolonged sun exposure.  Many patients have success in treating their cold sores with an over the counter topical called Abreva.  You may apply the cream up to 5 times daily (maximum 10 days) until healing occurs.  If you would like to use an oral antiviral medication to speed the healing of your cold sore, I have sent a prescription to your local pharmacy Valacyclovir 2 gm take one by mouth twice a day for 1 day    HOME CARE:  Wash your hands frequently. Do not pick at or rub the sore. Don't open the blisters. Avoid kissing other people during this time. Avoid sharing drinking glasses, eating utensils, or razors. Do not handle contact lenses unless you have thoroughly washed your hands with soap and warm water! Avoid oral sex during this time.  Herpes from sores on your mouth can spread to your partner's genital area. Avoid contact with anyone who has eczema or a weakened immune system. Cold sores are often triggered by exposure to intense sunlight, use a lip balm containing a sunscreen (SPF 30 or higher).  GET HELP RIGHT AWAY  IF:  Blisters look infected. Blisters occur near or in the eye. Symptoms last longer than 10 days. Your symptoms become worse.  MAKE SURE YOU:  Understand these instructions. Will watch your condition. Will get help right away if you are not doing well or get worse.    Your e-visit answers were reviewed by a board certified advanced clinical practitioner to complete your personal care plan.  Depending upon the condition, your plan could have  Included both over the counter or prescription medications.    Please review your pharmacy choice.  Be sure that the pharmacy you have chosen is open so that you can pick up your prescription now.  If there is a problem you can message your provider in MyChart to have the prescription routed to another pharmacy.    Your safety is important to us.  If you have drug allergies check our prescription carefully.  For the next 24 hours you can use MyChart to ask questions about today's visit, request a non-urgent call back, or ask for a work or school excuse from your e-visit provider.  You will get an email in the next two days asking about your experience.  I hope that your e-visit has been valuable and will speed your recovery.  Approximately 5 minutes was used in reviewing the patient's chart, questionnaire, prescribing medications, and documentation.    the patient's chart, questionnaire, prescribing medications, and documentation.

## 2021-01-13 ENCOUNTER — Other Ambulatory Visit: Payer: Self-pay | Admitting: Family Medicine

## 2021-01-13 DIAGNOSIS — G43109 Migraine with aura, not intractable, without status migrainosus: Secondary | ICD-10-CM

## 2021-01-14 ENCOUNTER — Other Ambulatory Visit: Payer: Self-pay | Admitting: Family Medicine

## 2021-01-14 MED FILL — TOPIRAMATE 50 MG TABLET: 50 | 90 days supply | Qty: 180 | Fill #0

## 2021-01-15 IMAGING — CT CT HEAD W/O CM
3 series · 15 of 47 positions shown, 18 images · non-contrast
Comparison: No pertinent prior studies available for comparison.

CLINICAL DATA: Headache, acute, normal neuro exam. Additional
provided: Patient sent by PCP for migraine headache which began on
[REDACTED].

EXAM:
CT HEAD WITHOUT CONTRAST
TECHNIQUE: Contiguous axial images were obtained from the base of the skull
through the vertex without intravenous contrast.

[Series 2: head w o · axial · 0.45mm/px · z∈[+48,+178]mm · 9 of 32 slices shown, 12 images]
[im 3/32  brain]
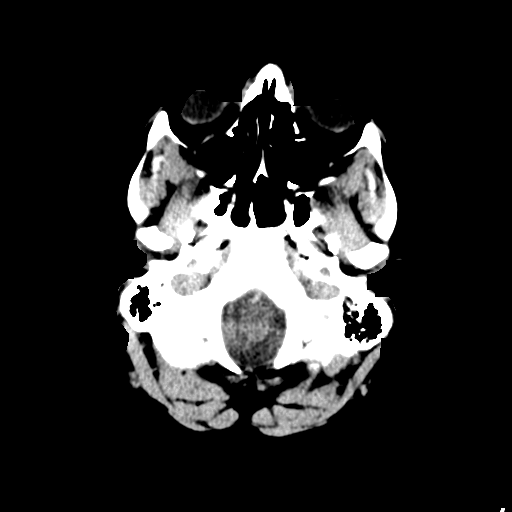
[im 3/32  bone]
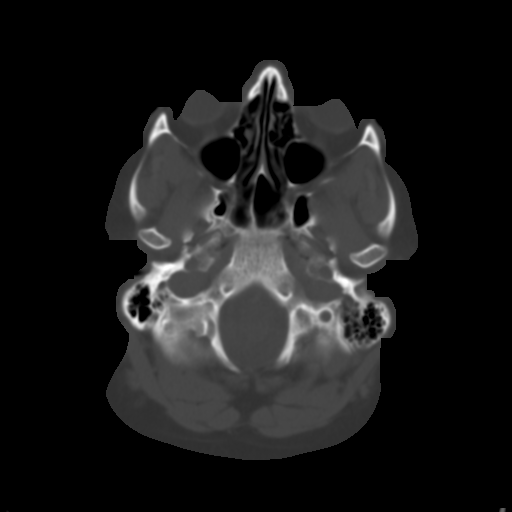
[im 6/32  brain]
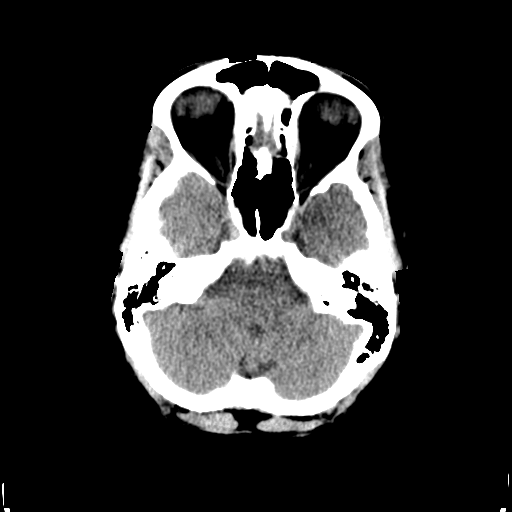
[im 9/32  brain]
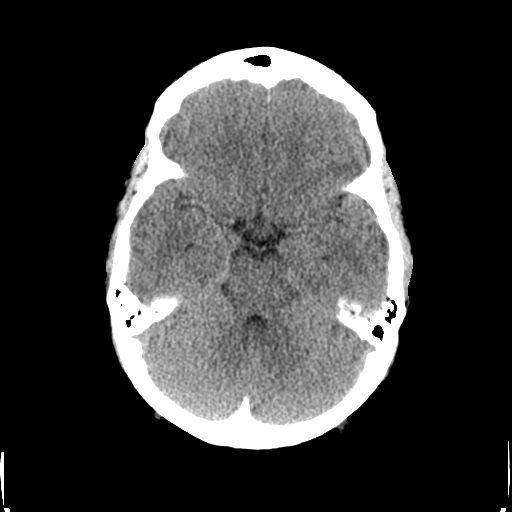
[im 12/32  brain]
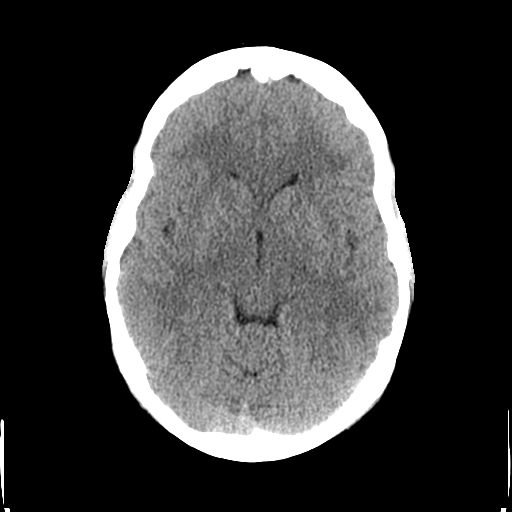
[im 17/32  brain]
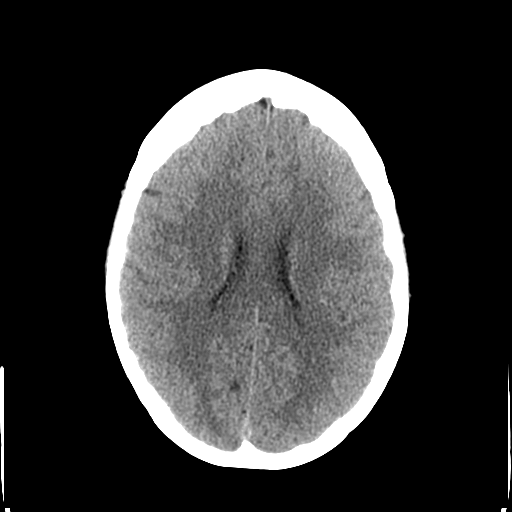
[im 17/32  bone]
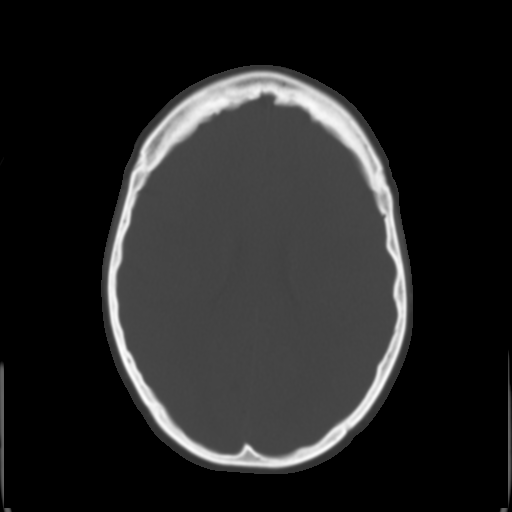
[im 20/32  brain]
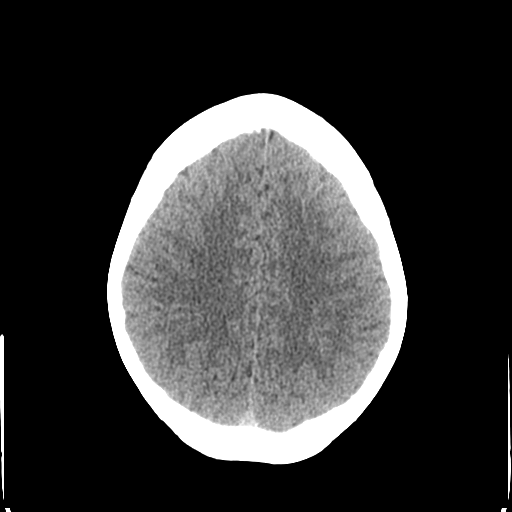
[im 23/32  brain]
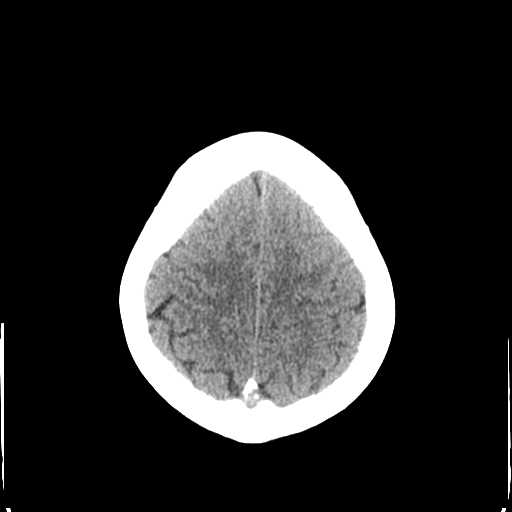
[im 26/32  brain]
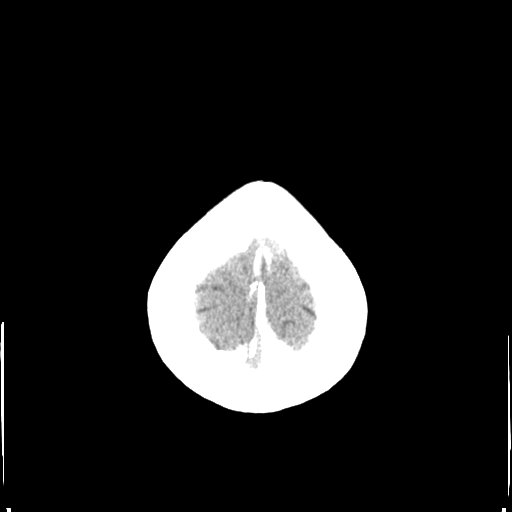
[im 29/32  brain]
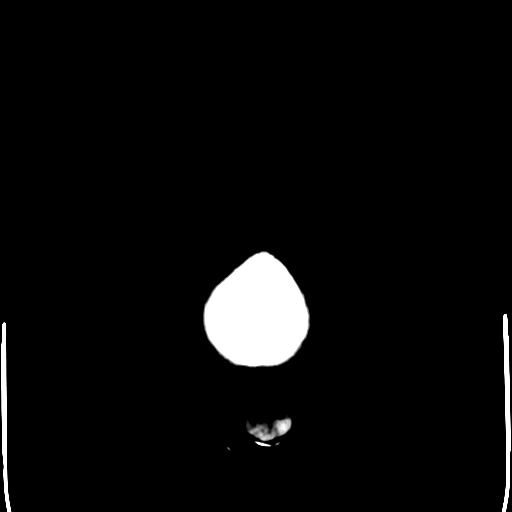
[im 29/32  bone]
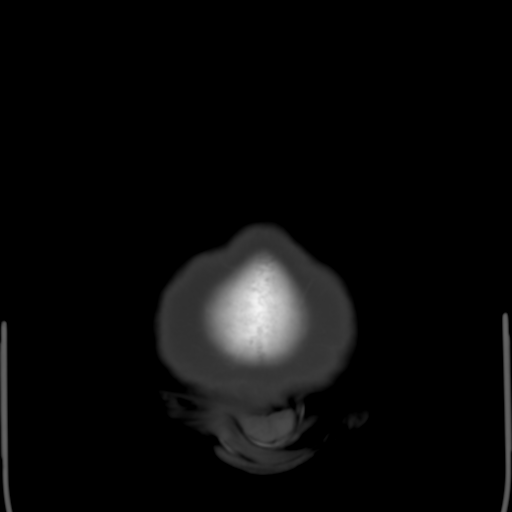

[Series 4: coronal soft · coronal · 0.33mm/px · 3 of 72 slices shown]
[im 24/72  brain]
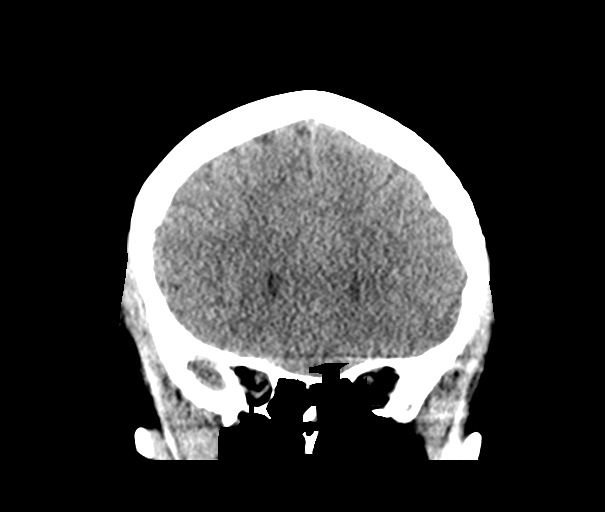
[im 32/72  brain]
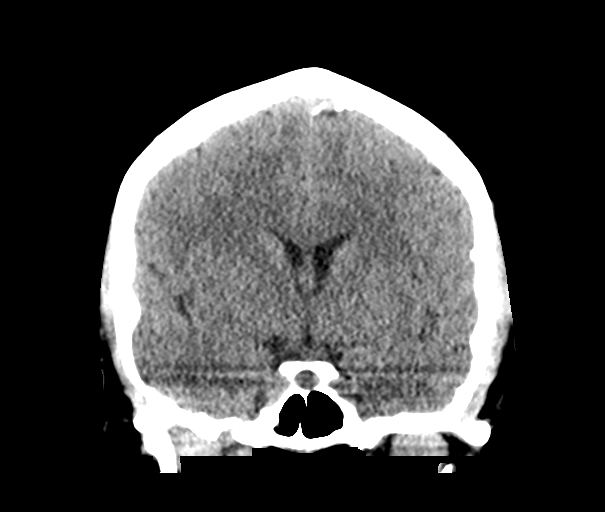
[im 40/72  brain]
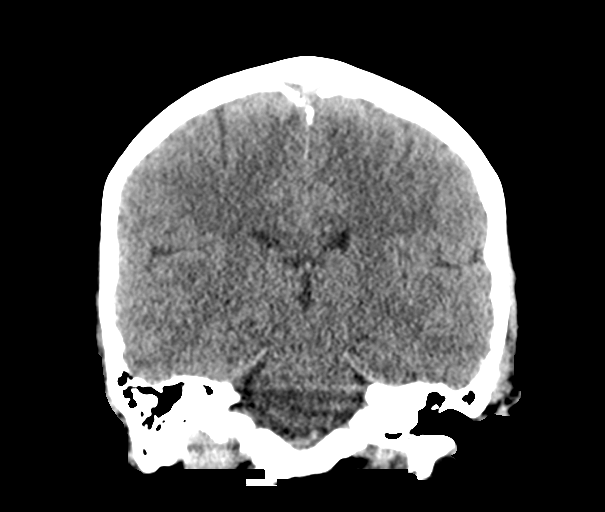

[Series 5: sagittal soft · sagittal · 0.33mm/px · 3 of 67 slices shown]
[im 23/67  brain]
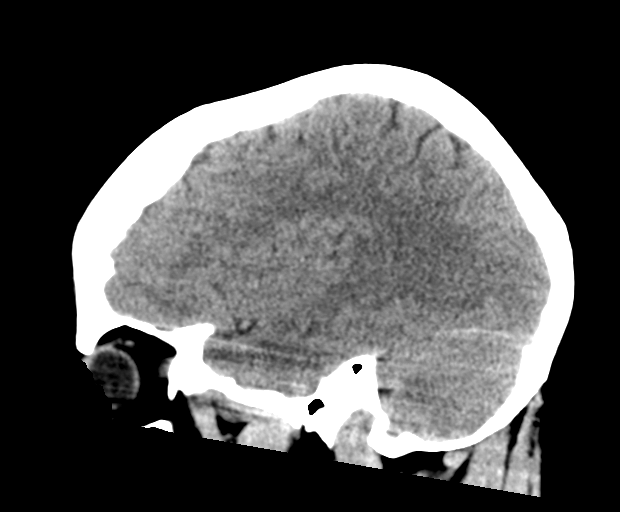
[im 34/67  brain]
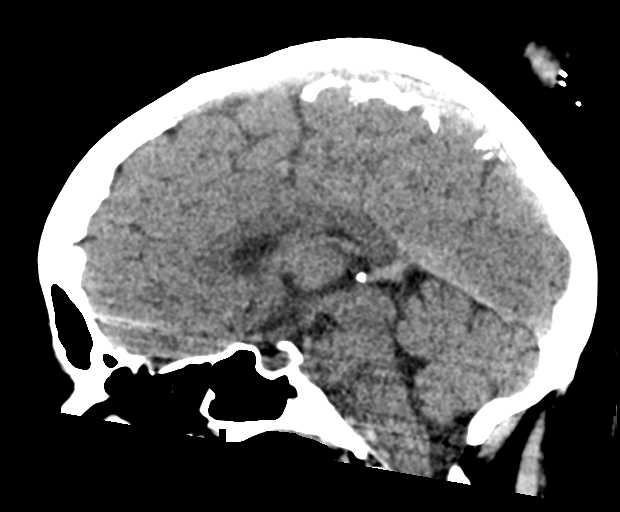
[im 45/67  brain]
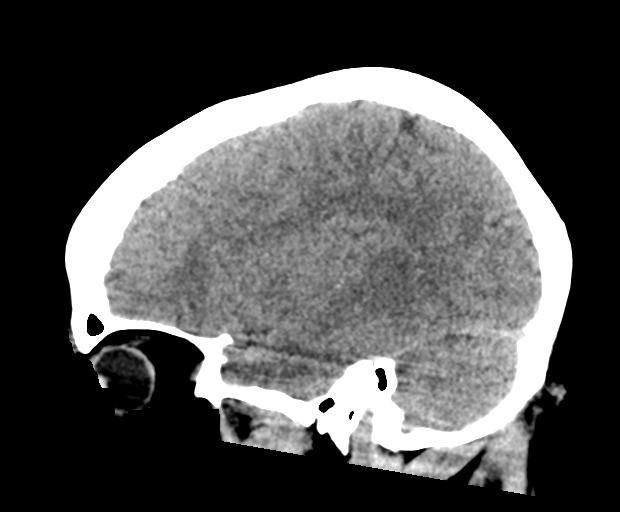

[15 of 47 positions shown; findings below may reference images not displayed]

FINDINGS: Brain:

Cerebral volume is normal.

There is no acute intracranial hemorrhage.

No demarcated cortical infarct.

No extra-axial fluid collection.

No evidence of intracranial mass.

No midline shift.

Vascular: No hyperdense vessel.

Skull: Normal. Negative for fracture or focal lesion.

Sinuses/Orbits: Visualized orbits show no acute finding. Minimal
ethmoid sinus mucosal thickening. No significant mastoid effusion.
IMPRESSION: Unremarkable non-contrast CT appearance of the brain. No evidence of
acute intracranial abnormality.

Minimal ethmoid sinus mucosal thickening at the imaged levels.

## 2021-02-03 ENCOUNTER — Encounter: Payer: Self-pay | Admitting: Nurse Practitioner

## 2021-02-03 ENCOUNTER — Ambulatory Visit (INDEPENDENT_AMBULATORY_CARE_PROVIDER_SITE_OTHER): Payer: 59 | Admitting: Nurse Practitioner

## 2021-02-03 ENCOUNTER — Other Ambulatory Visit: Payer: Self-pay

## 2021-02-03 VITALS — BP 99/60 | HR 75 | Temp 97.8°F | Ht 63.0 in | Wt 252.4 lb

## 2021-02-03 DIAGNOSIS — R609 Edema, unspecified: Secondary | ICD-10-CM | POA: Diagnosis not present

## 2021-02-03 MED ORDER — FUROSEMIDE 20 MG PO TABS: 20.0000 mg | ORAL_TABLET | Freq: Every day | ORAL | 3 refills | Status: DC

## 2021-02-03 NOTE — Patient Instructions (Signed)
Peripheral Edema  Peripheral edema is swelling that is caused by a buildup of fluid. Peripheral edema most often affects the lower legs, ankles, and feet. It can also develop in the arms, hands, and face. The area of the body that has peripheral edema will look swollen. It may also feel heavy or warm. Your clothes may start to feel tight. Pressing on the area may make a temporary dent in your skin. You may not be able to move your swollen arm or leg as much as usual. There are many causes of peripheral edema. It can happen because of a complication of other conditions such as congestive heart failure, kidney disease, or a problem with your blood circulation. It also can be a side effect of certain medicines or because of an infection. It often happens to women during pregnancy. Sometimes, the cause is not known. Follow these instructions at home: Managing pain, stiffness, and swelling  Raise (elevate) your legs while you are sitting or lying down.  Move around often to prevent stiffness and to lessen swelling.  Do not sit or stand for long periods of time.  Wear support stockings as told by your health care provider.   Medicines  Take over-the-counter and prescription medicines only as told by your health care provider.  Your health care provider may prescribe medicine to help your body get rid of excess water (diuretic). General instructions  Pay attention to any changes in your symptoms.  Follow instructions from your health care provider about limiting salt (sodium) in your diet. Sometimes, eating less salt may reduce swelling.  Moisturize skin daily to help prevent skin from cracking and draining.  Keep all follow-up visits as told by your health care provider. This is important. Contact a health care provider if you have:  A fever.  Edema that starts suddenly or is getting worse, especially if you are pregnant or have a medical condition.  Swelling in only one leg.  Increased  swelling, redness, or pain in one or both of your legs.  Drainage or sores at the area where you have edema. Get help right away if you:  Develop shortness of breath, especially when you are lying down.  Have pain in your chest or abdomen.  Feel weak.  Feel faint. Summary  Peripheral edema is swelling that is caused by a buildup of fluid. Peripheral edema most often affects the lower legs, ankles, and feet.  Move around often to prevent stiffness and to lessen swelling. Do not sit or stand for long periods of time.  Pay attention to any changes in your symptoms.  Contact a health care provider if you have edema that starts suddenly or is getting worse, especially if you are pregnant or have a medical condition.  Get help right away if you develop shortness of breath, especially when lying down. This information is not intended to replace advice given to you by your health care provider. Make sure you discuss any questions you have with your health care provider. Document Revised: 07/20/2018 Document Reviewed: 07/20/2018 Elsevier Patient Education  2021 [employer] Inc[employer]  

## 2021-02-03 NOTE — Progress Notes (Signed)
   Subjective:    Patient ID: [name], female    DOB: 13-Dec-1975, 45 y.o.   MRN: [id number]   Chief Complaint: Ankle Pain (Both and swollen )   HPI Patient comes in today c/o bil ankle swelling. She has also developed tingling in her hands. Happened yesterday and otherwise intermittently. Swelling is gone this morning.  Review of Systems  Constitutional: Negative for diaphoresis.  Eyes: Negative for pain.  Cardiovascular: Positive for leg swelling. Negative for chest pain and palpitations.  Gastrointestinal: Negative for abdominal pain.  Endocrine: Negative for polydipsia.  Skin: Negative for rash.  Neurological: Negative for dizziness, weakness and headaches.  Hematological: Does not bruise/bleed easily.  All other systems reviewed and are negative.      Objective:   Physical Exam Vitals and nursing note reviewed.  Constitutional:      General: She is not in acute distress.    Appearance: Normal appearance. She is well-developed.  Neck:     Vascular: No carotid bruit or JVD.  Cardiovascular:     Rate and Rhythm: Normal rate and regular rhythm.     Heart sounds: Normal heart sounds.  Pulmonary:     Effort: Pulmonary effort is normal. No respiratory distress.     Breath sounds: Normal breath sounds. No wheezing or rales.  Chest:     Chest wall: No tenderness.  Abdominal:     General: Bowel sounds are normal. There is no distension or abdominal bruit.     Palpations: Abdomen is soft. There is no hepatomegaly, splenomegaly, mass or pulsatile mass.     Tenderness: There is no abdominal tenderness.  Musculoskeletal:        General: Normal range of motion.     Cervical back: Normal range of motion and neck supple.  Lymphadenopathy:     Cervical: No cervical adenopathy.  Skin:    General: Skin is warm and dry.  Neurological:     Mental Status: She is alert and oriented to person, place, and time.     Deep Tendon Reflexes: Reflexes are normal and symmetric.   Psychiatric:        Behavior: Behavior normal.        Thought Content: Thought content normal.        Judgment: Judgment normal.    BP 99/60   Pulse 75   Temp 97.8 F (36.6 C) (Temporal)   Ht 5\' 3"  (1.6 m)   Wt 252 lb 6.4 oz (114.5 kg)   BMI 44.71 kg/m         Assessment & Plan:  Miyanna C Stiefel in today with chief complaint of Ankle Pain (Both and swollen )   1. Peripheral edema Elevate legs when sitting Compression socks when working Lasix prn - furosemide (LASIX) 20 MG tablet; Take 1 tablet (20 mg total) by mouth daily.  Dispense: 30 tablet; Refill: 3    The above assessment and management plan was discussed with the patient. The patient verbalized understanding of and has agreed to the management plan. Patient is aware to call the clinic if symptoms persist or worsen. Patient is aware when to return to the clinic for a follow-up visit. Patient educated on when it is appropriate to go to the emergency department.   Mary-Margaret , FNP

## 2021-02-19 ENCOUNTER — Other Ambulatory Visit (HOSPITAL_COMMUNITY): Payer: Self-pay

## 2021-03-26 ENCOUNTER — Other Ambulatory Visit (HOSPITAL_COMMUNITY): Payer: Self-pay

## 2021-03-26 ENCOUNTER — Encounter: Payer: Self-pay | Admitting: Family Medicine

## 2021-03-26 ENCOUNTER — Other Ambulatory Visit: Payer: Self-pay

## 2021-03-26 ENCOUNTER — Ambulatory Visit: Payer: 59 | Admitting: Family Medicine

## 2021-03-26 VITALS — BP 101/78 | HR 78 | Temp 97.9°F | Ht 63.0 in | Wt 254.0 lb

## 2021-03-26 DIAGNOSIS — J302 Other seasonal allergic rhinitis: Secondary | ICD-10-CM | POA: Diagnosis not present

## 2021-03-26 DIAGNOSIS — F411 Generalized anxiety disorder: Secondary | ICD-10-CM

## 2021-03-26 DIAGNOSIS — Z1211 Encounter for screening for malignant neoplasm of colon: Secondary | ICD-10-CM

## 2021-03-26 DIAGNOSIS — M791 Myalgia, unspecified site: Secondary | ICD-10-CM

## 2021-03-26 DIAGNOSIS — E039 Hypothyroidism, unspecified: Secondary | ICD-10-CM

## 2021-03-26 DIAGNOSIS — G43109 Migraine with aura, not intractable, without status migrainosus: Secondary | ICD-10-CM

## 2021-03-26 DIAGNOSIS — J4520 Mild intermittent asthma, uncomplicated: Secondary | ICD-10-CM | POA: Diagnosis not present

## 2021-03-26 DIAGNOSIS — R000 Tachycardia, unspecified: Secondary | ICD-10-CM

## 2021-03-26 DIAGNOSIS — B001 Herpesviral vesicular dermatitis: Secondary | ICD-10-CM

## 2021-03-26 DIAGNOSIS — K219 Gastro-esophageal reflux disease without esophagitis: Secondary | ICD-10-CM | POA: Diagnosis not present

## 2021-03-26 MED ORDER — ALBUTEROL SULFATE HFA 108 (90 BASE) MCG/ACT IN AERS
2.0000 | INHALATION_SPRAY | Freq: Four times a day (QID) | RESPIRATORY_TRACT | 2 refills | Status: DC | PRN
  Filled 2021-03-26: qty 18, 25d supply, fill #0

## 2021-03-26 MED ORDER — MONTELUKAST SODIUM 10 MG PO TABS
10.0000 mg | ORAL_TABLET | Freq: Every day | ORAL | 1 refills | Status: DC
  Filled 2021-03-26: qty 90, 90d supply, fill #0
  Filled 2021-08-23: qty 90, 90d supply, fill #1

## 2021-03-26 MED ORDER — OMEPRAZOLE 20 MG PO CPDR
20.0000 mg | DELAYED_RELEASE_CAPSULE | Freq: Every day | ORAL | 1 refills | Status: DC
  Filled 2021-03-26: qty 90, 90d supply, fill #0
  Filled 2021-08-23: qty 90, 90d supply, fill #1

## 2021-03-26 MED ORDER — TOPIRAMATE 50 MG PO TABS
50.0000 mg | ORAL_TABLET | Freq: Two times a day (BID) | ORAL | 1 refills | Status: DC
  Filled 2021-03-26 – 2021-07-12 (×2): qty 180, 90d supply, fill #0
  Filled 2021-10-29: qty 180, 90d supply, fill #1

## 2021-03-26 MED ORDER — SUMATRIPTAN SUCCINATE 50 MG PO TABS
50.0000 mg | ORAL_TABLET | ORAL | 11 refills | Status: DC | PRN
Start: 2021-03-26 — End: 2022-05-22
  Filled 2021-03-26: qty 10, 28d supply, fill #0

## 2021-03-26 MED ORDER — FLUTICASONE PROPIONATE 50 MCG/ACT NA SUSP
2.0000 | Freq: Every day | NASAL | 6 refills | Status: DC
  Filled 2021-03-26: qty 16, 30d supply, fill #0

## 2021-03-26 MED ORDER — VALACYCLOVIR HCL 1 G PO TABS
ORAL_TABLET | ORAL | 1 refills | Status: DC
Start: 2021-03-26 — End: 2022-05-22
  Filled 2021-03-26: qty 30, 15d supply, fill #0

## 2021-03-26 MED ORDER — LORATADINE 10 MG PO TABS
10.0000 mg | ORAL_TABLET | Freq: Every day | ORAL | 1 refills | Status: DC
  Filled 2021-03-26: qty 90, 90d supply, fill #0

## 2021-03-26 MED ORDER — ESCITALOPRAM OXALATE 10 MG PO TABS
10.0000 mg | ORAL_TABLET | Freq: Every day | ORAL | 2 refills | Status: DC
  Filled 2021-03-26: qty 30, 30d supply, fill #0
  Filled 2021-04-29: qty 30, 30d supply, fill #1

## 2021-03-26 MED FILL — Ibuprofen Tab 800 MG: ORAL | 90 days supply | Qty: 270 | Fill #0 | Status: AC

## 2021-03-26 NOTE — Progress Notes (Signed)
Assessment & Plan:  1. GAD (generalized anxiety disorder) Uncontrolled.  Started patient on Lexapro 10 mg once daily.  Discussed we would not be receiving Xanax for long-term management of anxiety.  Education provided on mindfulness based stress reduction. - escitalopram (LEXAPRO) 10 MG tablet; Take 1 tablet (10 mg total) by mouth daily.  Dispense: 30 tablet; Refill: 2  2. Muscle pain Well controlled on current regimen.   3. Seasonal allergies Well controlled on current regimen.  - montelukast (SINGULAIR) 10 MG tablet; Take 1 tablet (10 mg total) by mouth at bedtime.  Dispense: 90 tablet; Refill: 1 - fluticasone (FLONASE) 50 MCG/ACT nasal spray; Place 2 sprays into both nostrils daily.  Dispense: 16 g; Refill: 6 - loratadine (CLARITIN) 10 MG tablet; Take 1 tablet (10 mg total) by mouth daily.  Dispense: 90 tablet; Refill: 1  4. Migraine with aura and without status migrainosus, not intractable Well controlled on current regimen.  - SUMAtriptan (IMITREX) 50 MG tablet; Take 1 tablet (50 mg total) by mouth every 2 (two) hours as needed for migraine. May repeat in 2 hours if headache persists or recurs.  Dispense: 10 tablet; Refill: 11 - topiramate (TOPAMAX) 50 MG tablet; Take 1 tablet (50 mg total) by mouth 2 (two) times daily.  Dispense: 180 tablet; Refill: 1 - CMP14+EGFR  5. Acquired hypothyroidism Labs to assess. - T4, free - TSH  6. Gastroesophageal reflux disease without esophagitis Well controlled on current regimen.  - omeprazole (PRILOSEC) 20 MG capsule; Take 1 capsule (20 mg total) by mouth daily.  Dispense: 90 capsule; Refill: 1 - CMP14+EGFR  7. Cold sore Continue to use as needed. - valACYclovir (VALTREX) 1000 MG tablet; Take 2 tablets (2,000 mg) by mouth every 12 hours x1 day at onset of symptoms of a cold sore. Use as needed.  Dispense: 30 tablet; Refill: 1  8. Mild intermittent asthma without complication Rx'd Albuterol to have as needed. - albuterol (VENTOLIN HFA)  108 (90 Base) MCG/ACT inhaler; Inhale 2 puffs into the lungs every 6 (six) hours as needed.  Dispense: 18 g; Refill: 2  9. Morbid obesity ([place]) Diet and exercise encouraged. - CBC with Differential/Platelet - CMP14+EGFR - Lipid panel  10. Tachycardia Labs to assess for cause. - CBC with Differential/Platelet - CMP14+EGFR - T4, free - TSH  11. Colon cancer screening - Ambulatory referral to Gastroenterology   Return in about 6 weeks (around 05/07/2021) for Anxiety.  Hendricks Limes, MSN, APRN, FNP-C [facility]  Subjective:    Patient ID: Summer [name], female    DOB: May 29, 1976, 45 y.o.   MRN: [id number]  Patient Care Team: Loman Brooklyn, FNP as PCP - General (Family Medicine)   Chief Complaint:  Chief Complaint  Patient presents with  . Establish Care    Jones pt. Patient states when she is resting her HR has been going up to 120-130 x 1 week     HPI: Summer [name] is a 45 y.o. female presenting on 03/26/2021 for Establish Care Ronnald Ramp pt. Patient states when she is resting her HR has been going up to 120-130 x 1 week/)  Patient reports she was previously taking Xanax and Wellbutrin due to anxiety.  She has been out of both medications for at least a year now.  GAD 7 : Generalized Anxiety Score 03/26/2021 01/17/2016  Nervous, Anxious, on Edge 1 2  Control/stop worrying 1 3  Worry too much - different things 1 2  Trouble relaxing 0 2  Restless 0 0  Easily annoyed or irritable 1 2  Afraid - awful might happen 0 2  Total GAD 7 Score 4 13  Anxiety Difficulty Not difficult at all Very difficult   Depression screen [facility] 2/9 03/26/2021 01/17/2019 08/19/2017  Decreased Interest _0 Down, Depressed, Hopeless 1 1 0  PHQ - 2 Score _1 Altered sleeping 1 1 -  Tired, decreased energy 1 1 -  Change in appetite 0 1 -  Feeling bad or failure about yourself  0 1 -  Trouble concentrating 0 2 -  Moving slowly or fidgety/restless 0 0 -  Suicidal  thoughts 0 0 -  PHQ-9 Score 4 8 -  Difficult doing work/chores Not difficult at all - -    She reports she has a prescription for Flexeril that she takes 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  She is taking Claritin, Singulair, and Flonase for allergies.  Migraines: Topamax for preventive therapy.  Imitrex and ibuprofen for abortive therapy.  Patient states she takes ibuprofen twice daily for pain in general.  Takes Imitrex 1-2 times per month.  Hypothyroidism: Has been off of levothyroxine x1 year.  GERD: Well-controlled with Prilosec.  Takes Valtrex as needed for fever blisters.  Asthma: Does not have an albuterol inhaler.   New complaints: Patient reports tachycardia (120-130s).   Social history:  Relevant past medical, surgical, family and social history reviewed and updated as indicated. Interim medical history since our last visit reviewed.  Allergies and medications reviewed and updated.  DATA REVIEWED: CHART IN EPIC  ROS: Negative unless specifically indicated above in HPI.    Current Outpatient Medications:  .  cyclobenzaprine (FLEXERIL) 10 MG tablet, TAKE 1 TABLET BY MOUTH 3 TIMES DAILY AS NEEDED FOR MUSCLE SPASMS., Disp: 60 tablet, Rfl: 0 .  fluticasone (FLONASE) 50 MCG/ACT nasal spray, Place 2 sprays into both nostrils daily., Disp: 16 g, Rfl: 6 .  furosemide (LASIX) 20 MG tablet, Take 1 tablet (20 mg total) by mouth daily., Disp: 30 tablet, Rfl: 3 .  ibuprofen (ADVIL) 800 MG tablet, TAKE 1 TABLET BY MOUTH 3 TIMES DAILY, Disp: 270 tablet, Rfl: 3 .  loratadine (CLARITIN) 10 MG tablet, Take 1 tablet (10 mg total) by mouth daily., Disp: 30 tablet, Rfl: 11 .  montelukast (SINGULAIR) 10 MG tablet, Take 1 tablet (10 mg total) by mouth at bedtime., Disp: 90 tablet, Rfl: 3 .  omeprazole (PRILOSEC) 20 MG capsule, Take 1 capsule (20 mg total) by mouth daily., Disp: 90 capsule, Rfl: 3 .  SUMAtriptan (IMITREX) 50 MG tablet, Take 1 tablet (50 mg total) by  mouth every 2 (two) hours as needed for migraine. May repeat in 2 hours if headache persists or recurs., Disp: 10 tablet, Rfl: 11 .  topiramate (TOPAMAX) 50 MG tablet, TAKE 1 TABLET BY MOUTH TWICE DAILY, Disp: 180 tablet, Rfl: 0 .  valACYclovir (VALTREX) 1000 MG tablet, Take 2 tablets (2,000 mg total) by mouth 2 (two) times daily., Disp: 4 tablet, Rfl: 0 .  buPROPion (WELLBUTRIN XL) 300 MG 24 hr tablet, Take 1 tablet (300 mg total) by mouth daily. (Patient not taking: No sig reported), Disp: 90 tablet, Rfl: 3 .  levothyroxine (SYNTHROID) 75 MCG tablet, Take 1 tablet (75 mcg total) by mouth daily. (Patient not taking: No sig reported), Disp: 90 tablet, Rfl: 3   Allergies  Allergen Reactions  . Tramadol     Vomiting and nausea   Past  Medical History:  Diagnosis Date  . Anxiety   . Asthma   . GERD (gastroesophageal reflux disease)     Past Surgical History:  Procedure Laterality Date  . CHOLECYSTECTOMY N/A 02/21/2014   Procedure: LAPAROSCOPIC CHOLECYSTECTOMY;  Surgeon: Jamesetta So, MD;  Location: [facility];  Service: General;  Laterality: N/A;  . GALLBLADDER SURGERY    . TUBAL LIGATION    . TYMPANOSTOMY TUBE PLACEMENT Bilateral     Social History   Socioeconomic History  . Marital status: Married    Spouse name: Not on file  . Number of children: Not on file  . Years of education: Not on file  . Highest education level: Not on file  Occupational History  . Not on file  Tobacco Use  . Smoking status: Former Smoker    Packs/day: 0.50    Years: 3.00    Pack years: 1.50    Types: Cigarettes    Quit date: 02/20/1998    Years since quitting: 23.1  . Smokeless tobacco: Never Used  Vaping Use  . Vaping Use: Never used  Substance and Sexual Activity  . Alcohol use: No  . Drug use: No  . Sexual activity: Yes    Birth control/protection: Surgical  Other Topics Concern  . Not on file  Social History Narrative  . Not on file   Social Determinants of Health   Financial Resource  Strain: Not on file  Food Insecurity: Not on file  Transportation Needs: Not on file  Physical Activity: Not on file  Stress: Not on file  Social Connections: Not on file  Intimate Partner Violence: Not on file        Objective:    BP 101/78   Pulse 78   Temp 97.9 F (36.6 C) (Temporal)   Ht _0  (1.6 m)   Wt 254 lb (115.2 kg)   SpO2 95%   BMI 44.99 kg/m   Wt Readings from Last 3 Encounters:  03/26/21 254 lb (115.2 kg)  02/03/21 252 lb 6.4 oz (114.5 kg)  01/17/19 249 lb 3.2 oz (113 kg)    Physical Exam Vitals reviewed.  Constitutional:      General: She is not in acute distress.    Appearance: Normal appearance. She is morbidly obese. She is not ill-appearing, toxic-appearing or diaphoretic.  HENT:     Head: Normocephalic and atraumatic.  Eyes:     General: No scleral icterus.       Right eye: No discharge.        Left eye: No discharge.     Conjunctiva/sclera: Conjunctivae normal.  Cardiovascular:     Rate and Rhythm: Normal rate and regular rhythm.     Heart sounds: Normal heart sounds. No murmur heard. No friction rub. No gallop.   Pulmonary:     Effort: Pulmonary effort is normal. No respiratory distress.     Breath sounds: Normal breath sounds. No stridor. No wheezing, rhonchi or rales.  Musculoskeletal:        General: Normal range of motion.     Cervical back: Normal range of motion.  Skin:    General: Skin is warm and dry.     Capillary Refill: Capillary refill takes less than 2 seconds.  Neurological:     General: No focal deficit present.     Mental Status: She is alert and oriented to person, place, and time. Mental status is at baseline.  Psychiatric:        Mood and Affect:  Mood normal.        Behavior: Behavior normal.        Thought Content: Thought content normal.        Judgment: Judgment normal.     Lab Results  Component Value Date   TSH 3.960 05/05/2019   Lab Results  Component Value Date   WBC 9.2 05/21/2020   HGB 14.5  05/21/2020   HCT 45.4 05/21/2020   MCV 93.8 05/21/2020   PLT 290 05/21/2020   Lab Results  Component Value Date   NA 137 05/21/2020   K 3.4 (L) 05/21/2020   CO2 24 05/21/2020   GLUCOSE 88 05/21/2020   BUN 11 05/21/2020   CREATININE 0.83 05/21/2020   BILITOT 0.3 05/21/2020   ALKPHOS 81 05/21/2020   AST 19 05/21/2020   ALT 17 05/21/2020   PROT 7.7 05/21/2020   ALBUMIN 3.6 05/21/2020   CALCIUM 8.6 (L) 05/21/2020   ANIONGAP 9 05/21/2020   Lab Results  Component Value Date   CHOL 162 01/17/2019   Lab Results  Component Value Date   HDL 35 (L) 01/17/2019   Lab Results  Component Value Date   LDLCALC 95 01/17/2019   Lab Results  Component Value Date   TRIG 161 (H) 01/17/2019   Lab Results  Component Value Date   CHOLHDL 4.6 (H) 01/17/2019   No results found for: HGBA1C

## 2021-03-26 NOTE — Patient Instructions (Signed)
Mindfulness-Based Stress Reduction Mindfulness-based stress reduction (MBSR) is a program that helps people learn to practice mindfulness. Mindfulness is the practice of intentionally paying attention to the present moment. MBSR focuses on developing self-awareness, which allows you to respond to life stress without judgment or negative emotions. It can be learned and practiced through techniques such as education, breathing exercises, meditation, and yoga. MBSR includes several mindfulness techniques in one program. MBSR works best when you understand the treatment, are willing to try new things, and can commit to spending time practicing what you learn. MBSR training may include learning about:  How your emotions, thoughts, and reactions affect your body.  New ways to respond to things that cause negative thoughts to start (triggers).  How to notice your thoughts and let go of them.  Practicing awareness of everyday things that you normally do without thinking.  The techniques and goals of different types of meditation. What are the benefits of MBSR? MBSR can have many benefits, which include helping you to:  Develop self-awareness. This refers to knowing and understanding yourself.  Learn skills and attitudes that help you to participate in your own health care.  Learn new ways to care for yourself.  Be more accepting about how things are, and let things go.  Be less judgmental and approach things with an open mind.  Be patient with yourself and trust yourself more. MBSR has also been shown to:  Reduce negative emotions, such as depression and anxiety.  Improve memory and focus.  Change how you sense and approach pain.  Boost your body's ability to fight infections.  Help you connect better with other people.  Improve your sense of well-being. Follow these instructions at home:  Find a local in-person or online MBSR program.  Set aside some time regularly for  mindfulness practice.  Find a mindfulness practice that works best for you. This may include one or more of the following: ? Meditation. Meditation involves focusing your mind on a certain thought or activity. ? Breathing awareness exercises. These help you to stay present by focusing on your breath. ? Body scan. For this practice, you lie down and pay attention to each part of your body from head to toe. You can identify tension and soreness and intentionally relax parts of your body. ? Yoga. Yoga involves stretching and breathing, and it can improve your ability to move and be flexible. It can also provide an experience of testing your body's limits, which can help you release stress. ? Mindful eating. This way of eating involves focusing on the taste, texture, color, and smell of each bite of food. Because this slows down eating and helps you feel full sooner, it can be an important part of a weight-loss plan.  Find a podcast or recording that provides guidance for breathing awareness, body scan, or meditation exercises. You can listen to these any time when you have a free moment to rest without distractions.  Follow your treatment plan as told by your health care provider. This may include taking regular medicines and making changes to your diet or lifestyle as recommended.   How to practice mindfulness To do a basic awareness exercise:  Find a comfortable place to sit.  Pay attention to the present moment. Observe your thoughts, feelings, and surroundings just as they are.  Avoid placing judgment on yourself, your feelings, or your surroundings. Make note of any judgment that comes up, and let it go.  Your mind may wander, and that   is okay. Make note of when your thoughts drift, and return your attention to the present moment. To do basic mindfulness meditation:  Find a comfortable place to sit. This may include a stable chair or a firm floor cushion. ? Sit upright with your back  straight. Let your arms fall next to your side with your hands resting on your legs. ? If sitting in a chair, rest your feet flat on the floor. ? If sitting on a cushion, cross your legs in front of you.  Keep your head in a neutral position with your chin dropped slightly. Relax your jaw and rest the tip of your tongue on the roof of your mouth. Drop your gaze to the floor. You can close your eyes if you like.  Breathe normally and pay attention to your breath. Feel the air moving in and out of your nose. Feel your belly expanding and relaxing with each breath.  Your mind may wander, and that is okay. Make note of when your thoughts drift, and return your attention to your breath.  Avoid placing judgment on yourself, your feelings, or your surroundings. Make note of any judgment or feelings that come up, let them go, and bring your attention back to your breath.  When you are ready, lift your gaze or open your eyes. Pay attention to how your body feels after the meditation. Where to find more information You can find more information about MBSR from:  Your health care provider.  Community-based meditation centers or programs.  Programs offered near you. Summary  Mindfulness-based stress reduction (MBSR) is a program that teaches you how to intentionally pay attention to the present moment. It is used with other treatments to help you cope better with daily stress, emotions, and pain.  MBSR focuses on developing self-awareness, which allows you to respond to life stress without judgment or negative emotions.  MBSR programs may involve learning different mindfulness practices, such as breathing exercises, meditation, yoga, body scan, or mindful eating. Find a mindfulness practice that works best for you, and set aside time for it on a regular basis. This information is not intended to replace advice given to you by your health care provider. Make sure you discuss any questions you have  with your health care provider. Document Revised: 07/12/2020 Document Reviewed: 07/12/2020 Elsevier Patient Education  2021 Elsevier Inc.  

## 2021-03-27 ENCOUNTER — Encounter: Payer: Self-pay | Admitting: Family Medicine

## 2021-03-27 ENCOUNTER — Other Ambulatory Visit: Payer: Self-pay | Admitting: Family Medicine

## 2021-03-27 ENCOUNTER — Other Ambulatory Visit (HOSPITAL_COMMUNITY): Payer: Self-pay

## 2021-03-27 DIAGNOSIS — E038 Other specified hypothyroidism: Secondary | ICD-10-CM

## 2021-03-27 DIAGNOSIS — E782 Mixed hyperlipidemia: Secondary | ICD-10-CM | POA: Insufficient documentation

## 2021-03-27 HISTORY — DX: Mixed hyperlipidemia: E78.2

## 2021-03-27 LAB — CMP14+EGFR
ALT: 18 IU/L (ref 0–32)
AST: 17 IU/L (ref 0–40)
Albumin/Globulin Ratio: 1.3 (ref 1.2–2.2)
Albumin: 4 g/dL (ref 3.8–4.8)
Alkaline Phosphatase: 106 IU/L (ref 44–121)
BUN/Creatinine Ratio: 13 (ref 9–23)
BUN: 10 mg/dL (ref 6–24)
Bilirubin Total: 0.3 mg/dL (ref 0.0–1.2)
CO2: 21 mmol/L (ref 20–29)
Calcium: 9.3 mg/dL (ref 8.7–10.2)
Chloride: 102 mmol/L (ref 96–106)
Creatinine, Ser: 0.76 mg/dL (ref 0.57–1.00)
Globulin, Total: 3.1 g/dL (ref 1.5–4.5)
Glucose: 94 mg/dL (ref 65–99)
Potassium: 4 mmol/L (ref 3.5–5.2)
Sodium: 137 mmol/L (ref 134–144)
Total Protein: 7.1 g/dL (ref 6.0–8.5)
eGFR: 98 mL/min/{1.73_m2} (ref >59)

## 2021-03-27 LAB — CBC WITH DIFFERENTIAL/PLATELET
Basophils Absolute: 0.1 10*3/uL (ref 0.0–0.2)
Basos: 1 %
EOS (ABSOLUTE): 0.3 10*3/uL (ref 0.0–0.4)
Eos: 4 %
Hematocrit: 44.1 % (ref 34.0–46.6)
Hemoglobin: 14.8 g/dL (ref 11.1–15.9)
Immature Grans (Abs): 0 10*3/uL (ref 0.0–0.1)
Immature Granulocytes: 0 %
Lymphocytes Absolute: 2.4 10*3/uL (ref 0.7–3.1)
Lymphs: 31 %
MCH: 30.2 pg (ref 26.6–33.0)
MCHC: 33.6 g/dL (ref 31.5–35.7)
MCV: 90 fL (ref 79–97)
Monocytes Absolute: 0.5 10*3/uL (ref 0.1–0.9)
Monocytes: 7 %
Neutrophils Absolute: 4.4 10*3/uL (ref 1.4–7.0)
Neutrophils: 57 %
Platelets: 298 10*3/uL (ref 150–450)
RBC: 4.9 x10E6/uL (ref 3.77–5.28)
RDW: 12.7 % (ref 11.7–15.4)
WBC: 7.7 10*3/uL (ref 3.4–10.8)

## 2021-03-27 LAB — LIPID PANEL
Chol/HDL Ratio: 5.4 ratio — ABNORMAL HIGH (ref 0.0–4.4)
Cholesterol, Total: 201 mg/dL — ABNORMAL HIGH (ref 100–199)
HDL: 37 mg/dL — ABNORMAL LOW (ref >39)
LDL Chol Calc (NIH): 117 mg/dL — ABNORMAL HIGH (ref 0–99)
Triglycerides: 269 mg/dL — ABNORMAL HIGH (ref 0–149)
VLDL Cholesterol Cal: 47 mg/dL — ABNORMAL HIGH (ref 5–40)

## 2021-03-27 LAB — T4, FREE: Free T4: 0.87 ng/dL (ref 0.82–1.77)

## 2021-03-27 LAB — TSH: TSH: 6.77 u[IU]/mL — ABNORMAL HIGH (ref 0.450–4.500)

## 2021-03-27 MED ORDER — LEVOTHYROXINE SODIUM 25 MCG PO TABS
25.0000 ug | ORAL_TABLET | Freq: Every day | ORAL | 2 refills | Status: DC
  Filled 2021-03-27: qty 30, 30d supply, fill #0
  Filled 2021-04-29: qty 30, 30d supply, fill #1

## 2021-03-31 ENCOUNTER — Other Ambulatory Visit (HOSPITAL_COMMUNITY): Payer: Self-pay

## 2021-03-31 ENCOUNTER — Encounter: Payer: Self-pay | Admitting: Family Medicine

## 2021-03-31 DIAGNOSIS — B001 Herpesviral vesicular dermatitis: Secondary | ICD-10-CM | POA: Insufficient documentation

## 2021-03-31 DIAGNOSIS — M791 Myalgia, unspecified site: Secondary | ICD-10-CM | POA: Insufficient documentation

## 2021-03-31 DIAGNOSIS — J4520 Mild intermittent asthma, uncomplicated: Secondary | ICD-10-CM | POA: Insufficient documentation

## 2021-03-31 DIAGNOSIS — J302 Other seasonal allergic rhinitis: Secondary | ICD-10-CM | POA: Insufficient documentation

## 2021-04-02 ENCOUNTER — Encounter (INDEPENDENT_AMBULATORY_CARE_PROVIDER_SITE_OTHER): Payer: Self-pay | Admitting: *Deleted

## 2021-04-29 ENCOUNTER — Other Ambulatory Visit (HOSPITAL_COMMUNITY): Payer: Self-pay

## 2021-05-07 ENCOUNTER — Encounter: Payer: Self-pay | Admitting: Family Medicine

## 2021-05-07 ENCOUNTER — Ambulatory Visit (INDEPENDENT_AMBULATORY_CARE_PROVIDER_SITE_OTHER): Payer: 59 | Admitting: Family Medicine

## 2021-05-07 ENCOUNTER — Other Ambulatory Visit (HOSPITAL_COMMUNITY): Payer: Self-pay

## 2021-05-07 VITALS — BP 101/71 | HR 82 | Temp 97.9°F | Ht 63.0 in | Wt 252.4 lb

## 2021-05-07 DIAGNOSIS — F411 Generalized anxiety disorder: Secondary | ICD-10-CM | POA: Diagnosis not present

## 2021-05-07 DIAGNOSIS — R600 Localized edema: Secondary | ICD-10-CM

## 2021-05-07 DIAGNOSIS — R000 Tachycardia, unspecified: Secondary | ICD-10-CM

## 2021-05-07 DIAGNOSIS — Z0000 Encounter for general adult medical examination without abnormal findings: Secondary | ICD-10-CM

## 2021-05-07 DIAGNOSIS — E038 Other specified hypothyroidism: Secondary | ICD-10-CM

## 2021-05-07 MED ORDER — FLUOXETINE HCL 20 MG PO CAPS
20.0000 mg | ORAL_CAPSULE | Freq: Every day | ORAL | 2 refills | Status: DC
  Filled 2021-05-07 – 2021-06-13 (×3): qty 30, 30d supply, fill #0

## 2021-05-07 MED ORDER — METOPROLOL TARTRATE 25 MG PO TABS
12.5000 mg | ORAL_TABLET | Freq: Two times a day (BID) | ORAL | 2 refills | Status: DC | PRN
Start: 2021-05-07 — End: 2022-05-22
  Filled 2021-05-07: qty 30, 30d supply, fill #0

## 2021-05-07 NOTE — Progress Notes (Signed)
Assessment & Plan:  1. GAD (generalized anxiety disorder) Uncontrolled. Lexapro D/C'd. Started Prozac.  - FLUoxetine (PROZAC) 20 MG capsule; Take 1 capsule (20 mg total) by mouth daily.  Dispense: 30 capsule; Refill: 2  2. Subclinical hypothyroidism Labs to assess for improvement after starting levothyroxine. - TSH - T4, free  3. Tachycardia Started metoprolol as needed. - metoprolol tartrate (LOPRESSOR) 25 MG tablet; Take 1/2 tablet (12.5 mg total) by mouth 2 times daily as needed (high heart rate).  Dispense: 30 tablet; Refill: 2  4. Healthcare maintenance Waiting on an appointment from gastroenterology for screening colonoscopy.   5. Bilateral lower extremity edema Encouraged application of compression hose and elevation of her feet when sitting.    Return in about 6 weeks (around 06/18/2021) for anxiety & pap smear.  Hendricks Limes, MSN, APRN, FNP-C [facility]  Subjective:    Patient ID: Summer [name], female    DOB: 1976/09/05, 45 y.o.   MRN: [id number]  Patient Care Team: Loman Brooklyn, FNP as PCP - General (Family Medicine)   Chief Complaint:  Chief Complaint  Patient presents with   Anxiety    6 week follow up - patient states that her anxiety is the same      HPI: Summer [name] is a 45 y.o. female presenting on 05/07/2021 for Anxiety (6 week follow up - patient states that her anxiety is the same /)  Patient is following up on anxiety. She was started on Lexapro 10 mg once daily. She reports today she does not feel it has helped. She has had a couple bad days recently due to her daughter.   GAD 7 : Generalized Anxiety Score 05/07/2021 03/26/2021 01/17/2016  Nervous, Anxious, on Edge _0 Control/stop worrying _1 Worry too much - different things _2 Trouble relaxing 1 0 2  Restless 0 0 0  Easily annoyed or irritable _3 Afraid - awful might happen 1 0 2  Total GAD 7 Score _4 Anxiety Difficulty Somewhat  difficult Not difficult at all Very difficult    Tachycardia: Her heart rate has come down. She reports she is going up a couple days per week at night to the 110s. She was previously around 120-130s daily.   Patient was previously referred to gastroenterology for a colonoscopy. She received the questionnaire from them and has completed and mailed back.   New complaints: Patient reports her leg swelling is a little more than it has been. She does eat low salt. She is on her feet a lot. She does not wear compression hose.   Social history:  Relevant past medical, surgical, family and social history reviewed and updated as indicated. Interim medical history since our last visit reviewed.  Allergies and medications reviewed and updated.  DATA REVIEWED: CHART IN EPIC  ROS: Negative unless specifically indicated above in HPI.    Current Outpatient Medications:    albuterol (VENTOLIN HFA) 108 (90 Base) MCG/ACT inhaler, Inhale 2 puffs into the lungs every 6 (six) hours as needed., Disp: 18 g, Rfl: 2   cyclobenzaprine (FLEXERIL) 10 MG tablet, TAKE 1 TABLET BY MOUTH 3 TIMES DAILY AS NEEDED FOR MUSCLE SPASMS., Disp: 60 tablet, Rfl: 0   escitalopram (LEXAPRO) 10 MG tablet, Take 1 tablet (10 mg total) by mouth daily., Disp: 30 tablet, Rfl: 2   fluticasone (FLONASE) 50 MCG/ACT nasal spray, Place 2 sprays into both nostrils daily.,  Disp: 16 g, Rfl: 6   ibuprofen (ADVIL) 800 MG tablet, TAKE 1 TABLET BY MOUTH 3 TIMES DAILY, Disp: 270 tablet, Rfl: 3   levothyroxine (SYNTHROID) 25 MCG tablet, Take 1 tablet (25 mcg total) by mouth daily., Disp: 30 tablet, Rfl: 2   loratadine (CLARITIN) 10 MG tablet, Take 1 tablet (10 mg total) by mouth daily., Disp: 90 tablet, Rfl: 1   montelukast (SINGULAIR) 10 MG tablet, Take 1 tablet (10 mg total) by mouth at bedtime., Disp: 90 tablet, Rfl: 1   omeprazole (PRILOSEC) 20 MG capsule, Take 1 capsule (20 mg total) by mouth daily., Disp: 90 capsule, Rfl: 1   SUMAtriptan  (IMITREX) 50 MG tablet, Take 1 tablet (50 mg total) by mouth every 2 (two) hours as needed for migraine. May repeat in 2 hours if headache persists or recurs., Disp: 10 tablet, Rfl: 11   topiramate (TOPAMAX) 50 MG tablet, Take 1 tablet (50 mg total) by mouth 2 (two) times daily., Disp: 180 tablet, Rfl: 1   valACYclovir (VALTREX) 1000 MG tablet, Take 2 tablets (2,000 mg) by mouth every 12 hours x1 day at onset of symptoms of a cold sore. Use as needed., Disp: 30 tablet, Rfl: 1   Allergies  Allergen Reactions   Tramadol     Vomiting and nausea   Past Medical History:  Diagnosis Date   Anxiety    Asthma    GERD (gastroesophageal reflux disease)    Mixed hyperlipidemia 03/27/2021    Past Surgical History:  Procedure Laterality Date   CHOLECYSTECTOMY N/A 02/21/2014   Procedure: LAPAROSCOPIC CHOLECYSTECTOMY;  Surgeon: Jamesetta So, MD;  Location: [facility];  Service: General;  Laterality: N/A;   GALLBLADDER SURGERY     TUBAL LIGATION     TYMPANOSTOMY TUBE PLACEMENT Bilateral     Social History   Socioeconomic History   Marital status: Married    Spouse name: Not on file   Number of children: Not on file   Years of education: Not on file   Highest education level: Not on file  Occupational History   Not on file  Tobacco Use   Smoking status: Former    Packs/day: 0.50    Years: 3.00    Pack years: 1.50    Types: Cigarettes    Quit date: 02/20/1998    Years since quitting: 23.2   Smokeless tobacco: Never  Vaping Use   Vaping Use: Never used  Substance and Sexual Activity   Alcohol use: No   Drug use: No   Sexual activity: Yes    Birth control/protection: Surgical  Other Topics Concern   Not on file  Social History Narrative   Not on file   Social Determinants of Health   Financial Resource Strain: Not on file  Food Insecurity: Not on file  Transportation Needs: Not on file  Physical Activity: Not on file  Stress: Not on file  Social Connections: Not on file   Intimate Partner Violence: Not on file        Objective:    BP 101/71   Pulse 82   Temp 97.9 F (36.6 C) (Temporal)   Ht _0  (1.6 m)   Wt 252 lb 6.4 oz (114.5 kg)   SpO2 97%   BMI 44.71 kg/m   Wt Readings from Last 3 Encounters:  05/07/21 252 lb 6.4 oz (114.5 kg)  03/26/21 254 lb (115.2 kg)  02/03/21 252 lb 6.4 oz (114.5 kg)    Physical Exam Vitals reviewed.  Constitutional:      General: She is not in acute distress.    Appearance: Normal appearance. She is morbidly obese. She is not ill-appearing, toxic-appearing or diaphoretic.  HENT:     Head: Normocephalic and atraumatic.  Eyes:     General: No scleral icterus.       Right eye: No discharge.        Left eye: No discharge.     Conjunctiva/sclera: Conjunctivae normal.  Cardiovascular:     Rate and Rhythm: Normal rate and regular rhythm.     Heart sounds: Normal heart sounds. No murmur heard.   No friction rub. No gallop.  Pulmonary:     Effort: Pulmonary effort is normal. No respiratory distress.     Breath sounds: Normal breath sounds. No stridor. No wheezing, rhonchi or rales.  Musculoskeletal:        General: Normal range of motion.     Cervical back: Normal range of motion.     Right lower leg: 1+ Edema present.     Left lower leg: 1+ Edema present.  Skin:    General: Skin is warm and dry.     Capillary Refill: Capillary refill takes less than 2 seconds.  Neurological:     General: No focal deficit present.     Mental Status: She is alert and oriented to person, place, and time. Mental status is at baseline.  Psychiatric:        Mood and Affect: Mood normal.        Behavior: Behavior normal.        Thought Content: Thought content normal.        Judgment: Judgment normal.    Lab Results  Component Value Date   TSH 6.770 (H) 03/26/2021   Lab Results  Component Value Date   WBC 7.7 03/26/2021   HGB 14.8 03/26/2021   HCT 44.1 03/26/2021   MCV 90 03/26/2021   PLT 298 03/26/2021   Lab  Results  Component Value Date   NA 137 03/26/2021   K 4.0 03/26/2021   CO2 21 03/26/2021   GLUCOSE 94 03/26/2021   BUN 10 03/26/2021   CREATININE 0.76 03/26/2021   BILITOT 0.3 03/26/2021   ALKPHOS 106 03/26/2021   AST 17 03/26/2021   ALT 18 03/26/2021   PROT 7.1 03/26/2021   ALBUMIN 4.0 03/26/2021   CALCIUM 9.3 03/26/2021   ANIONGAP 9 05/21/2020   EGFR 98 03/26/2021   Lab Results  Component Value Date   CHOL 201 (H) 03/26/2021   Lab Results  Component Value Date   HDL 37 (L) 03/26/2021   Lab Results  Component Value Date   LDLCALC 117 (H) 03/26/2021   Lab Results  Component Value Date   TRIG 269 (H) 03/26/2021   Lab Results  Component Value Date   CHOLHDL 5.4 (H) 03/26/2021   No results found for: HGBA1C

## 2021-05-08 ENCOUNTER — Other Ambulatory Visit: Payer: Self-pay | Admitting: Family Medicine

## 2021-05-08 DIAGNOSIS — E038 Other specified hypothyroidism: Secondary | ICD-10-CM

## 2021-05-08 LAB — TSH: TSH: 5.85 u[IU]/mL — ABNORMAL HIGH (ref 0.450–4.500)

## 2021-05-08 LAB — T4, FREE: Free T4: 0.82 ng/dL (ref 0.82–1.77)

## 2021-05-08 MED ORDER — LEVOTHYROXINE SODIUM 50 MCG PO TABS: 50.0000 ug | ORAL_TABLET | Freq: Every day | ORAL | 2 refills | Status: DC

## 2021-05-10 DIAGNOSIS — R000 Tachycardia, unspecified: Secondary | ICD-10-CM | POA: Insufficient documentation

## 2021-05-15 ENCOUNTER — Other Ambulatory Visit (HOSPITAL_COMMUNITY): Payer: Self-pay

## 2021-05-17 ENCOUNTER — Other Ambulatory Visit (HOSPITAL_COMMUNITY): Payer: Self-pay

## 2021-05-17 MED ORDER — LEVOTHYROXINE SODIUM 25 MCG PO TABS
ORAL_TABLET | ORAL | 2 refills | Status: DC
  Filled 2021-05-17: qty 30, 30d supply, fill #0

## 2021-05-19 ENCOUNTER — Other Ambulatory Visit (HOSPITAL_COMMUNITY): Payer: Self-pay

## 2021-05-19 MED ORDER — LEVOTHYROXINE SODIUM 50 MCG PO TABS
25.0000 ug | ORAL_TABLET | Freq: Every day | ORAL | 1 refills | Status: DC
  Filled 2021-05-19 – 2021-06-13 (×2): qty 90, 90d supply, fill #0

## 2021-05-20 ENCOUNTER — Other Ambulatory Visit (HOSPITAL_COMMUNITY): Payer: Self-pay

## 2021-05-23 ENCOUNTER — Other Ambulatory Visit (HOSPITAL_COMMUNITY): Payer: Self-pay

## 2021-05-26 ENCOUNTER — Other Ambulatory Visit (HOSPITAL_COMMUNITY): Payer: Self-pay

## 2021-06-02 ENCOUNTER — Other Ambulatory Visit (HOSPITAL_COMMUNITY): Payer: Self-pay

## 2021-06-13 ENCOUNTER — Other Ambulatory Visit (HOSPITAL_COMMUNITY): Payer: Self-pay

## 2021-06-13 MED FILL — Ibuprofen Tab 800 MG: ORAL | 90 days supply | Qty: 270 | Fill #1 | Status: AC

## 2021-06-18 ENCOUNTER — Ambulatory Visit: Payer: 59 | Admitting: Family Medicine

## 2021-07-03 ENCOUNTER — Other Ambulatory Visit (HOSPITAL_COMMUNITY): Payer: Self-pay

## 2021-07-03 ENCOUNTER — Other Ambulatory Visit: Payer: Self-pay

## 2021-07-03 ENCOUNTER — Ambulatory Visit: Payer: 59 | Admitting: Family Medicine

## 2021-07-03 ENCOUNTER — Encounter: Payer: Self-pay | Admitting: Family Medicine

## 2021-07-03 ENCOUNTER — Other Ambulatory Visit (HOSPITAL_COMMUNITY)
Admission: RE | Admit: 2021-07-03 | Discharge: 2021-07-03 | Disposition: A | Discharge status: Home / Self Care | Payer: 59 | Source: Ambulatory Visit | Attending: Family Medicine | Admitting: Family Medicine

## 2021-07-03 VITALS — BP 99/62 | HR 81 | Temp 97.3°F | Ht 63.0 in | Wt 246.6 lb

## 2021-07-03 DIAGNOSIS — G43109 Migraine with aura, not intractable, without status migrainosus: Secondary | ICD-10-CM | POA: Diagnosis not present

## 2021-07-03 DIAGNOSIS — Z113 Encounter for screening for infections with a predominantly sexual mode of transmission: Secondary | ICD-10-CM

## 2021-07-03 DIAGNOSIS — E038 Other specified hypothyroidism: Secondary | ICD-10-CM | POA: Diagnosis not present

## 2021-07-03 DIAGNOSIS — Z124 Encounter for screening for malignant neoplasm of cervix: Secondary | ICD-10-CM | POA: Insufficient documentation

## 2021-07-03 DIAGNOSIS — Z1151 Encounter for screening for human papillomavirus (HPV): Secondary | ICD-10-CM | POA: Insufficient documentation

## 2021-07-03 DIAGNOSIS — F411 Generalized anxiety disorder: Secondary | ICD-10-CM | POA: Diagnosis not present

## 2021-07-03 MED ORDER — LEVOTHYROXINE SODIUM 50 MCG PO TABS: 50.0000 ug | ORAL_TABLET | Freq: Every day | ORAL | 1 refills | Status: DC

## 2021-07-03 MED ORDER — FLUOXETINE HCL 20 MG PO CAPS
20.0000 mg | ORAL_CAPSULE | Freq: Every day | ORAL | 1 refills | Status: DC
  Filled 2021-07-03: qty 90, 90d supply, fill #0
  Filled 2021-10-29: qty 90, 90d supply, fill #1

## 2021-07-03 NOTE — Patient Instructions (Signed)
Reminder: please return after mid-September for your flu shot. If you receive this elsewhere, such as your pharmacy, please let us know so we can get this documented in your chart.   

## 2021-07-03 NOTE — Progress Notes (Signed)
Assessment & Plan:  1. GAD (generalized anxiety disorder) - Well controlled on current regimen, tolerating well - FLUoxetine (PROZAC) 20 MG capsule; Take 1 capsule (20 mg total) by mouth daily.  Dispense: 90 capsule; Refill: 1  2. Subclinical hypothyroidism - continue 50 mcg levothyroxine as prescribed - TSH - T4, free  3. Screening for cervical cancer - Cytology - PAP  4. Routine screening for STI (sexually transmitted infection) - Cytology - PAP  5. Screening for human papillomavirus (HPV) - Cytology - PAP  6. Migraine with aura and without status migrainosus, not intractable - continue topamax and ibuprofen, may take tylenol with ibuprofen  - may need to increase topamax if headaches persist    Return in about 6 months (around 01/03/2022) for follow-up of chronic medication conditions.  Lucile Crater, NP Student  Subjective:    Patient ID: Summer [name], female    DOB: Jul 19, 1976, 45 y.o.   MRN: [id number]  Patient Care Team: Loman Brooklyn, FNP as PCP - General (Family Medicine)   Chief Complaint:  Chief Complaint  Patient presents with   Gynecologic Exam   Anxiety    6 week follow up     HPI: Summer [name] is a 45 y.o. female presenting on 07/03/2021 for Gynecologic Exam and Anxiety (6 week follow up )  Patient is following up on anxiety. She was started on prozac 20 mg once daily. She states she is tolerating it well and her anxiety symptoms are improved since the last time she was here.   GAD 7 : Generalized Anxiety Score 07/03/2021 05/07/2021 03/26/2021 01/17/2016  Nervous, Anxious, on Edge '1 1 1 2  ' Control/stop worrying 0 '1 1 3  ' Worry too much - different things 0 '1 1 2  ' Trouble relaxing 0 1 0 2  Restless 0 0 0 0  Easily annoyed or irritable 0 '1 1 2  ' Afraid - awful might happen 0 1 0 2  Total GAD 7 Score '1 6 4 13  ' Anxiety Difficulty Somewhat difficult Somewhat difficult Not difficult at all Very difficult    New complaints: She has previously  had migraines that were well controlled with topamax and ibuprofen. She has noticed over the last 2-3 weeks increased frequency of headaches. She has not taken tylenol or anything else for pain.    Social history:  Relevant past medical, surgical, family and social history reviewed and updated as indicated. Interim medical history since our last visit reviewed.  Allergies and medications reviewed and updated.  DATA REVIEWED: CHART IN EPIC  ROS: Negative unless specifically indicated above in HPI.    Current Outpatient Medications:    albuterol (VENTOLIN HFA) 108 (90 Base) MCG/ACT inhaler, Inhale 2 puffs into the lungs every 6 (six) hours as needed., Disp: 18 g, Rfl: 2   cyclobenzaprine (FLEXERIL) 10 MG tablet, TAKE 1 TABLET BY MOUTH 3 TIMES DAILY AS NEEDED FOR MUSCLE SPASMS., Disp: 60 tablet, Rfl: 0   fluticasone (FLONASE) 50 MCG/ACT nasal spray, Place 2 sprays into both nostrils daily., Disp: 16 g, Rfl: 6   ibuprofen (ADVIL) 800 MG tablet, TAKE 1 TABLET BY MOUTH 3 TIMES DAILY, Disp: 270 tablet, Rfl: 3   loratadine (CLARITIN) 10 MG tablet, Take 1 tablet (10 mg total) by mouth daily., Disp: 90 tablet, Rfl: 1   metoprolol tartrate (LOPRESSOR) 25 MG tablet, Take 1/2 tablet (12.5 mg total) by mouth 2 times daily as needed (high heart rate)., Disp: 30 tablet, Rfl: 2   montelukast (SINGULAIR)  10 MG tablet, Take 1 tablet (10 mg total) by mouth at bedtime., Disp: 90 tablet, Rfl: 1   omeprazole (PRILOSEC) 20 MG capsule, Take 1 capsule (20 mg total) by mouth daily., Disp: 90 capsule, Rfl: 1   SUMAtriptan (IMITREX) 50 MG tablet, Take 1 tablet (50 mg total) by mouth every 2 (two) hours as needed for migraine. May repeat in 2 hours if headache persists or recurs., Disp: 10 tablet, Rfl: 11   topiramate (TOPAMAX) 50 MG tablet, Take 1 tablet (50 mg total) by mouth 2 (two) times daily., Disp: 180 tablet, Rfl: 1   valACYclovir (VALTREX) 1000 MG tablet, Take 2 tablets (2,000 mg) by mouth every 12 hours x1 day  at onset of symptoms of a cold sore. Use as needed., Disp: 30 tablet, Rfl: 1   FLUoxetine (PROZAC) 20 MG capsule, Take 1 capsule (20 mg total) by mouth daily., Disp: 90 capsule, Rfl: 1   levothyroxine (SYNTHROID) 50 MCG tablet, Take 1 tablet (50 mcg total) by mouth daily., Disp: 90 tablet, Rfl: 1   Allergies  Allergen Reactions   Tramadol     Vomiting and nausea   Past Medical History:  Diagnosis Date   Anxiety    Asthma    GERD (gastroesophageal reflux disease)    Mixed hyperlipidemia 03/27/2021    Past Surgical History:  Procedure Laterality Date   CHOLECYSTECTOMY N/A 02/21/2014   Procedure: LAPAROSCOPIC CHOLECYSTECTOMY;  Surgeon: Jamesetta So, MD;  Location: [facility];  Service: General;  Laterality: N/A;   GALLBLADDER SURGERY     TUBAL LIGATION     TYMPANOSTOMY TUBE PLACEMENT Bilateral     Social History   Socioeconomic History   Marital status: Married    Spouse name: Not on file   Number of children: Not on file   Years of education: Not on file   Highest education level: Not on file  Occupational History   Not on file  Tobacco Use   Smoking status: Former    Packs/day: 0.50    Years: 3.00    Pack years: 1.50    Types: Cigarettes    Quit date: 02/20/1998    Years since quitting: 23.3   Smokeless tobacco: Never  Vaping Use   Vaping Use: Never used  Substance and Sexual Activity   Alcohol use: No   Drug use: No   Sexual activity: Yes    Birth control/protection: Surgical  Other Topics Concern   Not on file  Social History Narrative   Not on file   Social Determinants of Health   Financial Resource Strain: Not on file  Food Insecurity: Not on file  Transportation Needs: Not on file  Physical Activity: Not on file  Stress: Not on file  Social Connections: Not on file  Intimate Partner Violence: Not on file        Objective:    BP 99/62   Pulse 81   Temp (!) 97.3 F (36.3 C) (Temporal)   Ht '5\' 3"'  (1.6 m)   Wt 111.9 kg   SpO2 97%   BMI 43.68  kg/m   Wt Readings from Last 3 Encounters:  07/03/21 111.9 kg  05/07/21 114.5 kg  03/26/21 115.2 kg    Physical Exam Vitals reviewed. Exam conducted with a chaperone present.  Constitutional:      General: She is not in acute distress.    Appearance: Normal appearance. She is morbidly obese. She is not ill-appearing, toxic-appearing or diaphoretic.  HENT:  Head: Normocephalic and atraumatic.  Eyes:     General: No scleral icterus.       Right eye: No discharge.        Left eye: No discharge.     Conjunctiva/sclera: Conjunctivae normal.  Cardiovascular:     Rate and Rhythm: Normal rate and regular rhythm.     Heart sounds: Normal heart sounds. No murmur heard.   No friction rub. No gallop.  Pulmonary:     Effort: Pulmonary effort is normal. No respiratory distress.     Breath sounds: Normal breath sounds. No stridor. No wheezing, rhonchi or rales.  Genitourinary:    General: Normal vulva.     Exam position: Lithotomy position.     Labia:        Right: No rash, tenderness, lesion or injury.        Left: No rash, tenderness, lesion or injury.      Vagina: Normal. No signs of injury and foreign body. No vaginal discharge, erythema, tenderness, bleeding, lesions or prolapsed vaginal walls.     Cervix: Discharge (clear mucous) present. No friability, lesion or erythema.     Uterus: Normal. Not deviated, not enlarged and not tender.      Adnexa: Right adnexa normal and left adnexa normal.       Right: No mass, tenderness or fullness.         Left: No mass, tenderness or fullness.    Musculoskeletal:        General: Normal range of motion.     Cervical back: Normal range of motion.     Right lower leg: 1+ Edema present.     Left lower leg: 1+ Edema present.  Skin:    General: Skin is warm and dry.     Capillary Refill: Capillary refill takes less than 2 seconds.  Neurological:     General: No focal deficit present.     Mental Status: She is alert and oriented to person,  place, and time. Mental status is at baseline.  Psychiatric:        Mood and Affect: Mood normal.        Behavior: Behavior normal.        Thought Content: Thought content normal.        Judgment: Judgment normal.    Lab Results  Component Value Date   TSH 5.850 (H) 05/07/2021   Lab Results  Component Value Date   WBC 7.7 03/26/2021   HGB 14.8 03/26/2021   HCT 44.1 03/26/2021   MCV 90 03/26/2021   PLT 298 03/26/2021   Lab Results  Component Value Date   NA 137 03/26/2021   K 4.0 03/26/2021   CO2 21 03/26/2021   GLUCOSE 94 03/26/2021   BUN 10 03/26/2021   CREATININE 0.76 03/26/2021   BILITOT 0.3 03/26/2021   ALKPHOS 106 03/26/2021   AST 17 03/26/2021   ALT 18 03/26/2021   PROT 7.1 03/26/2021   ALBUMIN 4.0 03/26/2021   CALCIUM 9.3 03/26/2021   ANIONGAP 9 05/21/2020   EGFR 98 03/26/2021   Lab Results  Component Value Date   CHOL 201 (H) 03/26/2021   Lab Results  Component Value Date   HDL 37 (L) 03/26/2021   Lab Results  Component Value Date   LDLCALC 117 (H) 03/26/2021   Lab Results  Component Value Date   TRIG 269 (H) 03/26/2021   Lab Results  Component Value Date   CHOLHDL 5.4 (H) 03/26/2021   No results found  for: HGBA1C

## 2021-07-04 ENCOUNTER — Other Ambulatory Visit (HOSPITAL_COMMUNITY): Payer: Self-pay

## 2021-07-04 LAB — TSH: TSH: 3.97 u[IU]/mL (ref 0.450–4.500)

## 2021-07-04 LAB — T4, FREE: Free T4: 0.91 ng/dL (ref 0.82–1.77)

## 2021-07-07 ENCOUNTER — Other Ambulatory Visit (HOSPITAL_COMMUNITY): Payer: Self-pay

## 2021-07-07 LAB — CYTOLOGY - PAP
Chlamydia: NEGATIVE
Comment: NEGATIVE
Comment: NEGATIVE
Comment: NORMAL
Diagnosis: NEGATIVE
Neisseria Gonorrhea: NEGATIVE
Trichomonas: NEGATIVE

## 2021-07-12 ENCOUNTER — Other Ambulatory Visit (HOSPITAL_COMMUNITY): Payer: Self-pay

## 2021-08-23 ENCOUNTER — Other Ambulatory Visit (HOSPITAL_COMMUNITY): Payer: Self-pay

## 2021-09-25 ENCOUNTER — Other Ambulatory Visit (HOSPITAL_COMMUNITY): Payer: Self-pay

## 2021-09-25 ENCOUNTER — Other Ambulatory Visit: Payer: Self-pay | Admitting: *Deleted

## 2021-09-25 ENCOUNTER — Other Ambulatory Visit: Payer: Self-pay | Admitting: Family Medicine

## 2021-09-25 ENCOUNTER — Telehealth: Payer: 59 | Admitting: Physician Assistant

## 2021-09-25 DIAGNOSIS — J019 Acute sinusitis, unspecified: Secondary | ICD-10-CM

## 2021-09-25 DIAGNOSIS — B9789 Other viral agents as the cause of diseases classified elsewhere: Secondary | ICD-10-CM | POA: Diagnosis not present

## 2021-09-25 MED ORDER — BENZONATATE 100 MG PO CAPS: 100.0000 mg | ORAL_CAPSULE | Freq: Three times a day (TID) | ORAL | 0 refills | Status: DC | PRN

## 2021-09-25 MED ORDER — LEVOTHYROXINE SODIUM 50 MCG PO TABS
50.0000 ug | ORAL_TABLET | Freq: Every day | ORAL | 1 refills | Status: DC
  Filled 2021-09-25: qty 90, 90d supply, fill #0
  Filled 2022-01-22: qty 90, 90d supply, fill #1

## 2021-09-25 MED ORDER — AZELASTINE HCL 0.1 % NA SOLN: 1.0000 | Freq: Two times a day (BID) | NASAL | 0 refills | Status: DC

## 2021-09-25 MED FILL — Ibuprofen Tab 800 MG: ORAL | 90 days supply | Qty: 270 | Fill #2 | Status: AC

## 2021-09-25 NOTE — Progress Notes (Signed)
I have spent 5 minutes in review of e-visit questionnaire, review and updating patient chart, medical decision making and response to patient.   Kyuss Hale Cody Ronny Ruddell, PA-C    

## 2021-09-25 NOTE — Progress Notes (Signed)
E-Visit for Sinus Problems  We are sorry that you are not feeling well.  Here is how we plan to help!  Based on what you have shared with me it looks like you have sinusitis.  Sinusitis is inflammation and infection in the sinus cavities of the head.  Based on your presentation I believe you most likely have Acute Viral Sinusitis.This is an infection most likely caused by a virus. There is not specific treatment for viral sinusitis other than to help you with the symptoms until the infection runs its course.  You may use an oral decongestant such as Mucinex D or if you have glaucoma or high blood pressure use plain Mucinex. Saline nasal spray help and can safely be used as often as needed for congestion, I have prescribed: Azelastine nasal spray 2 sprays in each nostril twice a day. Use this in addition to your Flonase. I have also sent in a prescription cough medication, Tessalon, to help with cough.   Some authorities believe that zinc sprays or the use of Echinacea may shorten the course of your symptoms.  Sinus infections are not as easily transmitted as other respiratory infection, however we still recommend that you avoid close contact with loved ones, especially the very young and elderly.  Remember to wash your hands thoroughly throughout the day as this is the number one way to prevent the spread of infection!  Providers prescribe antibiotics to treat infections caused by bacteria. Antibiotics are very powerful in treating bacterial infections when they are used properly. To maintain their effectiveness, they should be used only when necessary. Overuse of antibiotics has resulted in the development of superbugs that are resistant to treatment!    After careful review of your answers, I would not recommend an antibiotic for your condition.  Antibiotics are not effective against viruses and therefore should not be used to treat them. Common examples of infections caused by viruses include colds  and flu   Home Care: Only take medications as instructed by your medical team. Do not take these medications with alcohol. A steam or ultrasonic humidifier can help congestion.  You can place a towel over your head and breathe in the steam from hot water coming from a faucet. Avoid close contacts especially the very young and the elderly. Cover your mouth when you cough or sneeze. Always remember to wash your hands.  Get Help Right Away If: You develop worsening fever or sinus pain. You develop a severe head ache or visual changes. Your symptoms persist after you have completed your treatment plan.  Make sure you Understand these instructions. Will watch your condition. Will get help right away if you are not doing well or get worse.   Thank you for choosing an e-visit.  Your e-visit answers were reviewed by a board certified advanced clinical practitioner to complete your personal care plan. Depending upon the condition, your plan could have included both over the counter or prescription medications.  Please review your pharmacy choice. Make sure the pharmacy is open so you can pick up prescription now. If there is a problem, you may contact your provider through [employer] and have the prescription routed to another pharmacy.  Your safety is important to [place]. If you have drug allergies check your prescription carefully.   For the next 24 hours you can use MyChart to ask questions about today's visit, request a non-urgent call back, or ask for a work or school excuse. You will get an email  in the next two days asking about your experience. I hope that your e-visit has been valuable and will speed your recovery.

## 2021-10-01 ENCOUNTER — Encounter: Payer: Self-pay | Admitting: Nurse Practitioner

## 2021-10-01 ENCOUNTER — Telehealth: Payer: 59 | Admitting: Nurse Practitioner

## 2021-10-01 DIAGNOSIS — R052 Subacute cough: Secondary | ICD-10-CM | POA: Diagnosis not present

## 2021-10-01 MED ORDER — PSEUDOEPH-BROMPHEN-DM 30-2-10 MG/5ML PO SYRP: 5.0000 mL | ORAL_SOLUTION | Freq: Four times a day (QID) | ORAL | 0 refills | Status: DC | PRN

## 2021-10-01 NOTE — Assessment & Plan Note (Signed)
Take meds as prescribed - Use a cool mist humidifier  -Use saline nose sprays frequently -Force fluids -For fever or aches or pains- take Tylenol or ibuprofen. -Bromfed 5 ml by mouth as needed, for cough, allergic rhinitis, cold symptoms. -If symptoms do not improve, she may need to be COVID tested to rule this out Follow up with worsening unresolved symptoms

## 2021-10-01 NOTE — Progress Notes (Signed)
   Virtual Visit  Note Due to COVID-19 pandemic this visit was conducted virtually. This visit type was conducted due to national recommendations for restrictions regarding the COVID-19 Pandemic (e.g. social distancing, sheltering in place) in an effort to limit this patient's exposure and mitigate transmission in our community. All issues noted in this document were discussed and addressed.  A physical exam was not performed with this format.  I connected with Summer [name] on 10/01/21 at 2 PM by telephone and verified that I am speaking with the correct person using two identifiers. Summer [name] is currently located at home during visit. The provider, Daryll Drown, NP is located in their office at time of visit.  I discussed the limitations, risks, security and privacy concerns of performing an evaluation and management service by telephone and the availability of in person appointments. I also discussed with the patient that there may be a patient responsible charge related to this service. The patient expressed understanding and agreed to proceed.   History and Present Illness:  Cough This is a recurrent problem. The current episode started in the past 7 days. The problem has been unchanged. The problem occurs constantly. The cough is Non-productive. Associated symptoms include nasal congestion. Pertinent negatives include no ear congestion, fever, rash or shortness of breath. Nothing aggravates the symptoms. She has tried oral steroids, OTC cough suppressant, OTC inhaler and prescription cough suppressant for the symptoms. The treatment provided mild relief.     Review of Systems  Constitutional:  Negative for fever.  Respiratory:  Positive for cough. Negative for shortness of breath.   Skin:  Negative for rash.    Observations/Objective: Televisit patient not in distress.  Assessment and Plan: Take meds as prescribed - Use a cool mist humidifier  -Use saline nose sprays  frequently -Force fluids -For fever or aches or pains- take Tylenol or ibuprofen. -Bromfed 5 ml by mouth as needed, for cough, allergic rhinitis, cold symptoms. -If symptoms do not improve, she may need to be COVID tested to rule this out Follow up with worsening unresolved symptoms   Follow Up Instructions: Follow-up with worsening unresolved symptoms.    I discussed the assessment and treatment plan with the patient. The patient was provided an opportunity to ask questions and all were answered. The patient agreed with the plan and demonstrated an understanding of the instructions.   The patient was advised to call back or seek an in-person evaluation if the symptoms worsen or if the condition fails to improve as anticipated.  The above assessment and management plan was discussed with the patient. The patient verbalized understanding of and has agreed to the management plan. Patient is aware to call the clinic if symptoms persist or worsen. Patient is aware when to return to the clinic for a follow-up visit. Patient educated on when it is appropriate to go to the emergency department.   Time call ended: 2:10 PM  I provided 10 minutes of  non face-to-face time during this encounter.    Daryll Drown, NP

## 2021-10-18 ENCOUNTER — Telehealth: Payer: 59 | Admitting: Nurse Practitioner

## 2021-10-18 DIAGNOSIS — J208 Acute bronchitis due to other specified organisms: Secondary | ICD-10-CM | POA: Diagnosis not present

## 2021-10-18 MED ORDER — PROMETHAZINE-DM 6.25-15 MG/5ML PO SYRP: 5.0000 mL | ORAL_SOLUTION | Freq: Four times a day (QID) | ORAL | 0 refills | Status: DC | PRN

## 2021-10-18 MED ORDER — PREDNISONE 10 MG PO TABS: 10.0000 mg | ORAL_TABLET | Freq: Every day | ORAL | 0 refills | Status: AC

## 2021-10-18 NOTE — Progress Notes (Signed)
We are sorry that you are not feeling well.  Here is how we plan to help!  Based on your presentation I believe you most likely have A cough due to a virus.  This is called viral bronchitis and is best treated by rest, plenty of fluids and control of the cough.  You may use Ibuprofen or Tylenol as directed to help your symptoms.     In addition you may use A prescription cough syrup called promethazine-DM  and Prednisone 10 mg daily for 6 days (see taper instructions below)  From your responses in the eVisit questionnaire you describe inflammation in the upper respiratory tract which is causing a significant cough.  This is commonly called Bronchitis and has four common causes:   Allergies Viral Infections Acid Reflux Bacterial Infection Allergies, viruses and acid reflux are treated by controlling symptoms or eliminating the cause. An example might be a cough caused by taking certain blood pressure medications. You stop the cough by changing the medication. Another example might be a cough caused by acid reflux. Controlling the reflux helps control the cough.  USE OF BRONCHODILATOR ("RESCUE") INHALERS: There is a risk from using your bronchodilator too frequently.  The risk is that over-reliance on a medication which only relaxes the muscles surrounding the breathing tubes can reduce the effectiveness of medications prescribed to reduce swelling and congestion of the tubes themselves.  Although you feel brief relief from the bronchodilator inhaler, your asthma may actually be worsening with the tubes becoming more swollen and filled with mucus.  This can delay other crucial treatments, such as oral steroid medications. If you need to use a bronchodilator inhaler daily, several times per day, you should discuss this with your provider.  There are probably better treatments that could be used to keep your asthma under control.     HOME CARE Only take medications as instructed by your medical  team. Complete the entire course of an antibiotic. Drink plenty of fluids and get plenty of rest. Avoid close contacts especially the very young and the elderly Cover your mouth if you cough or cough into your sleeve. Always remember to wash your hands A steam or ultrasonic humidifier can help congestion.   GET HELP RIGHT AWAY IF: You develop worsening fever. You become short of breath You cough up blood. Your symptoms persist after you have completed your treatment plan MAKE SURE YOU  Understand these instructions. Will watch your condition. Will get help right away if you are not doing well or get worse.    Thank you for choosing an e-visit.  Your e-visit answers were reviewed by a board certified advanced clinical practitioner to complete your personal care plan. Depending upon the condition, your plan could have included both over the counter or prescription medications.  Please review your pharmacy choice. Make sure the pharmacy is open so you can pick up prescription now. If there is a problem, you may contact your provider through [employer] and have the prescription routed to another pharmacy.  Your safety is important to [place]. If you have drug allergies check your prescription carefully.   For the next 24 hours you can use MyChart to ask questions about today's visit, request a non-urgent call back, or ask for a work or school excuse. You will get an email in the next two days asking about your experience. I hope that your e-visit has been valuable and will speed your recovery.

## 2021-10-18 NOTE — Progress Notes (Signed)
I have spent 5 minutes in review of e-visit questionnaire, review and updating patient chart, medical decision making and response to patient.  ° °Janica Eldred W Rhiann Boucher, NP ° °  °

## 2021-10-29 ENCOUNTER — Other Ambulatory Visit (HOSPITAL_COMMUNITY): Payer: Self-pay

## 2021-11-12 ENCOUNTER — Telehealth: Payer: 59 | Admitting: Physician Assistant

## 2021-11-12 DIAGNOSIS — J208 Acute bronchitis due to other specified organisms: Secondary | ICD-10-CM

## 2021-11-12 DIAGNOSIS — B9689 Other specified bacterial agents as the cause of diseases classified elsewhere: Secondary | ICD-10-CM

## 2021-11-12 MED ORDER — DOXYCYCLINE HYCLATE 100 MG PO TABS
100.0000 mg | ORAL_TABLET | Freq: Two times a day (BID) | ORAL | 0 refills | Status: DC
Start: 2021-11-12 — End: 2022-02-11

## 2021-11-12 MED ORDER — BENZONATATE 100 MG PO CAPS: 100.0000 mg | ORAL_CAPSULE | Freq: Three times a day (TID) | ORAL | 0 refills | Status: DC | PRN

## 2021-11-12 NOTE — Progress Notes (Signed)

## 2021-11-12 NOTE — Progress Notes (Signed)
I have spent 5 minutes in review of e-visit questionnaire, review and updating patient chart, medical decision making and response to patient.   Deni Berti Cody Aleesia Henney, PA-C    

## 2021-11-13 ENCOUNTER — Encounter: Payer: Self-pay | Admitting: Family

## 2021-11-13 ENCOUNTER — Ambulatory Visit (INDEPENDENT_AMBULATORY_CARE_PROVIDER_SITE_OTHER): Payer: 59 | Admitting: Family

## 2021-11-13 DIAGNOSIS — R6889 Other general symptoms and signs: Secondary | ICD-10-CM

## 2021-11-13 DIAGNOSIS — J4520 Mild intermittent asthma, uncomplicated: Secondary | ICD-10-CM

## 2021-11-13 MED ORDER — ONDANSETRON HCL 4 MG PO TABS: 4.0000 mg | ORAL_TABLET | Freq: Three times a day (TID) | ORAL | 0 refills | Status: DC | PRN

## 2021-11-13 MED ORDER — OSELTAMIVIR PHOSPHATE 75 MG PO CAPS: 75.0000 mg | ORAL_CAPSULE | Freq: Two times a day (BID) | ORAL | 0 refills | Status: DC

## 2021-11-13 MED ORDER — BENZONATATE 200 MG PO CAPS: 200.0000 mg | ORAL_CAPSULE | Freq: Three times a day (TID) | ORAL | 1 refills | Status: DC | PRN

## 2021-11-13 NOTE — Progress Notes (Signed)
Virtual Visit  Note Due to COVID-19 pandemic this visit was conducted virtually. This visit type was conducted due to national recommendations for restrictions regarding the COVID-19 Pandemic (e.g. social distancing, sheltering in place) in an effort to limit this patient's exposure and mitigate transmission in our community. All issues noted in this document were discussed and addressed.  A physical exam was not performed with this format.  I connected with Summer [name] on 11/13/21 at 9:47 AM  by telephone and verified that I am speaking with the correct person using two identifiers. Summer [name] is currently located at home and no one is currently with her during visit. The provider, Jannifer Rodney, FNP is located in their office at time of visit.  I discussed the limitations, risks, security and privacy concerns of performing an evaluation and management service by telephone and the availability of in person appointments. I also discussed with the patient that there may be a patient responsible charge related to this service. The patient expressed understanding and agreed to proceed.  Summer [name], Summer [name] are scheduled for a virtual visit with your provider today.    Just as we do with appointments in the office, we must obtain your consent to participate.  Your consent will be active for this visit and any virtual visit you may have with one of our providers in the next 365 days.    If you have a MyChart account, I can also send a copy of this consent to you electronically.  All virtual visits are billed to your insurance company just like a traditional visit in the office.  As this is a virtual visit, video technology does not allow for your provider to perform a traditional examination.  This may limit your provider's ability to fully assess your condition.  If your provider identifies any concerns that need to be evaluated in person or the need to arrange testing such as labs, EKG, etc, we  will make arrangements to do so.    Although advances in technology are sophisticated, we cannot ensure that it will always work on either your end or our end.  If the connection with a video visit is poor, we may have to switch to a telephone visit.  With either a video or telephone visit, we are not always able to ensure that we have a secure connection.   I need to obtain your verbal consent now.   Are you willing to proceed with your visit today?   Summer [name] has provided verbal consent on 11/13/2021 for a virtual visit (video or telephone).   Jannifer Rodney, [place] 11/13/2021  9:46 AM    History and Present Illness:  Pt calls the office today with flu like symptoms that started yesterday. States she was sent home from work yesterday with fever.  Cough This is a new problem. The current episode started yesterday. The problem has been gradually worsening. The problem occurs every few minutes. The cough is Productive of sputum. Associated symptoms include chills, a fever, headaches, myalgias, nasal congestion, postnasal drip, shortness of breath and wheezing. Pertinent negatives include no ear congestion or ear pain. She has tried OTC inhaler and rest for the symptoms. The treatment provided mild relief.     Review of Systems  Constitutional:  Positive for chills and fever.  HENT:  Positive for postnasal drip. Negative for ear pain.   Respiratory:  Positive for cough, shortness of breath and wheezing.   Musculoskeletal:  Positive for myalgias.  Neurological:  Positive for headaches.    Observations/Objective: No SOB or distress noted   Assessment and Plan: 1. Flu-like symptoms Rest, force fluids, tylenol as needed, report any worsening symptoms such as increased shortness of breath, swelling, or continued high fevers. Possible adverse effects discussed with antivirals.  - oseltamivir (TAMIFLU) 75 MG capsule; Take 1 capsule (75 mg total) by mouth 2 (two) times daily.  Dispense: 10  capsule; Refill: 0 - benzonatate (TESSALON) 200 MG capsule; Take 1 capsule (200 mg total) by mouth 3 (three) times daily as needed.  Dispense: 30 capsule; Refill: 1 - ondansetron (ZOFRAN) 4 MG tablet; Take 1 tablet (4 mg total) by mouth every 8 (eight) hours as needed for nausea or vomiting.  Dispense: 20 tablet; Refill: 0  2. Mild intermittent asthma without complication - oseltamivir (TAMIFLU) 75 MG capsule; Take 1 capsule (75 mg total) by mouth 2 (two) times daily.  Dispense: 10 capsule; Refill: 0     I discussed the assessment and treatment plan with the patient. The patient was provided an opportunity to ask questions and all were answered. The patient agreed with the plan and demonstrated an understanding of the instructions.   The patient was advised to call back or seek an in-person evaluation if the symptoms worsen or if the condition fails to improve as anticipated.  The above assessment and management plan was discussed with the patient. The patient verbalized understanding of and has agreed to the management plan. Patient is aware to call the clinic if symptoms persist or worsen. Patient is aware when to return to the clinic for a follow-up visit. Patient educated on when it is appropriate to go to the emergency department.   Time call ended:  9:58 AM   I provided 11 minutes of  non face-to-face time during this encounter.    Jannifer Rodney, FNP

## 2021-11-13 NOTE — Patient Instructions (Signed)
Influenza, Adult °Influenza, also called "the flu," is a viral infection that mainly affects the respiratory tract. This includes the lungs, nose, and throat. The flu spreads easily from person to person (is contagious). It causes common cold symptoms, along with high fever and body aches. °What are the causes? °This condition is caused by the influenza virus. You can get the virus by: °Breathing in droplets that are in the air from an infected person's cough or sneeze. °Touching something that has the virus on it (has been contaminated) and then touching your mouth, nose, or eyes. °What increases the risk? °The following factors may make you more likely to get the flu: °Not washing or sanitizing your hands often. °Having close contact with many people during cold and flu season. °Touching your mouth, eyes, or nose without first washing or sanitizing your hands. °Not getting an annual flu shot. °You may have a higher risk for the flu, including serious problems, such as a lung infection (pneumonia), if you: °Are older than 65. °Are pregnant. °Have a weakened disease-fighting system (immune system). This includes people who have HIV or AIDS, are on chemotherapy, or are taking medicines that reduce (suppress) the immune system. °Have a long-term (chronic) illness, such as heart disease, kidney disease, diabetes, or lung disease. °Have a liver disorder. °Are severely overweight (morbidly obese). °Have anemia. °Have asthma. °What are the signs or symptoms? °Symptoms of this condition usually begin suddenly and last 4-14 days. These may include: °Fever and chills. °Headaches, body aches, or muscle aches. °Sore throat. °Cough. °Runny or stuffy (congested) nose. °Chest discomfort. °Poor appetite. °Weakness or fatigue. °Dizziness. °Nausea or vomiting. °How is this diagnosed? °This condition may be diagnosed based on: °Your symptoms and medical history. °A physical exam. °Swabbing your nose or throat and testing the fluid  for the influenza virus. °How is this treated? °If the flu is diagnosed early, you can be treated with antiviral medicine that is given by mouth (orally) or through an IV. This can help reduce how severe the illness is and how long it lasts. °Taking care of yourself at home can help relieve symptoms. Your health care provider may recommend: °Taking over-the-counter medicines. °Drinking plenty of fluids. °In many cases, the flu goes away on its own. If you have severe symptoms or complications, you may be treated in a hospital. °Follow these instructions at home: °Activity °Rest as needed and get plenty of sleep. °Stay home from work or school as told by your health care provider. Unless you are visiting your health care provider, avoid leaving home until your fever has been gone for 24 hours without taking medicine. °Eating and drinking °Take an oral rehydration solution (ORS). This is a drink that is sold at pharmacies and retail stores. °Drink enough fluid to keep your urine pale yellow. °Drink clear fluids in small amounts as you are able. Clear fluids include water, ice chips, fruit juice mixed with water, and low-calorie sports drinks. °Eat bland, easy-to-digest foods in small amounts as you are able. These foods include bananas, applesauce, rice, lean meats, toast, and crackers. °Avoid drinking fluids that contain a lot of sugar or caffeine, such as energy drinks, regular sports drinks, and soda. °Avoid alcohol. °Avoid spicy or fatty foods. °General instructions °  °Take over-the-counter and prescription medicines only as told by your health care provider. °Use a cool mist humidifier to add humidity to the air in your home. This can make it easier to breathe. °When using a cool mist humidifier,   clean it daily. Empty the water and replace it with clean water. °Cover your mouth and nose when you cough or sneeze. °Wash your hands with soap and water often and for at least 20 seconds, especially after you cough or  sneeze. If soap and water are not available, use alcohol-based hand sanitizer. °Keep all follow-up visits. This is important. °How is this prevented? ° °Get an annual flu shot. This is usually available in late summer, fall, or winter. Ask your health care provider when you should get your flu shot. °Avoid contact with people who are sick during cold and flu season. This is generally fall and winter. °Contact a health care provider if: °You develop new symptoms. °You have: °Chest pain. °Diarrhea. °A fever. °Your cough gets worse. °You produce more mucus. °You feel nauseous or you vomit. °Get help right away if you: °Develop shortness of breath or have difficulty breathing. °Have skin or nails that turn a bluish color. °Have severe pain or stiffness in your neck. °Develop a sudden headache or sudden pain in your face or ear. °Cannot eat or drink without vomiting. °These symptoms may represent a serious problem that is an emergency. Do not wait to see if the symptoms will go away. Get medical help right away. Call your local emergency services (911 in the [place] Do not drive yourself to the hospital. °Summary °Influenza, also called "the flu," is a viral infection that primarily affects your respiratory tract. °Symptoms of the flu usually begin suddenly and last 4-14 days. °Getting an annual flu shot is the best way to prevent getting the flu. °Stay home from work or school as told by your health care provider. Unless you are visiting your health care provider, avoid leaving home until your fever has been gone for 24 hours without taking medicine. °Keep all follow-up visits. This is important. °This information is not intended to replace advice given to you by your health care provider. Make sure you discuss any questions you have with your health care provider. °Document Revised: 06/14/2020 Document Reviewed: 06/14/2020 °Elsevier Patient Education © 2022 [employer] ° °

## 2021-12-26 ENCOUNTER — Telehealth: Payer: 59 | Admitting: Emergency Medicine

## 2021-12-26 DIAGNOSIS — N760 Acute vaginitis: Secondary | ICD-10-CM

## 2021-12-26 MED ORDER — FLUCONAZOLE 150 MG PO TABS: 150.0000 mg | ORAL_TABLET | Freq: Once | ORAL | 0 refills | Status: AC

## 2021-12-26 NOTE — Progress Notes (Signed)

## 2021-12-30 ENCOUNTER — Other Ambulatory Visit (HOSPITAL_COMMUNITY): Payer: Self-pay

## 2021-12-30 ENCOUNTER — Telehealth: Payer: Self-pay | Admitting: Family Medicine

## 2021-12-30 ENCOUNTER — Other Ambulatory Visit: Payer: Self-pay | Admitting: Family Medicine

## 2021-12-30 DIAGNOSIS — K219 Gastro-esophageal reflux disease without esophagitis: Secondary | ICD-10-CM

## 2021-12-30 DIAGNOSIS — G43109 Migraine with aura, not intractable, without status migrainosus: Secondary | ICD-10-CM

## 2021-12-30 NOTE — Telephone Encounter (Signed)
°  Prescription Request  12/30/2021  Is this a "Controlled Substance" medicine? Not sure  Have you seen your PCP in the last 2 weeks? no  If YES, route message to pool  -  If NO, patient needs to be scheduled for appointment.  What is the name of the medication or equipment? Ibuprofen 800mg   Have you contacted your pharmacy to request a refill? no   Which pharmacy would you like this sent to? [facility]   Patient notified that their request is being sent to the clinical staff for review and that they should receive a response within 2 business days.    Joyce's pt.  She is having migraines & she needs her meds.

## 2021-12-31 ENCOUNTER — Other Ambulatory Visit (HOSPITAL_COMMUNITY): Payer: Self-pay

## 2021-12-31 MED ORDER — IBUPROFEN 800 MG PO TABS
800.0000 mg | ORAL_TABLET | Freq: Three times a day (TID) | ORAL | 2 refills | Status: DC | PRN
  Filled 2021-12-31 – 2022-01-22 (×2): qty 30, 10d supply, fill #0
  Filled 2022-02-21: qty 30, 10d supply, fill #1

## 2021-12-31 NOTE — Telephone Encounter (Signed)
Patient aware and verbalizes understanding. 

## 2021-12-31 NOTE — Telephone Encounter (Signed)
Ibuprofen 800 mg not on current med list.  Prescribed in past, last refill 06/15/19, #60, 1 refill.  Last office visit 07/03/21, instructed to return in 6 months, no appointment on file.

## 2021-12-31 NOTE — Telephone Encounter (Signed)
Ibuprofen sent to [facility].

## 2022-01-08 ENCOUNTER — Other Ambulatory Visit (HOSPITAL_COMMUNITY): Payer: Self-pay

## 2022-01-22 ENCOUNTER — Other Ambulatory Visit (HOSPITAL_COMMUNITY): Payer: Self-pay

## 2022-01-22 ENCOUNTER — Other Ambulatory Visit: Payer: Self-pay | Admitting: Family Medicine

## 2022-01-22 MED ORDER — OMEPRAZOLE 20 MG PO CPDR
20.0000 mg | DELAYED_RELEASE_CAPSULE | Freq: Every day | ORAL | 0 refills | Status: DC
  Filled 2022-01-22: qty 30, 30d supply, fill #0

## 2022-01-30 ENCOUNTER — Other Ambulatory Visit (HOSPITAL_COMMUNITY): Payer: Self-pay

## 2022-02-11 ENCOUNTER — Ambulatory Visit (INDEPENDENT_AMBULATORY_CARE_PROVIDER_SITE_OTHER): Payer: 59 | Admitting: Family Medicine

## 2022-02-11 ENCOUNTER — Encounter: Payer: Self-pay | Admitting: Family Medicine

## 2022-02-11 VITALS — BP 100/65 | HR 77 | Temp 98.1°F | Resp 16 | Ht 63.0 in | Wt 242.6 lb

## 2022-02-11 DIAGNOSIS — H1031 Unspecified acute conjunctivitis, right eye: Secondary | ICD-10-CM

## 2022-02-11 DIAGNOSIS — M62830 Muscle spasm of back: Secondary | ICD-10-CM | POA: Diagnosis not present

## 2022-02-11 MED ORDER — POLYMYXIN B-TRIMETHOPRIM 10000-0.1 UNIT/ML-% OP SOLN: 2.0000 [drp] | Freq: Four times a day (QID) | OPHTHALMIC | 0 refills | Status: DC

## 2022-02-11 MED ORDER — CYCLOBENZAPRINE HCL 10 MG PO TABS
10.0000 mg | ORAL_TABLET | Freq: Three times a day (TID) | ORAL | 0 refills | Status: DC | PRN
Start: 2022-02-11 — End: 2022-05-22

## 2022-02-11 MED ORDER — METHYLPREDNISOLONE ACETATE 80 MG/ML IJ SUSP
80.0000 mg | Freq: Once | INTRAMUSCULAR | Status: AC
  Administered 2022-02-11: 80 mg via INTRAMUSCULAR

## 2022-02-11 NOTE — Progress Notes (Signed)
?  ? ?Subjective:  ?Patient ID: Summer [name], female    DOB: [birth year], 46 y.o.   MRN: [id number] ? ?Patient Care Team: ?Gwenlyn Fudge, FNP as PCP - General (Family Medicine)  ? ?Chief Complaint:  Back Pain and Eye Pain ? ? ?HPI: ?Summer [name] is a 46 y.o. female presenting on 02/11/2022 for Back Pain and Eye Pain ? ? ?Pt presents today with complaints of back pain and right eye drainage. 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 States she has been taking motrin and using topicals with some relief of the pain. Her granddaughter has pinkeye and she kept her a few days ago. States this morning her eye was matted shut and now is red and irritated.  ? ?Back Pain ?This is a new problem. The current episode started in the past 7 days. The problem has been waxing and waning since onset. The pain is present in the lumbar spine and thoracic spine. The quality of the pain is described as aching and shooting. The pain does not radiate. The symptoms are aggravated by bending, position and twisting. Pertinent negatives include no abdominal pain, bladder incontinence, bowel incontinence, chest pain, dysuria, fever, headaches, leg pain, numbness, paresis, paresthesias, pelvic pain, perianal numbness, tingling, weakness or weight loss. She has tried analgesics and NSAIDs for the symptoms. The treatment provided mild relief.  ?Eye Pain  ?The right eye is affected. This is a new problem. The current episode started yesterday. There was no injury mechanism. There is Known exposure to pink eye. She Does not wear contacts. Associated symptoms include an eye discharge, eye redness and itching. Pertinent negatives include no blurred vision, double vision, fever, foreign body sensation, nausea, photophobia, recent URI, tingling, vomiting or weakness. She has tried nothing for the symptoms.  ? ? ?Relevant past medical, surgical, family, and social history reviewed and updated as  indicated.  ?Allergies and medications reviewed and updated. Data reviewed: Chart in Epic. ? ? ?Past Medical History:  ?Diagnosis Date  ? Anxiety   ? Asthma   ? GERD (gastroesophageal reflux disease)   ? Mixed hyperlipidemia 03/27/2021  ? ? ?Past Surgical History:  ?Procedure Laterality Date  ? CHOLECYSTECTOMY N/A 02/21/2014  ? Procedure: LAPAROSCOPIC CHOLECYSTECTOMY;  Surgeon: Dalia Heading, MD;  Location: [facility];  Service: General;  Laterality: N/A;  ? GALLBLADDER SURGERY    ? TUBAL LIGATION    ? TYMPANOSTOMY TUBE PLACEMENT Bilateral   ? ? ?Social History  ? ?Socioeconomic History  ? Marital status: Married  ?  Spouse name: Not on file  ? Number of children: Not on file  ? Years of education: Not on file  ? Highest education level: Not on file  ?Occupational History  ? Not on file  ?Tobacco Use  ? Smoking status: Former  ?  Packs/day: 0.50  ?  Years: 3.00  ?  Pack years: 1.50  ?  Types: Cigarettes  ?  Quit date: 02/20/1998  ?  Years since quitting: 23.9  ? Smokeless tobacco: Never  ?Vaping Use  ? Vaping Use: Never used  ?Substance and Sexual Activity  ? Alcohol use: No  ? Drug use: No  ? Sexual activity: Yes  ?  Birth control/protection: Surgical  ?Other Topics Concern  ? Not on file  ?Social History Narrative  ? Not on file  ? ?Social Determinants of Health  ? ?Financial Resource Strain: Not on file  ?Food Insecurity:  Not on file  ?Transportation Needs: Not on file  ?Physical Activity: Not on file  ?Stress: Not on file  ?Social Connections: Not on file  ?Intimate Partner Violence: Not on file  ? ? ?Outpatient Encounter Medications as of 02/11/2022  ?Medication Sig  ? albuterol (VENTOLIN HFA) 108 (90 Base) MCG/ACT inhaler Inhale 2 puffs into the lungs every 6 (six) hours as needed.  ? cyclobenzaprine (FLEXERIL) 10 MG tablet Take 1 tablet (10 mg total) by mouth 3 (three) times daily as needed for muscle spasms.  ? FLUoxetine (PROZAC) 20 MG capsule Take 1 capsule (20 mg total) by mouth daily.  ? fluticasone (FLONASE)  50 MCG/ACT nasal spray Place 2 sprays into both nostrils daily.  ? ibuprofen (ADVIL) 800 MG tablet Take 1 tablet (800 mg total) by mouth every 8 (eight) hours as needed for headache.  ? levothyroxine (SYNTHROID) 50 MCG tablet Take 1 tablet (50 mcg total) by mouth daily.  ? loratadine (CLARITIN) 10 MG tablet Take 1 tablet (10 mg total) by mouth daily.  ? metoprolol tartrate (LOPRESSOR) 25 MG tablet Take 1/2 tablet (12.5 mg total) by mouth 2 times daily as needed (high heart rate).  ? montelukast (SINGULAIR) 10 MG tablet Take 1 tablet (10 mg total) by mouth at bedtime.  ? omeprazole (PRILOSEC) 20 MG capsule Take 1 capsule (20 mg total) by mouth daily. (NEEDS TO BE SEEN BEFORE NEXT REFILL)  ? ondansetron (ZOFRAN) 4 MG tablet Take 1 tablet (4 mg total) by mouth every 8 (eight) hours as needed for nausea or vomiting.  ? SUMAtriptan (IMITREX) 50 MG tablet Take 1 tablet (50 mg total) by mouth every 2 (two) hours as needed for migraine. May repeat in 2 hours if headache persists or recurs.  ? topiramate (TOPAMAX) 50 MG tablet Take 1 tablet (50 mg total) by mouth 2 (two) times daily.  ? trimethoprim-polymyxin b (POLYTRIM) ophthalmic solution Place 2 drops into both eyes in the morning, at noon, in the evening, and at bedtime.  ? valACYclovir (VALTREX) 1000 MG tablet Take 2 tablets (2,000 mg) by mouth every 12 hours x1 day at onset of symptoms of a cold sore. Use as needed.  ? [DISCONTINUED] azelastine (ASTELIN) 0.1 % nasal spray Place 1 spray into both nostrils 2 (two) times daily. Use in each nostril as directed  ? [DISCONTINUED] benzonatate (TESSALON) 200 MG capsule Take 1 capsule (200 mg total) by mouth 3 (three) times daily as needed.  ? [DISCONTINUED] cyclobenzaprine (FLEXERIL) 10 MG tablet TAKE 1 TABLET BY MOUTH 3 TIMES DAILY AS NEEDED FOR MUSCLE SPASMS.  ? [DISCONTINUED] doxycycline (VIBRA-TABS) 100 MG tablet Take 1 tablet (100 mg total) by mouth 2 (two) times daily.  ? [DISCONTINUED] oseltamivir (TAMIFLU) 75 MG  capsule Take 1 capsule (75 mg total) by mouth 2 (two) times daily.  ? [EXPIRED] methylPREDNISolone acetate (DEPO-MEDROL) injection 80 mg   ? ?No facility-administered encounter medications on file as of 02/11/2022.  ? ? ?Allergies  ?Allergen Reactions  ? Tramadol   ?  Vomiting and nausea  ? ? ?Review of Systems  ?Constitutional:  Negative for fever and weight loss.  ?Eyes:  Positive for pain, discharge, redness and itching. Negative for blurred vision, double vision and photophobia.  ?Cardiovascular:  Negative for chest pain.  ?Gastrointestinal:  Negative for abdominal pain, bowel incontinence, nausea and vomiting.  ?Genitourinary:  Negative for bladder incontinence, dysuria and pelvic pain.  ?Musculoskeletal:  Positive for back pain.  ?Neurological:  Negative for tingling, weakness, numbness, headaches and  paresthesias.  ? ?   ? ?Objective:  ?BP 100/65   Pulse 77   Temp 98.1 ?F (36.7 ?C)   Resp 16   Ht 5\' 3"  (1.6 m)   Wt 242 lb 9.6 oz (110 kg)   SpO2 98%   BMI 42.97 kg/m?   ? ?Wt Readings from Last 3 Encounters:  ?02/11/22 242 lb 9.6 oz (110 kg)  ?07/03/21 246 lb 9.6 oz (111.9 kg)  ?05/07/21 252 lb 6.4 oz (114.5 kg)  ? ? ?Physical Exam ?Vitals and nursing note reviewed.  ?Constitutional:   ?   General: She is not in acute distress. ?   Appearance: Normal appearance. She is well-developed and well-groomed. She is obese. She is not ill-appearing, toxic-appearing or diaphoretic.  ?HENT:  ?   Head: Normocephalic and atraumatic.  ?   Jaw: There is normal jaw occlusion.  ?   Right Ear: Hearing normal.  ?   Left Ear: Hearing normal.  ?   Nose: Nose normal.  ?   Mouth/Throat:  ?   Lips: Pink.  ?   Mouth: Mucous membranes are moist.  ?   Pharynx: Oropharynx is clear. Uvula midline.  ?Eyes:  ?   General: Lids are normal.     ?   Right eye: Discharge (yellow) present. No foreign body or hordeolum.  ?   Extraocular Movements: Extraocular movements intact.  ?   Conjunctiva/sclera:  ?   Right eye: Right conjunctiva is  injected. Exudate present. No chemosis or hemorrhage. ?   Left eye: Left conjunctiva is not injected. No chemosis, exudate or hemorrhage. ?   Pupils: Pupils are equal, round, and reactive to light.  ?Neck:  ?   Thyroid: No thyr

## 2022-02-16 ENCOUNTER — Telehealth: Payer: 59 | Admitting: Physician Assistant

## 2022-02-16 DIAGNOSIS — B9789 Other viral agents as the cause of diseases classified elsewhere: Secondary | ICD-10-CM | POA: Diagnosis not present

## 2022-02-16 DIAGNOSIS — J019 Acute sinusitis, unspecified: Secondary | ICD-10-CM

## 2022-02-16 MED ORDER — IPRATROPIUM BROMIDE 0.03 % NA SOLN: 2.0000 | Freq: Two times a day (BID) | NASAL | 0 refills | Status: DC

## 2022-02-16 NOTE — Progress Notes (Signed)

## 2022-02-21 ENCOUNTER — Other Ambulatory Visit (HOSPITAL_COMMUNITY): Payer: Self-pay

## 2022-02-21 ENCOUNTER — Other Ambulatory Visit: Payer: Self-pay | Admitting: Family Medicine

## 2022-02-21 DIAGNOSIS — F411 Generalized anxiety disorder: Secondary | ICD-10-CM

## 2022-02-21 DIAGNOSIS — J302 Other seasonal allergic rhinitis: Secondary | ICD-10-CM

## 2022-02-21 DIAGNOSIS — G43109 Migraine with aura, not intractable, without status migrainosus: Secondary | ICD-10-CM

## 2022-02-23 ENCOUNTER — Other Ambulatory Visit (HOSPITAL_COMMUNITY): Payer: Self-pay

## 2022-02-23 MED ORDER — TOPIRAMATE 50 MG PO TABS
50.0000 mg | ORAL_TABLET | Freq: Two times a day (BID) | ORAL | 0 refills | Status: DC
  Filled 2022-02-23: qty 60, 30d supply, fill #0

## 2022-02-23 MED ORDER — MONTELUKAST SODIUM 10 MG PO TABS
10.0000 mg | ORAL_TABLET | Freq: Every day | ORAL | 0 refills | Status: DC
  Filled 2022-02-23: qty 90, 90d supply, fill #0

## 2022-02-23 MED ORDER — FLUOXETINE HCL 20 MG PO CAPS
20.0000 mg | ORAL_CAPSULE | Freq: Every day | ORAL | 0 refills | Status: DC
  Filled 2022-02-23: qty 30, 30d supply, fill #0

## 2022-03-02 ENCOUNTER — Other Ambulatory Visit (HOSPITAL_COMMUNITY): Payer: Self-pay

## 2022-03-23 ENCOUNTER — Ambulatory Visit: Payer: 59 | Admitting: Allergy & Immunology

## 2022-03-23 ENCOUNTER — Encounter: Payer: Self-pay | Admitting: Allergy & Immunology

## 2022-03-23 ENCOUNTER — Other Ambulatory Visit: Payer: Self-pay

## 2022-03-23 ENCOUNTER — Other Ambulatory Visit (HOSPITAL_COMMUNITY): Payer: Self-pay

## 2022-03-23 VITALS — BP 126/72 | HR 81 | Temp 98.4°F | Ht 62.0 in | Wt 245.4 lb

## 2022-03-23 DIAGNOSIS — J4520 Mild intermittent asthma, uncomplicated: Secondary | ICD-10-CM

## 2022-03-23 DIAGNOSIS — J302 Other seasonal allergic rhinitis: Secondary | ICD-10-CM

## 2022-03-23 DIAGNOSIS — J310 Chronic rhinitis: Secondary | ICD-10-CM

## 2022-03-23 MED ORDER — RYALTRIS 665-25 MCG/ACT NA SUSP: 1.0000 | Freq: Two times a day (BID) | NASAL | 5 refills | Status: DC

## 2022-03-23 MED ORDER — LEVOCETIRIZINE DIHYDROCHLORIDE 5 MG PO TABS
5.0000 mg | ORAL_TABLET | Freq: Every evening | ORAL | 5 refills | Status: DC
  Filled 2022-03-23: qty 30, 30d supply, fill #0
  Filled 2022-05-15: qty 30, 30d supply, fill #1
  Filled 2022-09-10: qty 30, 30d supply, fill #2
  Filled 2022-11-13: qty 30, 30d supply, fill #3

## 2022-03-23 NOTE — Patient Instructions (Addendum)
1. Chronic rhinitis ?- Testing today showed: trees, indoor molds, dust mites, and cockroach. ?- Copy of test results provided.  ?- Avoidance measures provided. ?- Stop taking: your current nose spray and loratadine ?- Continue with: Singulair ?- Start taking: Xyzal (levocetirizine) 5mg  tablet once daily and Ryaltris (olopatadine/mometasone) two sprays per nostril 1-2 times daily as needed ?- You can use an extra dose of the antihistamine, if needed, for breakthrough symptoms.  ?- Consider nasal saline rinses 1-2 times daily to remove allergens from the nasal cavities as well as help with mucous clearance (this is especially helpful to do before the nasal sprays are given) ?- Consider allergy shots as a means of long-term control. ?- Allergy shots "re-train" and "reset" the immune system to ignore environmental allergens and decrease the resulting immune response to those allergens (sneezing, itchy watery eyes, runny nose, nasal congestion, etc).    ?- Allergy shots improve symptoms in 75-85% of patients.  ?- We can discuss more at the next appointment if the medications are not working for you. ? ?2. Mild intermittent asthma, uncomplicated ?- It seems that this is not too much of a problem. ?- I do not think that we need a controller medication at this time. ?- Continue with albuterol 2 puffs every 4-6 hours as needed. ? ?3. No follow-ups on file.  ? ? ?Please inform us of any Emergency Department visits, hospitalizations, or changes in symptoms. Call us before going to the ED for breathing or allergy symptoms since we might be able to fit you in for a sick visit. Feel free to contact us anytime with any questions, problems, or concerns. ? ?It was a pleasure to meet you today! ? ?Websites that have reliable patient information: ?1. American Academy of Asthma, Allergy, and Immunology: www.aaaai.org ?2. Food Allergy Research and Education (FARE): foodallergy.org ?3. Mothers of Asthmatics:  http://www.asthmacommunitynetwork.org ?4. [employer] of Allergy, Asthma, and Immunology: MonthlyElectricBill.co.uk ? ? ?COVID-19 Vaccine Information can be found at: ShippingScam.co.uk For questions related to vaccine distribution or appointments, please email [email or web address] or call [phone].  ? ?We realize that you might be concerned about having an allergic reaction to the COVID19 vaccines. To help with that concern, WE ARE OFFERING THE COVID19 VACCINES IN OUR OFFICE! Ask the front desk for dates!  ? ? ? ??Like? [place] on Facebook and Instagram for our latest updates!  ?  ? ? ?A healthy democracy works best when [employer] participate! Make sure you are registered to vote! If you have moved or changed any of your contact information, you will need to get this updated before voting! ? ?In some cases, you MAY be able to register to vote online: [email or web address] ? ? ? ? ? ? ? Airborne Adult Perc - 03/23/22 1437   ? ? Time Antigen Placed [id number]   ? Allergen Manufacturer Lavella Hammock   ? Location Back   ? Number of Test 59   ? 1. Control-Buffer 50% Glycerol Negative   ? 2. Control-Histamine 1 mg/ml 2+   ? 3. Albumin saline Negative   ? 4. Point Lay Negative   ? 5. [place] Negative   ? 6. Johnson Negative   ? 7. [place] Blue Negative   ? 8. Meadow Fescue Negative   ? 9. Perennial Rye Negative   ? 10. Sweet Vernal Negative   ? 11. Timothy Negative   ? 12. Cocklebur Negative   ? 13. Burweed Marshelder Negative   ? 14. Ragweed, short Negative   ? 15. Ragweed,  Giant Negative   ? 16. Plantain,  English Negative   ? 17. Lamb's Quarters Negative   ? 18. Sheep Sorrell Negative   ? 19. Rough Pigweed Negative   ? 20. Marsh Elder, Rough Negative   ? 21. Mugwort, Common Negative   ? 22. Ash mix Negative   ? 23. Wendee Copp mix Negative   ? 24. Beech American Negative   ? 25. Box, Elder Negative   ? 26. Cedar, red Negative   ? 27. Cottonwood, [place] Negative    ? 28. Elm mix Negative   ? 29. Hickory Negative   ? 30. Maple mix Negative   ? 31. Oak, [place] mix Negative   ? 32. Pecan Pollen Negative   ? 33. Pine mix Negative   ? 34. Sycamore Eastern Negative   ? 35. Walnut, Black Pollen Negative   ? 36. Alternaria alternata Negative   ? 14. Cladosporium Herbarum Negative   ? 38. Aspergillus mix Negative   ? 39. Penicillium mix Negative   ? 40. Bipolaris sorokiniana (Helminthosporium) Negative   ? 41. Drechslera spicifera (Curvularia) Negative   ? 42. Mucor plumbeus Negative   ? 43. Fusarium moniliforme Negative   ? 44. Aureobasidium pullulans (pullulara) Negative   ? 45. Rhizopus oryzae Negative   ? 46. Botrytis cinera Negative   ? 47. Epicoccum nigrum Negative   ? 48. Phoma betae Negative   ? 49. Candida Albicans Negative   ? 50. Trichophyton mentagrophytes 2+   ? 51. Mite, D Farinae  5,000 AU/ml 2+   ? 52. Mite, D Pteronyssinus  5,000 AU/ml 2+   ? 53. Cat Hair 10,000 BAU/ml Negative   ? 54.  Dog Epithelia Negative   ? 55. Mixed Feathers Negative   ? 56. Horse Epithelia Negative   ? 57. Cockroach, [place] Negative   ? 58. Mouse Negative   ? 59. Tobacco Leaf Negative   ? ?  ?  ? ?  ? ? Intradermal - 03/23/22 1437   ? ? Time Antigen Placed [id number]   ? Allergen Manufacturer Lavella Hammock   ? Location Arm   ? Number of Test 14   ? Intradermal Select   ? Control Negative   ? [place] Negative   ? Johnson Negative   ? 7 Grass Negative   ? Ragweed mix Negative   ? Weed mix Negative   ? Tree mix 2+   ? Mold 1 Negative   ? Mold 2 Negative   ? Mold 3 Negative   ? Mold 4 2+   ? Cat Negative   ? Dog Negative   ? Cockroach 2+   ? ?  ?  ? ?  ? ? ?Reducing Pollen Exposure ? ?The American Academy of Allergy, Asthma and Immunology suggests the following steps to reduce your exposure to pollen during allergy seasons. ?   ?Do not hang sheets or clothing out to dry; pollen may collect on these items. ?Do not mow lawns or spend time around freshly cut grass; mowing stirs up pollen. ?Keep windows closed at  night.  Keep car windows closed while driving. ?Minimize morning activities outdoors, a time when pollen counts are usually at their highest. ?Stay indoors as much as possible when pollen counts or humidity is high and on windy days when pollen tends to remain in the air longer. ?Use air conditioning when possible.  Many air conditioners have filters that trap the pollen spores. ?Use a HEPA room air filter to remove pollen  form the indoor air you breathe. ? ?Control of Mold Allergen  ? ?Mold and fungi can grow on a variety of surfaces provided certain temperature and moisture conditions exist.  Outdoor molds grow on plants, decaying vegetation and soil.  The major outdoor mold, Alternaria and Cladosporium, are found in very high numbers during hot and dry conditions.  Generally, a late Summer - Fall peak is seen for common outdoor fungal spores.  Rain will temporarily lower outdoor mold spore count, but counts rise rapidly when the rainy period ends.  The most important indoor molds are Aspergillus and Penicillium.  Dark, humid and poorly ventilated basements are ideal sites for mold growth.  The next most common sites of mold growth are the bathroom and the kitchen. ? ?Indoor (Perennial) Mold Control  ? ?Positive indoor molds via skin testing: Fusarium, Aureobasidium (Pullulara), and Rhizopus ? ?Maintain humidity below 50%. ?Clean washable surfaces with 5% bleach solution. ?Remove sources e.g. contaminated carpets. ? ? ? ?Control of Dust Mite Allergen ? ? ? ?Dust mites play a major role in allergic asthma and rhinitis.  They occur in environments with high humidity wherever human skin is found.  Dust mites absorb humidity from the atmosphere (ie, they do not drink) and feed on organic matter (including shed human and animal skin).  Dust mites are a microscopic type of insect that you cannot see with the naked eye.  High levels of dust mites have been detected from mattresses, pillows, carpets, upholstered  furniture, bed covers, clothes, soft toys and any woven material.  The principal allergen of the dust mite is found in its feces.  A gram of dust may contain 1,000 mites and 250,000 fecal particles.  Mite antigen is easily measured in t

## 2022-03-23 NOTE — Progress Notes (Signed)
? ?NEW PATIENT ? ?Date of Service/Encounter:  03/23/22 ? ?Consult requested by: Summer Fudge, FNP ? ? ?Assessment:  ? ?Mild intermittent asthma, uncomplicated ? ?Perennial and seasonal allergic rhinitis (trees, indoor molds, dust mites, and cockroach) ? ?Migraines ? ?Hypothyroidism - on Synthroid ? ?Plan/Recommendations:  ? ? ?1. Chronic rhinitis ?- Testing today showed: trees, indoor molds, dust mites, and cockroach. ?- Copy of test results provided.  ?- Avoidance measures provided. ?- Stop taking: your current nose spray and loratadine ?- Continue with: Singulair ?- Start taking: Xyzal (levocetirizine) 5mg  tablet once daily and Ryaltris (olopatadine/mometasone) two sprays per nostril 1-2 times daily as needed ?- You can use an extra dose of the antihistamine, if needed, for breakthrough symptoms.  ?- Consider nasal saline rinses 1-2 times daily to remove allergens from the nasal cavities as well as help with mucous clearance (this is especially helpful to do before the nasal sprays are given) ?- Consider allergy shots as a means of long-term control. ?- Allergy shots "re-train" and "reset" the immune system to ignore environmental allergens and decrease the resulting immune response to those allergens (sneezing, itchy watery eyes, runny nose, nasal congestion, etc).    ?- Allergy shots improve symptoms in 75-85% of patients.  ?- We can discuss more at the next appointment if the medications are not working for you. ? ?2. Mild intermittent asthma, uncomplicated ?- It seems that this is not too much of a problem. ?- I do not think that we need a controller medication at this time. ?- Continue with albuterol 2 puffs every 4-6 hours as needed. ? ?3. Follow up in 6 weeks or earlier if needed.  ? ? ?This note in its entirety was forwarded to the Provider who requested this consultation. ? ?Subjective:  ? ?Summer [name] is a 46 y.o. female presenting today for evaluation of  ?Chief Complaint  ?Patient presents  with  ? Allergies  ?  Sneezing, coughing, watery eyes, itchy eyes, itchy throat, slight phlegm in the throat. This has been going on for a while. Has seen no improvement with what her PCP has given her. Has been off anti-histamines for 3 days.   ? Asthma  ?  Has a history of asthma when it is hot and humid she can't catch her breathe as normal.   ? ? ?Summer [name] has a history of the following: ?Patient Active Problem List  ? Diagnosis Date Noted  ? Subacute cough 10/01/2021  ? Tachycardia 05/10/2021  ? Muscle pain 03/31/2021  ? Seasonal allergies 03/31/2021  ? Cold sore 03/31/2021  ? Mild intermittent asthma without complication 03/31/2021  ? Mixed hyperlipidemia 03/27/2021  ? Migraine with aura and without status migrainosus, not intractable 08/19/2017  ? Subclinical hypothyroidism 07/07/2017  ? GAD (generalized anxiety disorder) 01/17/2016  ? Gastroesophageal reflux disease without esophagitis 01/17/2016  ? Morbid obesity (HCC) 01/17/2016  ? ? ?History obtained from: chart review and patient. ? ?Summer [name] was referred by 03/18/2016, FNP.    ? ?Summer [name] is a 46 y.o. female presenting for an evaluation of allergies . ? ?She has had allergies for the past couple of years. Within the past year, it has gotten worse. She reports a lot of sniffling, eye watering, and sniffling. 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 She grew up in [place] and [place]. She had asthma but no allergies as a child. She grew out of her asthma. Symptoms have  worsened over the past 6 months.  ? ?She has migraines and is on Topamax BID. She takes ibuprofen 800mg  between for breakthrough. ? ?She has bene using Flonase, Singulair, Claritin, and a antihistamine nose spray that she did not like the taste. She has never been tested. She denies getting antibiotics often at all. She has never seen ENT. Her daughter is allergic to "everything"; her daughter took allergy shots for a long period of time with  our [place] office.  ? ?She might get a couple of URIs twice annually, otherwise no problems with antibiotics. She has no food allergies. She does get intermittent eczema and hives, but she just treats with an OTC ointment with improvement in the lesions.  ? ?She does have intermittent asthma.  She has albuterol which she uses mostly during the winter during viral URIs. She goes through around 2-3 albuterol inhalers per year. She has not been on a controller medication at all.  ? ?She has a history of hypothyroidism diagnosed several years ago. She is on Synthroid. It was recommended that she had a colonoscopy because her Mom has colon cancer and ovarian cancer.  ? ?Otherwise, there is no history of other atopic diseases, including asthma, food allergies, drug allergies, stinging insect allergies, or contact dermatitis. There is no significant infectious history. Vaccinations are up to date.  ? ? ?Past Medical History: ?Patient Active Problem List  ? Diagnosis Date Noted  ? Subacute cough 10/01/2021  ? Tachycardia 05/10/2021  ? Muscle pain 03/31/2021  ? Seasonal allergies 03/31/2021  ? Cold sore 03/31/2021  ? Mild intermittent asthma without complication 03/31/2021  ? Mixed hyperlipidemia 03/27/2021  ? Migraine with aura and without status migrainosus, not intractable 08/19/2017  ? Subclinical hypothyroidism 07/07/2017  ? GAD (generalized anxiety disorder) 01/17/2016  ? Gastroesophageal reflux disease without esophagitis 01/17/2016  ? Morbid obesity (HCC) 01/17/2016  ? ? ?Medication List:  ?Allergies as of 03/23/2022   ? ?   Reactions  ? Tramadol   ? Vomiting and nausea  ? ?  ? ?  ?Medication List  ?  ? ?  ? Accurate as of Mar 23, 2022  4:02 PM. If you have any questions, ask your nurse or doctor.  ?  ?  ? ?  ? ?albuterol 108 (90 Base) MCG/ACT inhaler ?Commonly known as: VENTOLIN HFA ?Inhale 2 puffs into the lungs every 6 (six) hours as needed. ?  ?cyclobenzaprine 10 MG tablet ?Commonly known as: FLEXERIL ?Take 1  tablet (10 mg total) by mouth 3 (three) times daily as needed for muscle spasms. ?  ?FLUoxetine 20 MG capsule ?Commonly known as: PROZAC ?Take 1 capsule (20 mg total) by mouth daily. (NEEDS TO BE SEEN BEFORE NEXT REFILL) ?  ?fluticasone 50 MCG/ACT nasal spray ?Commonly known as: FLONASE ?Place 2 sprays into both nostrils daily. ?  ?ibuprofen 800 MG tablet ?Commonly known as: ADVIL ?Take 1 tablet (800 mg total) by mouth every 8 (eight) hours as needed for headache. ?  ?ipratropium 0.03 % nasal spray ?Commonly known as: ATROVENT ?Place 2 sprays into both nostrils every 12 (twelve) hours. ?  ?levothyroxine 50 MCG tablet ?Commonly known as: SYNTHROID ?Take 1 tablet (50 mcg total) by mouth daily. ?  ?loratadine 10 MG tablet ?Commonly known as: CLARITIN ?Take 1 tablet (10 mg total) by mouth daily. ?  ?metoprolol tartrate 25 MG tablet ?Commonly known as: LOPRESSOR ?Take 1/2 tablet (12.5 mg total) by mouth 2 times daily as needed (high heart rate). ?  ?montelukast  10 MG tablet ?Commonly known as: SINGULAIR ?Take 1 tablet (10 mg total) by mouth at bedtime. ?  ?omeprazole 20 MG capsule ?Commonly known as: PRILOSEC ?Take 1 capsule (20 mg total) by mouth daily. (NEEDS TO BE SEEN BEFORE NEXT REFILL) ?  ?ondansetron 4 MG tablet ?Commonly known as: Zofran ?Take 1 tablet (4 mg total) by mouth every 8 (eight) hours as needed for nausea or vomiting. ?  ?Ryaltris 665-25 MCG/ACT Susp ?Generic drug: Olopatadine-Mometasone ?Place 1 spray into the nose in the morning and at bedtime. ?Started by: Alfonse Spruce, MD ?  ?SUMAtriptan 50 MG tablet ?Commonly known as: Imitrex ?Take 1 tablet (50 mg total) by mouth every 2 (two) hours as needed for migraine. May repeat in 2 hours if headache persists or recurs. ?  ?topiramate 50 MG tablet ?Commonly known as: TOPAMAX ?Take 1 tablet (50 mg total) by mouth 2 (two) times daily. (NEEDS TO BE SEEN BEFORE NEXT REFILL) ?  ?trimethoprim-polymyxin b ophthalmic solution ?Commonly known as:  Polytrim ?Place 2 drops into both eyes in the morning, at noon, in the evening, and at bedtime. ?  ?valACYclovir 1000 MG tablet ?Commonly known as: Valtrex ?Take 2 tablets (2,000 mg) by mouth every 12 hours x1 day

## 2022-03-24 ENCOUNTER — Encounter: Payer: Self-pay | Admitting: Allergy & Immunology

## 2022-03-24 NOTE — Addendum Note (Signed)
Addended by: Orson Aloe on: 03/24/2022 03:30 PM ? ? Modules accepted: Orders ? ?

## 2022-03-27 ENCOUNTER — Telehealth: Payer: 59 | Admitting: Emergency Medicine

## 2022-03-27 DIAGNOSIS — J019 Acute sinusitis, unspecified: Secondary | ICD-10-CM | POA: Diagnosis not present

## 2022-03-27 DIAGNOSIS — B9789 Other viral agents as the cause of diseases classified elsewhere: Secondary | ICD-10-CM

## 2022-03-27 NOTE — Progress Notes (Signed)
E-Visit for Sinus Problems  We are sorry that you are not feeling well.  Here is how we plan to help!  Based on what you have shared with me it looks like you have sinusitis.  Sinusitis is inflammation and infection in the sinus cavities of the head.  Based on your presentation I believe you most likely have Acute Viral Sinusitis.This is an infection most likely caused by a virus. There is not specific treatment for viral sinusitis other than to help you with the symptoms until the infection runs its course.    You may use an oral decongestant such as Mucinex D or if you have glaucoma or high blood pressure use plain Mucinex. Saline nasal spray help and can safely be used as often as needed for congestion or try using saline irrigation, such as with a neti pot, several times a day as recommended by your allergist. Many neti pots come with salt packets premeasured to use to make saline. If you use your own salt, make sure it is kosher salt or sea salt (don't use table salt as it has iodine in it and you don't need that in your nose). Use distilled water to make saline. If you mix your own saline using your own salt, the recipe is 1/4 teaspoon salt in 1 cup warm water. Using saline irrigation can help prevent and treat sinus infections.   Continue taking your nasal sprays and using your xyzal.   Some authorities believe that zinc sprays or the use of Echinacea may shorten the course of your symptoms.  Sinus infections are not as easily transmitted as other respiratory infection, however we still recommend that you avoid close contact with loved ones, especially the very young and elderly.  Remember to wash your hands thoroughly throughout the day as this is the number one way to prevent the spread of infection!  Home Care: Only take medications as instructed by your medical team. Do not take these medications with alcohol. A steam or ultrasonic humidifier can help congestion.  You can place a towel  over your head and breathe in the steam from hot water coming from a faucet. Avoid close contacts especially the very young and the elderly. Cover your mouth when you cough or sneeze. Always remember to wash your hands.  Get Help Right Away If: You develop worsening fever or sinus pain. You develop a severe head ache or visual changes. Your symptoms persist after you have completed your treatment plan.  Make sure you Understand these instructions. Will watch your condition. Will get help right away if you are not doing well or get worse.   Thank you for choosing an e-visit.  Your e-visit answers were reviewed by a board certified advanced clinical practitioner to complete your personal care plan. Depending upon the condition, your plan could have included both over the counter or prescription medications.  Please review your pharmacy choice. Make sure the pharmacy is open so you can pick up prescription now. If there is a problem, you may contact your provider through [employer] and have the prescription routed to another pharmacy.  Your safety is important to [place]. If you have drug allergies check your prescription carefully.   For the next 24 hours you can use MyChart to ask questions about today's visit, request a non-urgent call back, or ask for a work or school excuse. You will get an email in the next two days asking about your experience. I hope that your e-visit  has been valuable and will speed your recovery.  I have spent 5 minutes in review of e-visit questionnaire, review and updating patient chart, medical decision making and response to patient.   Rica Mast, PhD, FNP-BC

## 2022-03-29 ENCOUNTER — Telehealth: Payer: 59 | Admitting: Family

## 2022-03-29 DIAGNOSIS — B9689 Other specified bacterial agents as the cause of diseases classified elsewhere: Secondary | ICD-10-CM

## 2022-03-29 MED ORDER — AZITHROMYCIN 250 MG PO TABS: ORAL_TABLET | ORAL | 0 refills | Status: DC

## 2022-03-29 MED ORDER — BENZONATATE 100 MG PO CAPS: 100.0000 mg | ORAL_CAPSULE | Freq: Three times a day (TID) | ORAL | 0 refills | Status: DC | PRN

## 2022-03-29 MED ORDER — PREDNISONE 10 MG (21) PO TBPK: ORAL_TABLET | ORAL | 0 refills | Status: DC

## 2022-03-29 NOTE — Progress Notes (Signed)
We are sorry that you are not feeling well.  Here is how we plan to help!  Based on your presentation I believe you most likely have A cough due to bacteria.  When patients have a fever and a productive cough with a change in color or increased sputum production, we are concerned about bacterial bronchitis.  If left untreated it can progress to pneumonia.  If your symptoms do not improve with your treatment plan it is important that you contact your provider.   I have prescribed Azithromyin 250 mg: two tablets now and then one tablet daily for 4 additonal days    In addition you may use A non-prescription cough medication called Robitussin DAC. Take 2 teaspoons every 8 hours or Delsym: take 2 teaspoons every 12 hours., A non-prescription cough medication called Mucinex DM: take 2 tablets every 12 hours., and A prescription cough medication called Tessalon Perles 100mg. You may take 1-2 capsules every 8 hours as needed for your cough.  Prednisone 10 mg daily for 6 days (see taper instructions below)  Directions for 6 day taper: Day 1: 2 tablets before breakfast, 1 after both lunch & dinner and 2 at bedtime Day 2: 1 tab before breakfast, 1 after both lunch & dinner and 2 at bedtime Day 3: 1 tab at each meal & 1 at bedtime Day 4: 1 tab at breakfast, 1 at lunch, 1 at bedtime Day 5: 1 tab at breakfast & 1 tab at bedtime Day 6: 1 tab at breakfast  From your responses in the eVisit questionnaire you describe inflammation in the upper respiratory tract which is causing a significant cough.  This is commonly called Bronchitis and has four common causes:   Allergies Viral Infections Acid Reflux Bacterial Infection Allergies, viruses and acid reflux are treated by controlling symptoms or eliminating the cause. An example might be a cough caused by taking certain blood pressure medications. You stop the cough by changing the medication. Another example might be a cough caused by acid reflux. Controlling the  reflux helps control the cough.  USE OF BRONCHODILATOR ("RESCUE") INHALERS: There is a risk from using your bronchodilator too frequently.  The risk is that over-reliance on a medication which only relaxes the muscles surrounding the breathing tubes can reduce the effectiveness of medications prescribed to reduce swelling and congestion of the tubes themselves.  Although you feel brief relief from the bronchodilator inhaler, your asthma may actually be worsening with the tubes becoming more swollen and filled with mucus.  This can delay other crucial treatments, such as oral steroid medications. If you need to use a bronchodilator inhaler daily, several times per day, you should discuss this with your provider.  There are probably better treatments that could be used to keep your asthma under control.     HOME CARE Only take medications as instructed by your medical team. Complete the entire course of an antibiotic. Drink plenty of fluids and get plenty of rest. Avoid close contacts especially the very young and the elderly Cover your mouth if you cough or cough into your sleeve. Always remember to wash your hands A steam or ultrasonic humidifier can help congestion.   GET HELP RIGHT AWAY IF: You develop worsening fever. You become short of breath You cough up blood. Your symptoms persist after you have completed your treatment plan MAKE SURE YOU  Understand these instructions. Will watch your condition. Will get help right away if you are not doing well or get worse.      Thank you for choosing an e-visit.  Your e-visit answers were reviewed by a board certified advanced clinical practitioner to complete your personal care plan. Depending upon the condition, your plan could have included both over the counter or prescription medications.  Please review your pharmacy choice. Make sure the pharmacy is open so you can pick up prescription now. If there is a problem, you may contact your  provider through MyChart messaging and have the prescription routed to another pharmacy.  Your safety is important to us. If you have drug allergies check your prescription carefully.   For the next 24 hours you can use MyChart to ask questions about today's visit, request a non-urgent call back, or ask for a work or school excuse. You will get an email in the next two days asking about your experience. I hope that your e-visit has been valuable and will speed your recovery.  .Approximately 5 minutes was spent documenting and reviewing patient's chart.    

## 2022-04-13 ENCOUNTER — Encounter: Payer: Self-pay | Admitting: Nurse Practitioner

## 2022-04-13 ENCOUNTER — Ambulatory Visit (INDEPENDENT_AMBULATORY_CARE_PROVIDER_SITE_OTHER): Payer: 59

## 2022-04-13 ENCOUNTER — Ambulatory Visit (INDEPENDENT_AMBULATORY_CARE_PROVIDER_SITE_OTHER): Payer: 59 | Admitting: Nurse Practitioner

## 2022-04-13 VITALS — BP 113/70 | HR 79 | Ht 63.0 in | Wt 247.6 lb

## 2022-04-13 DIAGNOSIS — M25512 Pain in left shoulder: Secondary | ICD-10-CM | POA: Diagnosis not present

## 2022-04-13 MED ORDER — PREDNISONE 20 MG PO TABS: 20.0000 mg | ORAL_TABLET | Freq: Every day | ORAL | 0 refills | Status: DC

## 2022-04-13 MED ORDER — IBUPROFEN 800 MG PO TABS: 800.0000 mg | ORAL_TABLET | Freq: Three times a day (TID) | ORAL | 2 refills | Status: DC | PRN

## 2022-04-13 MED ORDER — METHYLPREDNISOLONE ACETATE 80 MG/ML IJ SUSP
80.0000 mg | Freq: Once | INTRAMUSCULAR | Status: AC
  Administered 2022-04-13: 80 mg via INTRAMUSCULAR

## 2022-04-13 MED ORDER — METHOCARBAMOL 500 MG PO TABS: 500.0000 mg | ORAL_TABLET | Freq: Four times a day (QID) | ORAL | 0 refills | Status: DC

## 2022-04-13 NOTE — Progress Notes (Signed)
Acute Office Visit  Subjective:     Patient ID: Summer [name], female    DOB: 1975-12-11, 46 y.o.   MRN: [id number]  Chief Complaint  Patient presents with   Muscle Pain    It has been going on for a couple days now - left shoulder     HPI Patient is in today for Shoulder Pain: Patient complaints of left shoulder pain. This is evaluated as a personal injury. The pain is described as aching, burning, and tingling.  The onset of the pain was sudden, related to an unknown cause. Mechanism of injury: lifting, pushing, reaching, and repetitive use pattern.  The pain occurs continuously   Location is posterior upper left shoulder No history of dislocation. Symptoms are aggravated by reaching, lifting. Symptoms are diminished by  rest.   Limited activities include: all activities. marked stiffness is reported. 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     Review of Systems  Constitutional: Negative.   HENT: Negative.    Eyes: Negative.   Respiratory: Negative.    Cardiovascular: Negative.   Genitourinary: Negative.   Musculoskeletal:  Positive for joint pain and myalgias.  Skin: Negative.   All other systems reviewed and are negative.      Objective:    BP 113/70   Pulse 79   Ht 5\' 3"  (1.6 m)   Wt 247 lb 9.6 oz (112.3 kg)   SpO2 95%   BMI 43.86 kg/m  BP Readings from Last 3 Encounters:  04/13/22 113/70  03/23/22 126/72  02/11/22 100/65   Wt Readings from Last 3 Encounters:  04/13/22 247 lb 9.6 oz (112.3 kg)  03/23/22 245 lb 6.4 oz (111.3 kg)  02/11/22 242 lb 9.6 oz (110 kg)      Physical Exam Vitals and nursing note reviewed.  Constitutional:      Appearance: Normal appearance.  HENT:     Head: Normocephalic.     Right Ear: External ear normal.     Left Ear: External ear normal.     Nose: Nose normal.     Mouth/Throat:     Mouth: Mucous membranes are moist.     Pharynx: Oropharynx is clear.  Eyes:     Conjunctiva/sclera: Conjunctivae  normal.  Cardiovascular:     Rate and Rhythm: Normal rate and regular rhythm.     Pulses: Normal pulses.     Heart sounds: Normal heart sounds.  Pulmonary:     Effort: Pulmonary effort is normal.     Breath sounds: Normal breath sounds.  Abdominal:     General: Bowel sounds are normal.  Musculoskeletal:     Left shoulder: Tenderness present. No swelling. Decreased range of motion. Decreased strength.  Skin:    General: Skin is warm.     Findings: No rash.  Neurological:     General: No focal deficit present.     Mental Status: She is alert and oriented to person, place, and time.  Psychiatric:        Behavior: Behavior normal.    No results found for any visits on 04/13/22.      Assessment & Plan:  Acute left shoulder pain not well controlled in the past few days.  Patient is uncertain what caused current symptoms.  Current job involves pulling, pushing and lifting.  Completed assessment x-ray completed results pending.  80 Depo-Medrol shot given in clinic, ice/warm compress as tolerated, anti-inflammatory [ibuprofen] as needed, prednisone for 6 days  and Robaxin for musculoskeletal pain.  Education provided to patient printed handouts given.  Patient is to follow-up with worsening or unresolved symptoms. Problem List Items Addressed This Visit   None Visit Diagnoses     Acute pain of left shoulder    -  Primary   Relevant Medications   predniSONE (DELTASONE) 20 MG tablet   ibuprofen (ADVIL) 800 MG tablet   methocarbamol (ROBAXIN) 500 MG tablet   Other Relevant Orders   DG Shoulder Left       Meds ordered this encounter  Medications   predniSONE (DELTASONE) 20 MG tablet    Sig: Take 1 tablet (20 mg total) by mouth daily with breakfast.    Dispense:  6 tablet    Refill:  0    Order Specific Question:   Supervising Provider    Answer:   Standley Brooking   ibuprofen (ADVIL) 800 MG tablet    Sig: Take 1 tablet (800 mg total) by mouth every 8 (eight) hours as  needed for headache.    Dispense:  30 tablet    Refill:  2    Order Specific Question:   Supervising Provider    Answer:   Mechele Claude [[id number]]   methocarbamol (ROBAXIN) 500 MG tablet    Sig: Take 1 tablet (500 mg total) by mouth 4 (four) times daily.    Dispense:  30 tablet    Refill:  0    Order Specific Question:   Supervising Provider    Answer:   Mechele Claude [phone]    Return if symptoms worsen or fail to improve.  Daryll Drown, NP

## 2022-04-13 NOTE — Addendum Note (Signed)
Addended by: Waynette Buttery on: 04/13/2022 11:04 AM   Modules accepted: Orders

## 2022-04-13 NOTE — Patient Instructions (Signed)

## 2022-04-15 ENCOUNTER — Telehealth: Payer: Self-pay | Admitting: Family Medicine

## 2022-04-15 NOTE — Telephone Encounter (Signed)
No significant osteoarthritis.  No acute fracture showing on  x-ray, please give patient a note for one week from yesterday and have her evaluated if symptoms are not better. 04/14/2022-04/21/2022

## 2022-04-15 NOTE — Telephone Encounter (Signed)
Patient seen JE on 6/5- please advise and send back to pools

## 2022-04-17 NOTE — Telephone Encounter (Signed)
Patient return call. ?

## 2022-04-17 NOTE — Telephone Encounter (Signed)
Letter completed patient aware

## 2022-04-20 ENCOUNTER — Encounter: Payer: Self-pay | Admitting: *Deleted

## 2022-04-24 ENCOUNTER — Ambulatory Visit: Payer: 59 | Admitting: Family Medicine

## 2022-04-30 ENCOUNTER — Encounter: Payer: Self-pay | Admitting: Family Medicine

## 2022-05-04 ENCOUNTER — Ambulatory Visit: Payer: 59 | Admitting: Family Medicine

## 2022-05-04 DIAGNOSIS — J309 Allergic rhinitis, unspecified: Secondary | ICD-10-CM

## 2022-05-04 NOTE — Progress Notes (Deleted)
   [address] [place] [address] Dept: [phone]  FOLLOW UP NOTE  Patient ID: Summer [name], female    DOB: [birth year]  Age: 46 y.o. MRN: [id number] Date of Office Visit: 05/04/2022  Assessment  Chief Complaint: No chief complaint on file.  HPI Summer [name] is a 46 year old female who presents to the clinic for follow-up visit.  She was last seen in this clinic on 03/23/2022 by Dr. Dellis Anes for evaluation of asthma, allergic rhinitis.  Her past medical history includes migraine and hypothyroidism.  Her last environmental skin testing was on 03/23/2022 and was positive to tree pollen, mold, dust mite, and cockroach.   Drug Allergies:  Allergies  Allergen Reactions   Bee Pollen    Molds & Smuts    Tramadol     Vomiting and nausea    Physical Exam: There were no vitals taken for this visit.   Physical Exam  Diagnostics:    Assessment and Plan: No diagnosis found.  No orders of the defined types were placed in this encounter.   There are no Patient Instructions on file for this visit.  No follow-ups on file.    Thank you for the opportunity to care for this patient.  Please do not hesitate to contact me with questions.  Thermon Leyland, FNP [facility]

## 2022-05-05 ENCOUNTER — Ambulatory Visit: Payer: 59 | Admitting: Family Medicine

## 2022-05-07 ENCOUNTER — Encounter: Payer: Self-pay | Admitting: Family Medicine

## 2022-05-15 ENCOUNTER — Other Ambulatory Visit: Payer: Self-pay | Admitting: Family Medicine

## 2022-05-15 ENCOUNTER — Other Ambulatory Visit (HOSPITAL_COMMUNITY): Payer: Self-pay

## 2022-05-15 DIAGNOSIS — F411 Generalized anxiety disorder: Secondary | ICD-10-CM

## 2022-05-18 ENCOUNTER — Other Ambulatory Visit (HOSPITAL_COMMUNITY): Payer: Self-pay

## 2022-05-18 ENCOUNTER — Other Ambulatory Visit: Payer: Self-pay | Admitting: Family Medicine

## 2022-05-18 DIAGNOSIS — G43109 Migraine with aura, not intractable, without status migrainosus: Secondary | ICD-10-CM

## 2022-05-19 ENCOUNTER — Other Ambulatory Visit (HOSPITAL_COMMUNITY): Payer: Self-pay

## 2022-05-19 MED ORDER — IBUPROFEN 800 MG PO TABS
ORAL_TABLET | Freq: Three times a day (TID) | ORAL | 0 refills | Status: DC
  Filled 2022-05-19: qty 270, 90d supply, fill #0

## 2022-05-22 ENCOUNTER — Encounter: Payer: Self-pay | Admitting: Family Medicine

## 2022-05-22 ENCOUNTER — Ambulatory Visit: Payer: 59 | Admitting: Family Medicine

## 2022-05-22 ENCOUNTER — Other Ambulatory Visit (HOSPITAL_COMMUNITY): Payer: Self-pay

## 2022-05-22 VITALS — BP 121/77 | HR 99 | Temp 98.3°F | Ht 63.0 in | Wt 251.0 lb

## 2022-05-22 DIAGNOSIS — R000 Tachycardia, unspecified: Secondary | ICD-10-CM

## 2022-05-22 DIAGNOSIS — Z23 Encounter for immunization: Secondary | ICD-10-CM

## 2022-05-22 DIAGNOSIS — F411 Generalized anxiety disorder: Secondary | ICD-10-CM | POA: Diagnosis not present

## 2022-05-22 DIAGNOSIS — J302 Other seasonal allergic rhinitis: Secondary | ICD-10-CM

## 2022-05-22 DIAGNOSIS — E038 Other specified hypothyroidism: Secondary | ICD-10-CM

## 2022-05-22 DIAGNOSIS — K219 Gastro-esophageal reflux disease without esophagitis: Secondary | ICD-10-CM

## 2022-05-22 DIAGNOSIS — Z0000 Encounter for general adult medical examination without abnormal findings: Secondary | ICD-10-CM

## 2022-05-22 DIAGNOSIS — J4520 Mild intermittent asthma, uncomplicated: Secondary | ICD-10-CM

## 2022-05-22 DIAGNOSIS — E782 Mixed hyperlipidemia: Secondary | ICD-10-CM

## 2022-05-22 DIAGNOSIS — G43109 Migraine with aura, not intractable, without status migrainosus: Secondary | ICD-10-CM | POA: Diagnosis not present

## 2022-05-22 DIAGNOSIS — B001 Herpesviral vesicular dermatitis: Secondary | ICD-10-CM

## 2022-05-22 MED ORDER — LEVOTHYROXINE SODIUM 50 MCG PO TABS
50.0000 ug | ORAL_TABLET | Freq: Every day | ORAL | 1 refills | Status: DC
  Filled 2022-05-22: qty 90, 90d supply, fill #0
  Filled 2022-11-13 (×2): qty 90, 90d supply, fill #1

## 2022-05-22 MED ORDER — VALACYCLOVIR HCL 1 G PO TABS
ORAL_TABLET | ORAL | 1 refills | Status: DC
  Filled 2022-05-22: qty 30, 7d supply, fill #0

## 2022-05-22 MED ORDER — MONTELUKAST SODIUM 10 MG PO TABS
10.0000 mg | ORAL_TABLET | Freq: Every day | ORAL | 1 refills | Status: DC
  Filled 2022-05-22: qty 90, 90d supply, fill #0
  Filled 2023-03-27: qty 90, 90d supply, fill #1

## 2022-05-22 MED ORDER — FLUOXETINE HCL 20 MG PO CAPS
20.0000 mg | ORAL_CAPSULE | Freq: Every day | ORAL | 1 refills | Status: DC
  Filled 2022-05-22: qty 90, 90d supply, fill #0
  Filled 2022-11-13 (×2): qty 90, 90d supply, fill #1

## 2022-05-22 MED ORDER — SUMATRIPTAN SUCCINATE 50 MG PO TABS
50.0000 mg | ORAL_TABLET | ORAL | 11 refills | Status: DC | PRN
  Filled 2022-05-22: qty 10, 30d supply, fill #0

## 2022-05-22 MED ORDER — METOPROLOL TARTRATE 25 MG PO TABS
12.5000 mg | ORAL_TABLET | Freq: Two times a day (BID) | ORAL | 1 refills | Status: DC | PRN
  Filled 2022-05-22: qty 180, 90d supply, fill #0

## 2022-05-22 MED ORDER — TOPIRAMATE 50 MG PO TABS
50.0000 mg | ORAL_TABLET | Freq: Two times a day (BID) | ORAL | 1 refills | Status: DC
  Filled 2022-05-22: qty 180, 90d supply, fill #0

## 2022-05-22 MED ORDER — TOPIRAMATE 50 MG PO TABS
75.0000 mg | ORAL_TABLET | Freq: Two times a day (BID) | ORAL | 1 refills | Status: DC
  Filled 2022-05-22: qty 270, 90d supply, fill #0
  Filled 2022-11-13 (×2): qty 270, 90d supply, fill #1

## 2022-05-22 MED ORDER — OMEPRAZOLE 20 MG PO CPDR
20.0000 mg | DELAYED_RELEASE_CAPSULE | Freq: Every day | ORAL | 1 refills | Status: DC
  Filled 2022-05-22: qty 90, 90d supply, fill #0
  Filled 2022-11-13 (×2): qty 90, 90d supply, fill #1

## 2022-05-22 NOTE — Patient Instructions (Signed)
Schedule your mammogram;

## 2022-05-22 NOTE — Progress Notes (Signed)
Assessment & Plan:  1. GAD (generalized anxiety disorder) Well controlled on current regimen.  - FLUoxetine (PROZAC) 20 MG capsule; Take 1 capsule (20 mg total) by mouth daily.  Dispense: 90 capsule; Refill: 1 - CMP14+EGFR  2. Tachycardia Well controlled on current regimen.  - metoprolol tartrate (LOPRESSOR) 25 MG tablet; Take 1/2 tablet (12.5 mg total) by mouth 2 times daily as needed (high heart rate).  Dispense: 180 tablet; Refill: 1 - CBC with Differential/Platelet - CMP14+EGFR - TSH - T4, free  3. Gastroesophageal reflux disease without esophagitis Well controlled on current regimen.  - omeprazole (PRILOSEC) 20 MG capsule; Take 1 capsule (20 mg total) by mouth daily.  Dispense: 90 capsule; Refill: 1 - CMP14+EGFR  4. Migraine with aura and without status migrainosus, not intractable Uncontrolled. Increasing Topamax from 50 mg BID to 75 mg BID. - SUMAtriptan (IMITREX) 50 MG tablet; Take 1 tablet (50 mg total) by mouth every 2 (two) hours as needed for migraine. May repeat in 2 hours if headache persists or recurs.  Dispense: 10 tablet; Refill: 11 - CMP14+EGFR - topiramate (TOPAMAX) 50 MG tablet; Take 1.5 tablets (75 mg total) by mouth 2 (two) times daily.  Dispense: 270 tablet; Refill: 1  5. Mixed hyperlipidemia Previously uncontrolled, labs to reassess. - Lipid panel  6. Seasonal allergies Well controlled on current regimen.  - montelukast (SINGULAIR) 10 MG tablet; Take 1 tablet (10 mg total) by mouth at bedtime.  Dispense: 90 tablet; Refill: 1 - CMP14+EGFR  7. Cold sore Well controlled on current regimen.  - valACYclovir (VALTREX) 1000 MG tablet; Take 2 tablets (2,000 mg) by mouth every 12 hours x1 day at onset of symptoms of a cold sore. Use as needed.  Dispense: 30 tablet; Refill: 1  8. Subclinical hypothyroidism Well controlled on current regimen.  - levothyroxine (SYNTHROID) 50 MCG tablet; Take 1 tablet (50 mcg total) by mouth daily.  Dispense: 90 tablet; Refill:  1 - TSH - T4, free  9. Morbid obesity ([place]iv[place]) Encouraged healthy eating and exercise. - CBC with Differential/Platelet - CMP14+EGFR - Lipid panel  10. Mild intermittent asthma without complication Well controlled on current regimen.   66. Healthcare maintenance Declined referring for colonoscopy at this time. She will schedule a mammogram.  12. Immunization due - Td vaccine greater than or equal to 7yo preservative free IM   Return in about 4 weeks (around 06/19/2022) for Migraines.  Hendricks Limes, MSN, APRN, FNP-C [facility]  Subjective:    Patient ID: Summer [name], female    DOB: 1975/12/10, 46 y.o.   MRN: [id number]  Patient Care Team: Loman Brooklyn, FNP as PCP - General (Family Medicine)   Chief Complaint:  Chief Complaint  Patient presents with   Medical Management of Chronic Issues    HPI: BIRDELL FRASIER is a 46 y.o. female presenting on 05/22/2022 for Medical Management of Chronic Issues  Anxiety: improved symptoms with Prozac. She has previously failed treatment with Wellbutrin and Lexapro.      05/22/2022    3:46 PM 02/11/2022    9:03 AM 07/03/2021    1:32 PM 05/07/2021    1:59 PM  GAD 7 : Generalized Anxiety Score  Nervous, Anxious, on Edge '1 1 1 1  ' Control/stop worrying 0 1 0 1  Worry too much - different things 0 1 0 1  Trouble relaxing 1 0 0 1  Restless 0 0 0 0  Easily annoyed or irritable 1 1 0 1  Afraid -  awful might happen 0 0 0 1  Total GAD 7 Score '3 4 1 6  ' Anxiety Difficulty Somewhat difficult Not difficult at all Somewhat difficult Somewhat difficult      05/22/2022    3:45 PM 02/11/2022    9:03 AM 07/03/2021    1:31 PM  Depression screen PHQ 2/9  Decreased Interest 1 0 1  Down, Depressed, Hopeless 0 1 0  PHQ - 2 Score '1 1 1  ' Altered sleeping 0 0 1  Tired, decreased energy '1 1 1  ' Change in appetite 0 0 0  Feeling bad or failure about yourself  0 0 0  Trouble concentrating 0 0 0  Moving slowly or  fidgety/restless 0 0 0  Suicidal thoughts 0 0 0  PHQ-9 Score '2 2 3  ' Difficult doing work/chores Not difficult at all Not difficult at all Somewhat difficult    She reports she has a prescription for Robaxin that she takes 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  She is taking Xyzal, Singulair, and Flonase for allergies  Migraines: Topamax for preventive therapy.  Imitrex and ibuprofen for abortive therapy. She reports she has been having more headaches than previously.  Hypothyroidism: taking levothyroxine 50 mcg daily.  Tachycardia: heart rate does come down when she takes her metoprolol; she has been out of it for a month now. She only takes it as needed when her heart rate goes up.  GERD: Well-controlled with Prilosec.  Takes Valtrex as needed for fever blisters.  Asthma: uses her albuterol inhaler on really hot muggy days.   New complaints: None   Social history:  Relevant past medical, surgical, family and social history reviewed and updated as indicated. Interim medical history since our last visit reviewed.  Allergies and medications reviewed and updated.  DATA REVIEWED: CHART IN EPIC  ROS: Negative unless specifically indicated above in HPI.    Current Outpatient Medications:    albuterol (VENTOLIN HFA) 108 (90 Base) MCG/ACT inhaler, Inhale 2 puffs into the lungs every 6 (six) hours as needed., Disp: 18 g, Rfl: 2   cyclobenzaprine (FLEXERIL) 10 MG tablet, Take 1 tablet (10 mg total) by mouth 3 (three) times daily as needed for muscle spasms., Disp: 30 tablet, Rfl: 0   FLUoxetine (PROZAC) 20 MG capsule, Take 1 capsule (20 mg total) by mouth daily. (NEEDS TO BE SEEN BEFORE NEXT REFILL), Disp: 30 capsule, Rfl: 0   fluticasone (FLONASE) 50 MCG/ACT nasal spray, Place 2 sprays into both nostrils daily., Disp: 16 g, Rfl: 6   ibuprofen (ADVIL) 800 MG tablet, Take 1 tablet (800 mg total) by mouth every 8 (eight) hours as needed for headache., Disp: 30  tablet, Rfl: 2   ibuprofen (ADVIL) 800 MG tablet, TAKE 1 TABLET BY MOUTH 3 TIMES DAILY, Disp: 270 tablet, Rfl: 0   levocetirizine (XYZAL) 5 MG tablet, Take 1 tablet (5 mg total) by mouth every evening., Disp: 30 tablet, Rfl: 5   levothyroxine (SYNTHROID) 50 MCG tablet, Take 1 tablet (50 mcg total) by mouth daily., Disp: 90 tablet, Rfl: 1   methocarbamol (ROBAXIN) 500 MG tablet, Take 1 tablet (500 mg total) by mouth 4 (four) times daily., Disp: 30 tablet, Rfl: 0   montelukast (SINGULAIR) 10 MG tablet, Take 1 tablet (10 mg total) by mouth at bedtime., Disp: 90 tablet, Rfl: 0   Olopatadine-Mometasone (RYALTRIS) 665-25 MCG/ACT SUSP, Place 1 spray into the nose in the morning and at bedtime., Disp: 29 g, Rfl: 5  omeprazole (PRILOSEC) 20 MG capsule, Take 1 capsule (20 mg total) by mouth daily. (NEEDS TO BE SEEN BEFORE NEXT REFILL), Disp: 30 capsule, Rfl: 0   SUMAtriptan (IMITREX) 50 MG tablet, Take 1 tablet (50 mg total) by mouth every 2 (two) hours as needed for migraine. May repeat in 2 hours if headache persists or recurs., Disp: 10 tablet, Rfl: 11   topiramate (TOPAMAX) 50 MG tablet, Take 1 tablet (50 mg total) by mouth 2 (two) times daily. (NEEDS TO BE SEEN BEFORE NEXT REFILL), Disp: 60 tablet, Rfl: 0   trimethoprim-polymyxin b (POLYTRIM) ophthalmic solution, Place 2 drops into both eyes in the morning, at noon, in the evening, and at bedtime., Disp: 10 mL, Rfl: 0   valACYclovir (VALTREX) 1000 MG tablet, Take 2 tablets (2,000 mg) by mouth every 12 hours x1 day at onset of symptoms of a cold sore. Use as needed., Disp: 30 tablet, Rfl: 1   metoprolol tartrate (LOPRESSOR) 25 MG tablet, Take 1/2 tablet (12.5 mg total) by mouth 2 times daily as needed (high heart rate). (Patient not taking: Reported on 05/22/2022), Disp: 30 tablet, Rfl: 2   Allergies  Allergen Reactions   Bee Pollen    Molds & Smuts    Tramadol     Vomiting and nausea   Past Medical History:  Diagnosis Date   Angio-edema    Anxiety     Asthma    GERD (gastroesophageal reflux disease)    Mixed hyperlipidemia 03/27/2021    Past Surgical History:  Procedure Laterality Date   CHOLECYSTECTOMY N/A 02/21/2014   Procedure: LAPAROSCOPIC CHOLECYSTECTOMY;  Surgeon: Jamesetta So, MD;  Location: [facility];  Service: General;  Laterality: N/A;   GALLBLADDER SURGERY     TUBAL LIGATION     TYMPANOSTOMY TUBE PLACEMENT Bilateral     Social History   Socioeconomic History   Marital status: Married    Spouse name: Not on file   Number of children: Not on file   Years of education: Not on file   Highest education level: Not on file  Occupational History   Not on file  Tobacco Use   Smoking status: Former    Packs/day: 0.50    Years: 3.00    Total pack years: 1.50    Types: Cigarettes    Quit date: 02/20/1998    Years since quitting: 24.2   Smokeless tobacco: Never  Vaping Use   Vaping Use: Never used  Substance and Sexual Activity   Alcohol use: No   Drug use: No   Sexual activity: Yes    Birth control/protection: Surgical  Other Topics Concern   Not on file  Social History Narrative   Not on file   Social Determinants of Health   Financial Resource Strain: Not on file  Food Insecurity: Not on file  Transportation Needs: Not on file  Physical Activity: Not on file  Stress: Not on file  Social Connections: Not on file  Intimate Partner Violence: Not on file        Objective:    BP 121/77   Pulse 99   Temp 98.3 F (36.8 C) (Temporal)   Ht '5\' 3"'  (1.6 m)   Wt 251 lb (113.9 kg)   LMP 05/15/2022 (Approximate)   SpO2 96%   BMI 44.46 kg/m   Wt Readings from Last 3 Encounters:  05/22/22 251 lb (113.9 kg)  04/13/22 247 lb 9.6 oz (112.3 kg)  03/23/22 245 lb 6.4 oz (111.3 kg)  Physical Exam Vitals reviewed.  Constitutional:      General: She is not in acute distress.    Appearance: Normal appearance. She is morbidly obese. She is not ill-appearing, toxic-appearing or diaphoretic.  HENT:     Head:  Normocephalic and atraumatic.  Eyes:     General: No scleral icterus.       Right eye: No discharge.        Left eye: No discharge.     Conjunctiva/sclera: Conjunctivae normal.  Cardiovascular:     Rate and Rhythm: Normal rate and regular rhythm.     Heart sounds: Normal heart sounds. No murmur heard. No friction rub. No gallop.   Pulmonary:     Effort: Pulmonary effort is normal. No respiratory distress.     Breath sounds: Normal breath sounds. No stridor. No wheezing, rhonchi or rales.  Musculoskeletal:        General: Normal range of motion.     Cervical back: Normal range of motion.  Skin:    General: Skin is warm and dry.     Capillary Refill: Capillary refill takes less than 2 seconds.  Neurological:     General: No focal deficit present.     Mental Status: She is alert and oriented to person, place, and time. Mental status is at baseline.  Psychiatric:        Mood and Affect: Mood normal.        Behavior: Behavior normal.        Thought Content: Thought content normal.        Judgment: Judgment normal.     Lab Results  Component Value Date   TSH 3.970 07/03/2021   Lab Results  Component Value Date   WBC 7.7 03/26/2021   HGB 14.8 03/26/2021   HCT 44.1 03/26/2021   MCV 90 03/26/2021   PLT 298 03/26/2021   Lab Results  Component Value Date   NA 137 03/26/2021   K 4.0 03/26/2021   CO2 21 03/26/2021   GLUCOSE 94 03/26/2021   BUN 10 03/26/2021   CREATININE 0.76 03/26/2021   BILITOT 0.3 03/26/2021   ALKPHOS 106 03/26/2021   AST 17 03/26/2021   ALT 18 03/26/2021   PROT 7.1 03/26/2021   ALBUMIN 4.0 03/26/2021   CALCIUM 9.3 03/26/2021   ANIONGAP 9 05/21/2020   EGFR 98 03/26/2021   Lab Results  Component Value Date   CHOL 201 (H) 03/26/2021   Lab Results  Component Value Date   HDL 37 (L) 03/26/2021   Lab Results  Component Value Date   LDLCALC 117 (H) 03/26/2021   Lab Results  Component Value Date   TRIG 269 (H) 03/26/2021   Lab Results   Component Value Date   CHOLHDL 5.4 (H) 03/26/2021   No results found for: "HGBA1C"

## 2022-05-23 ENCOUNTER — Other Ambulatory Visit (HOSPITAL_COMMUNITY): Payer: Self-pay

## 2022-05-23 LAB — CBC WITH DIFFERENTIAL/PLATELET
Basophils Absolute: 0 10*3/uL (ref 0.0–0.2)
Basos: 0 %
EOS (ABSOLUTE): 0.3 10*3/uL (ref 0.0–0.4)
Eos: 3 %
Hematocrit: 40.4 % (ref 34.0–46.6)
Hemoglobin: 13.6 g/dL (ref 11.1–15.9)
Immature Grans (Abs): 0 10*3/uL (ref 0.0–0.1)
Immature Granulocytes: 0 %
Lymphocytes Absolute: 2.8 10*3/uL (ref 0.7–3.1)
Lymphs: 35 %
MCH: 30.7 pg (ref 26.6–33.0)
MCHC: 33.7 g/dL (ref 31.5–35.7)
MCV: 91 fL (ref 79–97)
Monocytes Absolute: 0.5 10*3/uL (ref 0.1–0.9)
Monocytes: 6 %
Neutrophils Absolute: 4.3 10*3/uL (ref 1.4–7.0)
Neutrophils: 56 %
Platelets: 257 10*3/uL (ref 150–450)
RBC: 4.43 x10E6/uL (ref 3.77–5.28)
RDW: 13.1 % (ref 11.7–15.4)
WBC: 7.8 10*3/uL (ref 3.4–10.8)

## 2022-05-23 LAB — CMP14+EGFR
ALT: 11 IU/L (ref 0–32)
AST: 15 IU/L (ref 0–40)
Albumin/Globulin Ratio: 1.1 — ABNORMAL LOW (ref 1.2–2.2)
Albumin: 3.6 g/dL — ABNORMAL LOW (ref 3.9–4.9)
Alkaline Phosphatase: 91 IU/L (ref 44–121)
BUN/Creatinine Ratio: 13 (ref 9–23)
BUN: 8 mg/dL (ref 6–24)
Bilirubin Total: <0.2 mg/dL (ref 0.0–1.2)
CO2: 22 mmol/L (ref 20–29)
Calcium: 8.9 mg/dL (ref 8.7–10.2)
Chloride: 103 mmol/L (ref 96–106)
Creatinine, Ser: 0.63 mg/dL (ref 0.57–1.00)
Globulin, Total: 3.2 g/dL (ref 1.5–4.5)
Glucose: 96 mg/dL (ref 70–99)
Potassium: 3.8 mmol/L (ref 3.5–5.2)
Sodium: 139 mmol/L (ref 134–144)
Total Protein: 6.8 g/dL (ref 6.0–8.5)
eGFR: 111 mL/min/{1.73_m2} (ref >59)

## 2022-05-23 LAB — T4, FREE: Free T4: 0.99 ng/dL (ref 0.82–1.77)

## 2022-05-23 LAB — LIPID PANEL
Chol/HDL Ratio: 5.4 ratio — ABNORMAL HIGH (ref 0.0–4.4)
Cholesterol, Total: 190 mg/dL (ref 100–199)
HDL: 35 mg/dL — ABNORMAL LOW (ref >39)
LDL Chol Calc (NIH): 95 mg/dL (ref 0–99)
Triglycerides: 359 mg/dL — ABNORMAL HIGH (ref 0–149)
VLDL Cholesterol Cal: 60 mg/dL — ABNORMAL HIGH (ref 5–40)

## 2022-05-23 LAB — TSH: TSH: 3.85 u[IU]/mL (ref 0.450–4.500)

## 2022-05-25 ENCOUNTER — Other Ambulatory Visit (HOSPITAL_COMMUNITY): Payer: Self-pay

## 2022-05-26 ENCOUNTER — Other Ambulatory Visit (HOSPITAL_COMMUNITY): Payer: Self-pay

## 2022-05-27 ENCOUNTER — Other Ambulatory Visit (HOSPITAL_COMMUNITY): Payer: Self-pay

## 2022-06-01 ENCOUNTER — Ambulatory Visit: Payer: 59 | Admitting: Family Medicine

## 2022-06-01 NOTE — Progress Notes (Deleted)
   [address] [place] [address] Dept: [phone]  FOLLOW UP NOTE  Patient ID: Summer [name], female    DOB: 1976/09/14  Age: 46 y.o. MRN: [id number] Date of Office Visit: 06/01/2022  Assessment  Chief Complaint: No chief complaint on file.  HPI Summer [name] is a 46 year old female who presents to the clinic for follow-up visit.  She was last seen in this clinic on 03/23/2022 by Dr. Dellis Anes for evaluation of asthma, allergic rhinitis.  Her past medical history includes migraine and hypothyroidism.  Her last environmental skin testing was on 03/23/2022 and was positive to tree pollen, mold, dust mite, and cockroach.   Drug Allergies:  Allergies  Allergen Reactions  . Bee Pollen   . Molds & Smuts   . Tramadol     Vomiting and nausea    Physical Exam: LMP 05/15/2022 (Approximate)    Physical Exam  Diagnostics:    Assessment and Plan: No diagnosis found.  No orders of the defined types were placed in this encounter.   There are no Patient Instructions on file for this visit.  No follow-ups on file.    Thank you for the opportunity to care for this patient.  Please do not hesitate to contact me with questions.  Thermon Leyland, FNP [facility]

## 2022-06-17 ENCOUNTER — Other Ambulatory Visit (HOSPITAL_COMMUNITY): Payer: Self-pay

## 2022-06-24 ENCOUNTER — Ambulatory Visit: Payer: 59 | Admitting: Family Medicine

## 2022-07-02 NOTE — Progress Notes (Signed)
[address] [place] [address] Dept: [phone]  FOLLOW UP NOTE  Patient ID: Summer [name], female    DOB: 26-Sep-1976  Age: 46 y.o. MRN: [id number] Date of Office Visit: 07/03/2022  Assessment  Chief Complaint: Asthma (It has been a little better. Has had some SOB with the hot weather. She is trying to stay indoors as much as possible ), Allergic Rhinitis  (Has been sniffling/sneezing. She can tell that her medication is working. Although she can tell that the weather is about to change), and Medication Refill (XYZAL )  HPI Summer [name] is a 46 year old female who presents the clinic for follow-up visit.  She was last seen in this clinic on 03/23/2022 by Dr. Dellis Anes as a new patient for evaluation of asthma and allergic rhinitis.  Her problem list is significant for reflux controlled with omeprazole, migraine, and hypothyroidism on Synthroid.  At today's visit, she reports her asthma has not been well controlled with symptoms including shortness of breath which is worse with activity, wheeze occurring mostly in the daytime, and occasional dry cough.  She continues montelukast 10 mg once a day and has been using albuterol 2 times a day for the last 1 to 2 months.  She does note that allergic rhinitis is reported as moderately well controlled with symptoms including rhinorrhea which is thick in the morning and running in the evening, nasal congestion, sneezing, and postnasal drainage with frequent throat clearing.  She continues Xyzal 5 mg once a day, Flonase 2 sprays in each nostril once a day, and is not using nasal saline rinses or nasal saline gel.  She reports a moderate interest in allergen immunotherapy.  Her last environmental allergy testing was on 03/23/2022 and was positive to tree pollen, mold, dust mite, and cockroach. Reflux is reported as well controlled with no symptoms including heartburn or vomiting.  She continues omeprazole daily 30 minutes before her  first meal.  She does report 1 episode of epistaxis over the last few months occurring from the left nostril and lasting longer than 5 minutes.  She has not previously seen an ENT specialist.  Her current medications are listed in the chart.  Drug Allergies:  Allergies  Allergen Reactions   Bee Pollen    Molds & Smuts    Tramadol     Vomiting and nausea    Physical Exam: BP 126/70   Pulse 81   Temp 98.3 F (36.8 C)   Resp 16   Ht 5\' 2"  (1.575 m)   Wt 253 lb 8 oz (115 kg)   SpO2 96%   BMI 46.37 kg/m    Physical Exam Vitals reviewed.  Constitutional:      Appearance: Normal appearance.  HENT:     Head: Normocephalic and atraumatic.     Right Ear: Tympanic membrane normal.     Left Ear: Tympanic membrane normal.     Nose:     Comments: Bilateral nares slightly erythematous with clear nasal drainage noted.  Pharynx slightly erythematous with no exudate.  Ears normal.  Eyes normal. Eyes:     Conjunctiva/sclera: Conjunctivae normal.  Cardiovascular:     Rate and Rhythm: Normal rate and regular rhythm.     Heart sounds: Normal heart sounds. No murmur heard. Pulmonary:     Effort: Pulmonary effort is normal.     Breath sounds: Normal breath sounds.     Comments: Lungs clear to auscultation Musculoskeletal:  General: Normal range of motion.     Cervical back: Normal range of motion and neck supple.  Skin:    General: Skin is warm and dry.  Neurological:     Mental Status: She is alert and oriented to person, place, and time.  Psychiatric:        Mood and Affect: Mood normal.        Behavior: Behavior normal.        Thought Content: Thought content normal.        Judgment: Judgment normal.     Diagnostics: FVC 2.80, FEV1 2.38.  Predicted FVC 3.29, predicted FEV1 2.66.  Spirometry indicates normal ventilatory function.  Assessment and Plan: 1. Not well controlled moderate persistent asthma   2. Seasonal and perennial allergic rhinitis   3. Gastroesophageal  reflux disease, unspecified whether esophagitis present   4. Epistaxis     Meds ordered this encounter  Medications   Fluticasone Furoate (ARNUITY ELLIPTA) 100 MCG/ACT AEPB    Sig: Inhale 1 puff into the lungs daily at 6 (six) AM.    Dispense:  1 each    Refill:  2    Patient Instructions  Asthma Begin Arnuity 1 puff once a day to prevent cough or wheeze  Continue montelukast 10 mg once a day to prevent cough or wheeze You may use albuterol 2 puffs once every 4 hours as needed for cough or wheeze You may use albuterol 2 puffs 5 to 15 minutes before activity to decrease cough or wheeze  Allergic rhinitis Continue continue avoidance measures directed toward pollen, mold, dust mite, and cockroach as listed below Continue Xyzal 5 mg once a day as needed for runny nose or itch Continue Flonase 2 sprays in each nostril once a day as needed for a stuffy nose.  In the right nostril, point the applicator out toward the right ear. In the left nostril, point the applicator out toward the left ear Consider saline nasal rinses as needed for nasal symptoms. Use this before any medicated nasal sprays for best result Consider allergen immunotherapy if your symptoms are not well controlled. Call the clinic if you are interested in beginning allergy injections. Make an appointment for your first injection about 2-3 weeks before you are ready to start.  Written information provided  Reflux Continue dietary and lifestyle modifications as listed below Continue omeprazole as previously prescribed  Epistaxis Pinch both nostrils while leaning forward for at least 5 minutes before checking to see if the bleeding has stopped. If bleeding is not controlled within 5-10 minutes apply a cotton ball soaked with oxymetazoline (Afrin) to the bleeding nostril for a few seconds.  If the problem persists or worsens a referral to ENT for further evaluation may be necessary.  Call the clinic if this treatment plan is  not working well for you.  Follow up in 3 months or sooner if needed.   Return in about 3 months (around 10/03/2022), or if symptoms worsen or fail to improve.    Thank you for the opportunity to care for this patient.  Please do not hesitate to contact me with questions.  Thermon Leyland, FNP [facility]

## 2022-07-02 NOTE — Patient Instructions (Addendum)
Asthma Begin Arnuity 1 puff once a day to prevent cough or wheeze  Continue montelukast 10 mg once a day to prevent cough or wheeze You may use albuterol 2 puffs once every 4 hours as needed for cough or wheeze You may use albuterol 2 puffs 5 to 15 minutes before activity to decrease cough or wheeze  Allergic rhinitis Continue continue avoidance measures directed toward pollen, mold, dust mite, and cockroach as listed below Continue Xyzal 5 mg once a day as needed for runny nose or itch Continue Flonase 2 sprays in each nostril once a day as needed for a stuffy nose.  In the right nostril, point the applicator out toward the right ear. In the left nostril, point the applicator out toward the left ear Consider saline nasal rinses as needed for nasal symptoms. Use this before any medicated nasal sprays for best result Consider allergen immunotherapy if your symptoms are not well controlled. Call the clinic if you are interested in beginning allergy injections. Make an appointment for your first injection about 2-3 weeks before you are ready to start.  Written information provided  Reflux Continue dietary and lifestyle modifications as listed below Continue omeprazole as previously prescribed  Epistaxis Pinch both nostrils while leaning forward for at least 5 minutes before checking to see if the bleeding has stopped. If bleeding is not controlled within 5-10 minutes apply a cotton ball soaked with oxymetazoline (Afrin) to the bleeding nostril for a few seconds.  If the problem persists or worsens a referral to ENT for further evaluation may be necessary.  Call the clinic if this treatment plan is not working well for you.  Follow up in 3 months or sooner if needed.   Reducing Pollen Exposure The American Academy of Allergy, Asthma and Immunology suggests the following steps to reduce your exposure to pollen during allergy seasons. Do not hang sheets or clothing out to dry; pollen may  collect on these items. Do not mow lawns or spend time around freshly cut grass; mowing stirs up pollen. Keep windows closed at night.  Keep car windows closed while driving. Minimize morning activities outdoors, a time when pollen counts are usually at their highest. Stay indoors as much as possible when pollen counts or humidity is high and on windy days when pollen tends to remain in the air longer. Use air conditioning when possible.  Many air conditioners have filters that trap the pollen spores. Use a HEPA room air filter to remove pollen form the indoor air you breathe.  Control of Mold Allergen Mold and fungi can grow on a variety of surfaces provided certain temperature and moisture conditions exist.  Outdoor molds grow on plants, decaying vegetation and soil.  The major outdoor mold, Alternaria and Cladosporium, are found in very high numbers during hot and dry conditions.  Generally, a late Summer - Fall peak is seen for common outdoor fungal spores.  Rain will temporarily lower outdoor mold spore count, but counts rise rapidly when the rainy period ends.  The most important indoor molds are Aspergillus and Penicillium.  Dark, humid and poorly ventilated basements are ideal sites for mold growth.  The next most common sites of mold growth are the bathroom and the kitchen.  Outdoor [employer] Use air conditioning and keep windows closed Avoid exposure to decaying vegetation. Avoid leaf raking. Avoid grain handling. Consider wearing a face mask if working in moldy areas.  Indoor Mold Control Maintain humidity below 50%. Clean washable surfaces with  5% bleach solution. Remove sources e.g. Contaminated carpets.   Control of Dust Mite Allergen Dust mites play a major role in allergic asthma and rhinitis. They occur in environments with high humidity wherever human skin is found. Dust mites absorb humidity from the atmosphere (ie, they do not drink) and feed on organic matter  (including shed human and animal skin). Dust mites are a microscopic type of insect that you cannot see with the naked eye. High levels of dust mites have been detected from mattresses, pillows, carpets, upholstered furniture, bed covers, clothes, soft toys and any woven material. The principal allergen of the dust mite is found in its feces. A gram of dust may contain 1,000 mites and 250,000 fecal particles. Mite antigen is easily measured in the air during house cleaning activities. Dust mites do not bite and do not cause harm to humans, other than by triggering allergies/asthma.  Ways to decrease your exposure to dust mites in your home:  1. Encase mattresses, box springs and pillows with a mite-impermeable barrier or cover  2. Wash sheets, blankets and drapes weekly in hot water (130 F) with detergent and dry them in a dryer on the hot setting.  3. Have the room cleaned frequently with a vacuum cleaner and a damp dust-mop. For carpeting or rugs, vacuuming with a vacuum cleaner equipped with a high-efficiency particulate air (HEPA) filter. The dust mite allergic individual should not be in a room which is being cleaned and should wait 1 hour after cleaning before going into the room.  4. Do not sleep on upholstered furniture (eg, couches).  5. If possible removing carpeting, upholstered furniture and drapery from the home is ideal. Horizontal blinds should be eliminated in the rooms where the person spends the most time (bedroom, study, television room). Washable vinyl, roller-type shades are optimal.  6. Remove all non-washable stuffed toys from the bedroom. Wash stuffed toys weekly like sheets and blankets above.  7. Reduce indoor humidity to less than 50%. Inexpensive humidity monitors can be purchased at most hardware stores. Do not use a humidifier as can make the problem worse and are not recommended.   Control of Cockroach Allergen Cockroach allergen has been identified as an important  cause of acute attacks of asthma, especially in urban settings.  There are fifty-five species of cockroach that exist in the [place], however only three, the [place], [place] species produce allergen that can affect patients with Asthma.  Allergens can be obtained from fecal particles, egg casings and secretions from cockroaches.    Remove food sources. Reduce access to water. Seal access and entry points. Spray runways with 0.5-1% Diazinon or Chlorpyrifos Blow boric acid power under stoves and refrigerator. Place bait stations (hydramethylnon) at feeding sites.

## 2022-07-03 ENCOUNTER — Ambulatory Visit (INDEPENDENT_AMBULATORY_CARE_PROVIDER_SITE_OTHER): Payer: 59 | Admitting: Family Medicine

## 2022-07-03 ENCOUNTER — Encounter: Payer: Self-pay | Admitting: Family Medicine

## 2022-07-03 ENCOUNTER — Other Ambulatory Visit (HOSPITAL_COMMUNITY): Payer: Self-pay

## 2022-07-03 VITALS — BP 126/70 | HR 81 | Temp 98.3°F | Resp 16 | Ht 62.0 in | Wt 253.5 lb

## 2022-07-03 DIAGNOSIS — R040 Epistaxis: Secondary | ICD-10-CM | POA: Insufficient documentation

## 2022-07-03 DIAGNOSIS — J302 Other seasonal allergic rhinitis: Secondary | ICD-10-CM

## 2022-07-03 DIAGNOSIS — K219 Gastro-esophageal reflux disease without esophagitis: Secondary | ICD-10-CM

## 2022-07-03 DIAGNOSIS — J4540 Moderate persistent asthma, uncomplicated: Secondary | ICD-10-CM | POA: Diagnosis not present

## 2022-07-03 DIAGNOSIS — J3089 Other allergic rhinitis: Secondary | ICD-10-CM | POA: Diagnosis not present

## 2022-07-03 MED ORDER — ARNUITY ELLIPTA 100 MCG/ACT IN AEPB
1.0000 | INHALATION_SPRAY | Freq: Every day | RESPIRATORY_TRACT | 2 refills | Status: DC
  Filled 2022-07-03: qty 30, 30d supply, fill #0

## 2022-07-06 ENCOUNTER — Other Ambulatory Visit (HOSPITAL_COMMUNITY): Payer: Self-pay

## 2022-07-10 ENCOUNTER — Ambulatory Visit: Payer: 59 | Admitting: Family Medicine

## 2022-07-15 ENCOUNTER — Encounter: Payer: Self-pay | Admitting: Nurse Practitioner

## 2022-07-15 ENCOUNTER — Ambulatory Visit: Payer: 59 | Admitting: Nurse Practitioner

## 2022-07-15 ENCOUNTER — Other Ambulatory Visit (HOSPITAL_COMMUNITY): Payer: Self-pay

## 2022-07-15 VITALS — BP 107/65 | HR 78 | Temp 97.8°F | Ht 62.0 in | Wt 255.0 lb

## 2022-07-15 DIAGNOSIS — G43109 Migraine with aura, not intractable, without status migrainosus: Secondary | ICD-10-CM

## 2022-07-15 NOTE — Progress Notes (Signed)
Acute Office Visit  Subjective:     Patient ID: Summer [name], female    DOB: 1976/09/29, 46 y.o.   MRN: [id number]  Chief Complaint  Patient presents with   Migraine    Pt states they are doing better , still having them off and on    Leg Pain    Lower right leg - stated it started Monday     Migraine  This is a chronic problem. The current episode started more than 1 month ago. The problem occurs constantly. The problem has been unchanged. The pain is located in the Bilateral region. The pain does not radiate. The pain quality is similar to prior headaches. The quality of the pain is described as aching and dull. The pain is at a severity of 5/10. The pain is moderate. Associated symptoms include nausea. Pertinent negatives include no abdominal pain, abnormal behavior, anorexia, back pain, fever, vomiting or weight loss.     Review of Systems  Constitutional: Negative.  Negative for chills, fever, malaise/fatigue and weight loss.  HENT: Negative.    Respiratory: Negative.    Cardiovascular: Negative.   Gastrointestinal:  Positive for nausea. Negative for abdominal pain, anorexia and vomiting.  Musculoskeletal:  Negative for back pain.  Skin: Negative.  Negative for itching and rash.  Neurological:  Positive for headaches. Negative for focal weakness.  All other systems reviewed and are negative.       Objective:    BP 107/65   Pulse 78   Temp 97.8 F (36.6 C)   Ht 5\' 2"  (1.575 m)   Wt 255 lb (115.7 kg)   SpO2 96%   BMI 46.64 kg/m  BP Readings from Last 3 Encounters:  07/15/22 107/65  07/03/22 126/70  05/22/22 121/77      Physical Exam Vitals and nursing note reviewed.  Constitutional:      Appearance: Normal appearance.  HENT:     Head: Normocephalic.     Right Ear: External ear normal.     Left Ear: External ear normal.     Nose: Nose normal.     Mouth/Throat:     Mouth: Mucous membranes are moist.  Eyes:     Conjunctiva/sclera: Conjunctivae  normal.  Cardiovascular:     Rate and Rhythm: Normal rate and regular rhythm.     Pulses: Normal pulses.     Heart sounds: Normal heart sounds.  Pulmonary:     Effort: Pulmonary effort is normal.     Breath sounds: Normal breath sounds.  Abdominal:     General: Bowel sounds are normal.  Skin:    Findings: No erythema or rash.  Neurological:     General: No focal deficit present.     Mental Status: She is alert and oriented to person, place, and time.  Psychiatric:        Mood and Affect: Mood normal.        Behavior: Behavior normal.     No results found for any visits on 07/15/22.      Assessment & Plan:  Migraine symptoms not well controlled on Imitrex and Topamax. Nurtec samples given to patient.  Education provided.  Referral to neurology completed/pending.  Patient is to follow-up with unresolved or worsening symptoms. Problem List Items Addressed This Visit       Cardiovascular and Mediastinum   Migraine with aura and without status migrainosus, not intractable - Primary   Relevant Orders   Ambulatory referral to Neurology    No  orders of the defined types were placed in this encounter.   Return if symptoms worsen or fail to improve.  Daryll Drown, NP

## 2022-07-15 NOTE — Patient Instructions (Signed)
Migraine Headache ?A migraine headache is a very strong throbbing pain on one side or both sides of your head. This type of headache can also cause other symptoms. It can last from 4 hours to 3 days. Talk with your doctor about what things may bring on (trigger) this condition. ?What are the causes? ?The exact cause of this condition is not known. This condition may be triggered or caused by: ?Drinking alcohol. ?Smoking. ?Taking medicines, such as: ?Medicine used to treat chest pain (nitroglycerin). ?Birth control pills. ?Estrogen. ?Some blood pressure medicines. ?Eating or drinking certain products. ?Doing physical activity. ?Other things that may trigger a migraine headache include: ?Having a menstrual period. ?Pregnancy. ?Hunger. ?Stress. ?Not getting enough sleep or getting too much sleep. ?Weather changes. ?Tiredness (fatigue). ?What increases the risk? ?Being 25-55 years old. ?Being female. ?Having a family history of migraine headaches. ?Being Caucasian. ?Having depression or anxiety. ?Being very overweight. ?What are the signs or symptoms? ?A throbbing pain. This pain may: ?Happen in any area of the head, such as on one side or both sides. ?Make it hard to do daily activities. ?Get worse with physical activity. ?Get worse around bright lights or loud noises. ?Other symptoms may include: ?Feeling sick to your stomach (nauseous). ?Vomiting. ?Dizziness. ?Being sensitive to bright lights, loud noises, or smells. ?Before you get a migraine headache, you may get warning signs (an aura). An aura may include: ?Seeing flashing lights or having blind spots. ?Seeing bright spots, halos, or zigzag lines. ?Having tunnel vision or blurred vision. ?Having numbness or a tingling feeling. ?Having trouble talking. ?Having weak muscles. ?Some people have symptoms after a migraine headache (postdromal phase), such as: ?Tiredness. ?Trouble thinking (concentrating). ?How is this treated? ?Taking medicines that: ?Relieve  pain. ?Relieve the feeling of being sick to your stomach. ?Prevent migraine headaches. ?Treatment may also include: ?Having acupuncture. ?Avoiding foods that bring on migraine headaches. ?Learning ways to control your body functions (biofeedback). ?Therapy to help you know and deal with negative thoughts (cognitive behavioral therapy). ?Follow these instructions at home: ?Medicines ?Take over-the-counter and prescription medicines only as told by your doctor. ?Ask your doctor if the medicine prescribed to you: ?Requires you to avoid driving or using heavy machinery. ?Can cause trouble pooping (constipation). You may need to take these steps to prevent or treat trouble pooping: ?Drink enough fluid to keep your pee (urine) pale yellow. ?Take over-the-counter or prescription medicines. ?Eat foods that are high in fiber. These include beans, whole grains, and fresh fruits and vegetables. ?Limit foods that are high in fat and sugar. These include fried or sweet foods. ?Lifestyle ?Do not drink alcohol. ?Do not use any products that contain nicotine or tobacco, such as cigarettes, e-cigarettes, and chewing tobacco. If you need help quitting, ask your doctor. ?Get at least 8 hours of sleep every night. ?Limit and deal with stress. ?General instructions ? ?  ? ?Keep a journal to find out what may bring on your migraine headaches. For example, write down: ?What you eat and drink. ?How much sleep you get. ?Any change in what you eat or drink. ?Any change in your medicines. ?If you have a migraine headache: ?Avoid things that make your symptoms worse, such as bright lights. ?It may help to lie down in a dark, quiet room. ?Do not drive or use heavy machinery. ?Ask your doctor what activities are safe for you. ?Keep all follow-up visits as told by your doctor. This is important. ?Contact a doctor if: ?You get a migraine   headache that is different or worse than others you have had. ?You have more than 15 headache days in one  month. ?Get help right away if: ?Your migraine headache gets very bad. ?Your migraine headache lasts longer than 72 hours. ?You have a fever. ?You have a stiff neck. ?You have trouble seeing. ?Your muscles feel weak or like you cannot control them. ?You start to lose your balance a lot. ?You start to have trouble walking. ?You pass out (faint). ?You have a seizure. ?Summary ?A migraine headache is a very strong throbbing pain on one side or both sides of your head. These headaches can also cause other symptoms. ?This condition may be treated with medicines and changes to your lifestyle. ?Keep a journal to find out what may bring on your migraine headaches. ?Contact a doctor if you get a migraine headache that is different or worse than others you have had. ?Contact your doctor if you have more than 15 headache days in a month. ?This information is not intended to replace advice given to you by your health care provider. Make sure you discuss any questions you have with your health care provider. ?Document Revised: 02/17/2019 Document Reviewed: 12/08/2018 ?Elsevier Patient Education ? 2023 Elsevier Inc. ? ?

## 2022-07-17 ENCOUNTER — Other Ambulatory Visit (HOSPITAL_COMMUNITY): Payer: Self-pay

## 2022-07-23 ENCOUNTER — Telehealth: Payer: Self-pay | Admitting: Nurse Practitioner

## 2022-07-26 ENCOUNTER — Other Ambulatory Visit: Payer: Self-pay | Admitting: Nurse Practitioner

## 2022-07-26 DIAGNOSIS — G43109 Migraine with aura, not intractable, without status migrainosus: Secondary | ICD-10-CM

## 2022-07-26 MED ORDER — NURTEC 75 MG PO TBDP
75.0000 mg | ORAL_TABLET | Freq: Once | ORAL | 2 refills | Status: DC | PRN
  Filled 2022-07-26: qty 18, 30d supply, fill #0
  Filled 2022-07-30: qty 16, 30d supply, fill #0
  Filled 2022-10-03: qty 16, 30d supply, fill #1
  Filled 2023-02-19 – 2023-03-27 (×2): qty 16, 30d supply, fill #2

## 2022-07-27 ENCOUNTER — Other Ambulatory Visit (HOSPITAL_COMMUNITY): Payer: Self-pay

## 2022-07-27 NOTE — Telephone Encounter (Signed)
I sent refill to [facility]

## 2022-07-28 ENCOUNTER — Other Ambulatory Visit (HOSPITAL_COMMUNITY): Payer: Self-pay

## 2022-07-29 ENCOUNTER — Telehealth: Payer: Self-pay | Admitting: *Deleted

## 2022-07-29 NOTE — Telephone Encounter (Signed)
PA for Nurtec-In Process  Key: [id number]   PA Case ID: [id number]   [employer] is reviewing your PA request. You may close this dialog, return to your dashboard, and perform other tasks.  To check for an update later, open this request again from your dashboard. If [employer] has not replied within 24 hours for urgent requests or within 48 hours for standard requests, please contact [employer] at [phone].

## 2022-07-30 ENCOUNTER — Other Ambulatory Visit (HOSPITAL_COMMUNITY): Payer: Self-pay

## 2022-07-30 NOTE — Telephone Encounter (Signed)
Approvedtoday The request has been approved. The authorization is effective from 07/30/2022 to 01/26/2023, as long as the member is enrolled in their current health plan. The request was approved with a quantity restriction. This request has been approved for 6 months with a quantity limit of 16 tablets per 30 days. A written notification letter will follow with additional details.

## 2022-08-03 ENCOUNTER — Other Ambulatory Visit (HOSPITAL_COMMUNITY): Payer: Self-pay

## 2022-08-06 ENCOUNTER — Telehealth: Payer: 59 | Admitting: Physician Assistant

## 2022-08-06 DIAGNOSIS — B353 Tinea pedis: Secondary | ICD-10-CM | POA: Diagnosis not present

## 2022-08-06 MED ORDER — FLUCONAZOLE 150 MG PO TABS: 150.0000 mg | ORAL_TABLET | ORAL | 0 refills | Status: DC

## 2022-08-06 NOTE — Progress Notes (Signed)
E-Visit for Athlete's Foot  We are sorry that you are not feeling well. Here is how we plan to help!  Based on what you shared with me it looks like you have tinea pedis, or "Athlete's Foot".  This type of rash can spread through shared towels, clothing, bedding, etc., as well as hard surfaces (particularly in moist areas) such as shower stalls, locker room floors, pool areas, etc. The symptoms of Athlete's Foot include red, swollen, peeling, itchy skin between the toes (especially between the pinky toe and the one next to it). The sole and heel of the foot may also be affected. In severe cases, the skin on the feet can blister.  Athlete's foot can usually be treated with over-the-counter topical antifungal products; but sometimes with chronic or extensive tinea pedis, prescription oral medications are needed.   I am recommending:Clotrimazole 1% cream or gel, apply to area twice per day   Prescription medications are only indicated for an extensive rash or if over the counter treatments have failed.  I am prescribing:Fluconazole 150 mg once weekly for two to four weeks  HOME CARE:  Keep feet clean, dry, and cool. Avoid using swimming pools, public showers, or foot baths. Wear sandals when possible or air shoes out by alternating them every 2-3 days. Avoid wearing closed shoes and wearing socks made from fabric that doesn't dry easily (for example, nylon). Treat the infection with recommended medication  GET HELP RIGHT AWAY IF:  Symptoms that don't go away after treatment. Severe itching that persists. If your rash spreads or swells. If your rash begins to have drainage or smell. You develop a fever.  MAKE SURE YOU   Understand these instructions. Will watch your condition. Will get help right away if you are not doing well or get worse.   Thank you for choosing an e-visit.  Your e-visit answers were reviewed by a board certified advanced clinical practitioner to  complete your personal care plan. Depending upon the condition, your plan could have included both over the counter or prescription medications.  Please review your pharmacy choice. Make sure the pharmacy is open so you can pick up prescription now. If there is a problem, you may contact your provider through [employer] and have the prescription routed to another pharmacy.  Your safety is important to [place]. If you have drug allergies check your prescription carefully.   For the next 24 hours you can use MyChart to ask questions about today's visit, request a non-urgent call back, or ask for a work or school excuse.  You will get an email in the next two days asking about your experience. I hope that your e-visit has been valuable and will speed your recovery  References or for more information:  [email or web address]=search_[email or web address]=1~104&usage_type=default&display_rank=1  [email or web address]

## 2022-08-10 NOTE — Progress Notes (Signed)
I have spent 5 minutes in review of e-visit questionnaire, review and updating patient chart, medical decision making and response to patient.   Summer [name] Cody Logen Fowle, PA-C    

## 2022-09-10 ENCOUNTER — Other Ambulatory Visit (HOSPITAL_COMMUNITY): Payer: Self-pay

## 2022-09-10 ENCOUNTER — Encounter: Payer: Self-pay | Admitting: Neurology

## 2022-09-10 ENCOUNTER — Ambulatory Visit: Payer: 59 | Admitting: Neurology

## 2022-09-10 VITALS — BP 108/68 | HR 71 | Ht 63.0 in | Wt 250.0 lb

## 2022-09-10 DIAGNOSIS — G43709 Chronic migraine without aura, not intractable, without status migrainosus: Secondary | ICD-10-CM | POA: Diagnosis not present

## 2022-09-10 MED ORDER — AJOVY 225 MG/1.5ML ~~LOC~~ SOAJ
225.0000 mg | SUBCUTANEOUS | 11 refills | Status: DC
  Filled 2022-09-10: qty 1.5, 30d supply, fill #0
  Filled 2022-11-13: qty 3, 60d supply, fill #1
  Filled 2023-01-05: qty 3, 60d supply, fill #2
  Filled 2023-02-19 – 2023-02-22 (×2): qty 3, 60d supply, fill #3
  Filled 2023-05-01: qty 1.5, 28d supply, fill #4
  Filled 2023-09-06 (×2): qty 1.5, 28d supply, fill #5

## 2022-09-10 NOTE — Progress Notes (Signed)
[facility]    Provider:  Dr Lucia Gaskins Requesting Provider: Daryll Drown, NP Primary Care Provider:  Daryll Drown, NP  CC:  migraines  HPI:  Summer [name] is a 46 y.o. female here as requested by Daryll Drown, NP for headaches. Pmhx migraines, obesity, hld,gad. She has had migraines for years. She has daily headaches, she has at least 10 migraine days a month. 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 Migraines are moderate to severe, pulsating/pounding/throbbing,photo/phonophonophobia, can be unilateral, hurts to move, nausea, can last up to 24 hours even with meds, no aura, no medication overuse. No weakness, nurtec helps acutely. Not really positional or exertional but worsening. Ongoing at this severity, affecting life for over a year. No other focal neurologic deficits, associated symptoms, inciting events or modifiable factors.   Reviewed notes, labs and imaging from outside physicians, which showed:  05/22/2022: tsh, cbc nml cmp unremarkable   05/21/2020: MRI brain CLINICAL DATA:  Headache, acute, normal neuro exam. Additional provided: Patient sent by PCP for migraine headache which began on Sunday.   EXAM: CT HEAD WITHOUT CONTRAST   TECHNIQUE: Contiguous axial images were obtained from the base of the skull through the vertex without intravenous contrast.   COMPARISON:  No pertinent prior studies available for comparison.   FINDINGS: Brain:   Cerebral volume is normal.   There is no acute intracranial hemorrhage.   No demarcated cortical infarct.   No extra-axial fluid collection.   No evidence of intracranial mass.   No midline shift.   Vascular: No hyperdense vessel.   Skull: Normal. Negative for fracture or focal lesion.   Sinuses/Orbits: Visualized orbits show no acute finding. Minimal ethmoid sinus mucosal thickening. No significant mastoid effusion.   IMPRESSION: Unremarkable non-contrast CT  appearance of the brain. No evidence of acute intracranial abnormality.   Minimal ethmoid sinus mucosal thickening at the imaged levels.  From a thorough review of records, medications tried that can be used in migraine management include Tylenol, aspirin, citalopram, Flexeril, Topamax, Nurtec, Decadron, Benadryl, Prozac, ibuprofen, ketorolac, Robaxin, Medrol tablets, methylprednisolone injections, metoprolol, amitriptyline, naproxen, Zofran, prednisone tablets, Compazine, Phenergan, Nurtec, Imitrex, Maxalt, aimovig contraindicated due to constipation.   Review of Systems: Patient complains of symptoms per HPI as well as the following symptoms migraines. Pertinent negatives and positives per HPI. All others negative.   Social History   Socioeconomic History   Marital status: Married    Spouse name: Not on file   Number of children: Not on file   Years of education: Not on file   Highest education level: Not on file  Occupational History   Not on file  Tobacco Use   Smoking status: Former    Packs/day: 0.50    Years: 3.00    Total pack years: 1.50    Types: Cigarettes    Quit date: 02/20/1998    Years since quitting: 24.5   Smokeless tobacco: Never  Vaping Use   Vaping Use: Never used  Substance and Sexual Activity   Alcohol use: No   Drug use: No   Sexual activity: Yes    Birth control/protection: Surgical  Other Topics Concern   Not on file  Social History Narrative   Not on file   Social Determinants of Health   Financial Resource Strain: Not on file  Food Insecurity: Not on file  Transportation Needs: Not on file  Physical Activity: Not on file  Stress: Not on file  Social Connections: Not on file  Intimate Partner Violence: Not on file    Family History  Problem Relation Age of Onset   Hypertension Mother    Hypothyroidism Mother    Heart disease Mother    Colon cancer Mother 77   Ovarian cancer Mother    Heart disease Father    Asthma Daughter     Asthma Son    Breast cancer Cousin    Migraines Neg Hx    Headache Neg Hx     Past Medical History:  Diagnosis Date   Angio-edema    Anxiety    Asthma    GERD (gastroesophageal reflux disease)    Mixed hyperlipidemia 03/27/2021    Patient Active Problem List   Diagnosis Date Noted   Chronic migraine without aura without status migrainosus, not intractable 09/10/2022   Not well controlled moderate persistent asthma 07/03/2022   Seasonal and perennial allergic rhinitis 07/03/2022   Epistaxis 07/03/2022   Subacute cough 10/01/2021   Tachycardia 05/10/2021   Muscle pain 03/31/2021   Seasonal allergies 03/31/2021   Cold sore 03/31/2021   Mild intermittent asthma without complication 03/31/2021   Mixed hyperlipidemia 03/27/2021   Migraine with aura and without status migrainosus, not intractable 08/19/2017   Subclinical hypothyroidism 07/07/2017   GAD (generalized anxiety disorder) 01/17/2016   Gastroesophageal reflux disease 01/17/2016   Morbid obesity (HCC) 01/17/2016    Past Surgical History:  Procedure Laterality Date   CHOLECYSTECTOMY N/A 02/21/2014   Procedure: LAPAROSCOPIC CHOLECYSTECTOMY;  Surgeon: Dalia Heading, MD;  Location: [facility];  Service: General;  Laterality: N/A;   GALLBLADDER SURGERY     TUBAL LIGATION     TYMPANOSTOMY TUBE PLACEMENT Bilateral     Current Outpatient Medications  Medication Sig Dispense Refill   albuterol (VENTOLIN HFA) 108 (90 Base) MCG/ACT inhaler Inhale 2 puffs into the lungs every 6 (six) hours as needed. 18 g 2   fluconazole (DIFLUCAN) 150 MG tablet Take 1 tablet (150 mg total) by mouth once a week. 3 tablet 0   FLUoxetine (PROZAC) 20 MG capsule Take 1 capsule by mouth daily. 90 capsule 1   fluticasone (FLONASE) 50 MCG/ACT nasal spray Place 2 sprays into both nostrils daily. 16 g 6   Fluticasone Furoate (ARNUITY ELLIPTA) 100 MCG/ACT AEPB Inhale 1 puff into the lungs daily at 6 (six) AM. 30 each 2   Fremanezumab-vfrm (AJOVY) 225  MG/1.5ML SOAJ Inject 225 mg into the skin every 30 (thirty) days. 1.5 mL 11   ibuprofen (ADVIL) 800 MG tablet TAKE 1 TABLET BY MOUTH 3 TIMES DAILY 270 tablet 0   levocetirizine (XYZAL) 5 MG tablet Take 1 tablet (5 mg total) by mouth every evening. 30 tablet 5   levothyroxine (SYNTHROID) 50 MCG tablet Take 1 tablet by mouth daily. 90 tablet 1   methocarbamol (ROBAXIN) 500 MG tablet Take 1 tablet (500 mg total) by mouth 4 (four) times daily. (Patient taking differently: Take 500 mg by mouth as needed.) 30 tablet 0   metoprolol tartrate (LOPRESSOR) 25 MG tablet Take 1/2 tablet by mouth 2 times daily as needed (high heart rate). 180 tablet 1   montelukast (SINGULAIR) 10 MG tablet Take 1 tablet by mouth at bedtime. 90 tablet 1   omeprazole (PRILOSEC) 20 MG capsule Take 1 capsule by mouth daily. 90 capsule 1   Rimegepant Sulfate (NURTEC) 75 MG TBDP Dissolve one tablet by mouth once as needed for up to 1 dose. Not to exceed one tablet per 24-hr period. Not  to exceed 18 doses/30 days 30 tablet 2   topiramate (TOPAMAX) 50 MG tablet Take 1 and 1/2 tablets by mouth 2 times daily. 270 tablet 1   valACYclovir (VALTREX) 1000 MG tablet Take 2 tablets by mouth every 12 hours for 1 day at onset of symptoms of a cold sore. Take as needed. 30 tablet 1   No current facility-administered medications for this visit.    Allergies as of 09/10/2022 - Review Complete 09/10/2022  Allergen Reaction Noted   Bee pollen  04/13/2022   Molds & smuts  04/13/2022   Tramadol  02/17/2014    Vitals: BP 108/68   Pulse 71   Ht 5\' 3"  (1.6 m)   Wt 250 lb (113.4 kg)   BMI 44.29 kg/m  Last Weight:  Wt Readings from Last 1 Encounters:  09/10/22 250 lb (113.4 kg)   Last Height:   Ht Readings from Last 1 Encounters:  09/10/22 5\' 3"  (1.6 m)     Physical exam: Exam: Gen: NAD, conversant, well nourised, obese, well groomed                     CV: RRR, no MRG. No Carotid Bruits. No peripheral edema, warm, nontender Eyes:  Conjunctivae clear without exudates or hemorrhage  Neuro: Detailed Neurologic Exam  Speech:    Speech is normal; fluent and spontaneous with normal comprehension.  Cognition:    The patient is oriented to person, place, and time;     recent and remote memory intact;     language fluent;     normal attention, concentration,     fund of knowledge Cranial Nerves:    The pupils are equal, round, and reactive to light. The fundi are normal and spontaneous venous pulsations are present. Visual fields are full to finger confrontation. Extraocular movements are intact. Trigeminal sensation is intact and the muscles of mastication are normal. The face is symmetric. The palate elevates in the midline. Hearing intact. Voice is normal. Shoulder shrug is normal. The tongue has normal motion without fasciculations.   Coordination:    Normal    Gait:     normal.   Motor Observation:    No asymmetry, no atrophy, and no involuntary movements noted. Tone:    Normal muscle tone.    Posture:    Posture is normal. normal erect    Strength:    Strength is V/V in the upper and lower limbs.      Sensation: intact to LT     Reflex Exam:  DTR's:    Deep tendon reflexes in the upper and lower extremities are normal bilaterally.   Toes:    The toes are downgoing bilaterally.   Clonus:    Clonus is absent.    Assessment/Plan:  patient with chronic migraines  Discussed MRi brain, decided to hold off, CT showed some possible sinusitis ethmoid may consider in the future Start ajovy  Discussed: To prevent or relieve headaches, try the following: Cool Compress. Lie down and place a cool compress on your head.  Avoid headache triggers. If certain foods or odors seem to have triggered your migraines in the past, avoid them. A headache diary might help you identify triggers.  Include physical activity in your daily routine. Try a daily walk or other moderate aerobic exercise.  Manage stress. Find  healthy ways to cope with the stressors, such as delegating tasks on your to-do list.  Practice relaxation techniques. Try deep breathing, yoga, massage and  visualization.  Eat regularly. Eating regularly scheduled meals and maintaining a healthy diet might help prevent headaches. Also, drink plenty of fluids.  Follow a regular sleep schedule. Sleep deprivation might contribute to headaches Consider biofeedback. With this mind-body technique, you learn to control certain bodily functions -- such as muscle tension, heart rate and blood pressure -- to prevent headaches or reduce headache pain.    Proceed to emergency room if you experience new or worsening symptoms or symptoms do not resolve, if you have new neurologic symptoms or if headache is severe, or for any concerning symptom.     No orders of the defined types were placed in this encounter.  Meds ordered this encounter  Medications   Fremanezumab-vfrm (AJOVY) 225 MG/1.5ML SOAJ    Sig: Inject 225 mg into the skin every 30 (thirty) days.    Dispense:  1.5 mL    Refill:  11    Cc: Daryll Drown, NP,  Daryll Drown, NP  Naomie Dean, MD  [facility] [address] [place], [place] [address]  Phone [phone] Fax [phone]

## 2022-09-10 NOTE — Patient Instructions (Signed)
Nurtec as needed Ajovy monthy for prevention  Fremanezumab Injection What is this medication? FREMANEZUMAB (fre ma NEZ ue mab) prevents migraines. It works by blocking a substance in the body that causes migraines. It is a monoclonal antibody. This medicine may be used for other purposes; ask your health care provider or pharmacist if you have questions. COMMON BRAND NAME(S): AJOVY What should I tell my care team before I take this medication? They need to know if you have any of these conditions: An unusual or allergic reaction to fremanezumab, other medications, foods, dyes, or preservatives Pregnant or trying to get pregnant Breast-feeding How should I use this medication? This medication is injected under the skin. You will be taught how to prepare and give it. Take it as directed on the prescription label. Keep taking it unless your care team tells you to stop. It is important that you put your used needles and syringes in a special sharps container. Do not put them in a trash can. If you do not have a sharps container, call your pharmacist or care team to get one. Talk to your care team about the use of this medication in children. Special care may be needed. Overdosage: If you think you have taken too much of this medicine contact a poison control center or emergency room at once. NOTE: This medicine is only for you. Do not share this medicine with others. What if I miss a dose? If you miss a dose, take it as soon as you can. If it is almost time for your next dose, take only that dose. Do not take double or extra doses. What may interact with this medication? Interactions are not expected. This list may not describe all possible interactions. Give your health care provider a list of all the medicines, herbs, non-prescription drugs, or dietary supplements you use. Also tell them if you smoke, drink alcohol, or use illegal drugs. Some items may interact with your medicine. What should I  watch for while using this medication? Tell your care team if your symptoms do not start to get better or if they get worse. What side effects may I notice from receiving this medication? Side effects that you should report to your care team as soon as possible: Allergic reactions or angioedema--skin rash, itching or hives, swelling of the face, eyes, lips, tongue, arms, or legs, trouble swallowing or breathing Side effects that usually do not require medical attention (report to your care team if they continue or are bothersome): Pain, redness, or irritation at injection site This list may not describe all possible side effects. Call your doctor for medical advice about side effects. You may report side effects to FDA at 1-800-FDA-1088. Where should I keep my medication? Keep out of the reach of children and pets. Store in a refrigerator or at room temperature between 20 and 25 degrees C (68 and 77 degrees F). Refrigeration (preferred): Store in the refrigerator. Do not freeze. Keep in the original container until you are ready to take it. Remove the dose from the carton about 30 minutes before it is time for you to use it. If the dose is not used, it may be stored in the original container at room temperature for 7 days. Get rid of any unused medication after the expiration date. Room Temperature: This medication may be stored at room temperature for up to 7 days. Keep it in the original container. Protect from light until time of use. If it is stored at  room temperature, get rid of any unused medication after 7 days or after it expires, whichever is first. To get rid of medications that are no longer needed or have expired: Take the medication to a medication take-back program. Check with your pharmacy or law enforcement to find a location. If you cannot return the medication, ask your pharmacist or care team how to get rid of this medication safely. NOTE: This sheet is a summary. It may not cover  all possible information. If you have questions about this medicine, talk to your doctor, pharmacist, or health care provider.  2023 Elsevier/Gold Standard (2017-07-27 00:00:00)

## 2022-09-14 ENCOUNTER — Other Ambulatory Visit (HOSPITAL_COMMUNITY): Payer: Self-pay

## 2022-09-15 ENCOUNTER — Telehealth: Payer: Self-pay | Admitting: *Deleted

## 2022-09-15 NOTE — Telephone Encounter (Signed)
Ajovy PA, Key: [id number] Your information has been sent to [employer]

## 2022-09-16 ENCOUNTER — Other Ambulatory Visit (HOSPITAL_COMMUNITY): Payer: Self-pay

## 2022-09-17 ENCOUNTER — Other Ambulatory Visit (HOSPITAL_COMMUNITY): Payer: Self-pay

## 2022-09-17 ENCOUNTER — Encounter (HOSPITAL_COMMUNITY): Payer: Self-pay

## 2022-09-17 NOTE — Telephone Encounter (Signed)
Ajovy approved, effective from 09/17/2022 to 03/16/2023. Approval faxed to pharmacy.

## 2022-09-18 ENCOUNTER — Other Ambulatory Visit (HOSPITAL_COMMUNITY): Payer: Self-pay

## 2022-09-19 ENCOUNTER — Other Ambulatory Visit (HOSPITAL_COMMUNITY): Payer: Self-pay

## 2022-10-03 ENCOUNTER — Other Ambulatory Visit (HOSPITAL_COMMUNITY): Payer: Self-pay

## 2022-10-09 ENCOUNTER — Ambulatory Visit: Payer: 59 | Admitting: Family Medicine

## 2022-10-14 ENCOUNTER — Ambulatory Visit: Payer: 59 | Admitting: Family Medicine

## 2022-10-14 ENCOUNTER — Telehealth: Payer: Self-pay | Admitting: Family Medicine

## 2022-10-14 DIAGNOSIS — J309 Allergic rhinitis, unspecified: Secondary | ICD-10-CM

## 2022-10-14 NOTE — Progress Notes (Deleted)
   [address] [place] [address] Dept: [phone]  FOLLOW UP NOTE  Patient ID: Summer [name], female    DOB: [birth year]  Age: 46 y.o. MRN: [id number] Date of Office Visit: 10/14/2022  Assessment  Chief Complaint: No chief complaint on file.  HPI Summer [name] is a 46 year old female who presents the clinic for follow-up visit.  She was last seen in this clinic on 07/03/2022 by Thermon Leyland, FNP, for evaluation ofevaluation of asthma, allergic rhinitis and, reflux controlled with omeprazole.  Her problem list is significant for  migraine, and hypothyroidism on Synthroid.   Drug Allergies:  Allergies  Allergen Reactions   Bee Pollen    Molds & Smuts    Tramadol     Vomiting and nausea    Physical Exam: There were no vitals taken for this visit.   Physical Exam  Diagnostics:    Assessment and Plan: No diagnosis found.  No orders of the defined types were placed in this encounter.   There are no Patient Instructions on file for this visit.  No follow-ups on file.    Thank you for the opportunity to care for this patient.  Please do not hesitate to contact me with questions.  Thermon Leyland, FNP [facility]

## 2022-10-14 NOTE — Telephone Encounter (Signed)
LMOM for patient to call the clinic to reschedule missed appointment

## 2022-10-14 NOTE — Patient Instructions (Incomplete)
Asthma Begin Arnuity 1 puff once a day to prevent cough or wheeze  Continue montelukast 10 mg once a day to prevent cough or wheeze You may use albuterol 2 puffs once every 4 hours as needed for cough or wheeze You may use albuterol 2 puffs 5 to 15 minutes before activity to decrease cough or wheeze  Allergic rhinitis Continue continue avoidance measures directed toward pollen, mold, dust mite, and cockroach as listed below Continue Xyzal 5 mg once a day as needed for runny nose or itch Continue Flonase 2 sprays in each nostril once a day as needed for a stuffy nose.  In the right nostril, point the applicator out toward the right ear. In the left nostril, point the applicator out toward the left ear Consider saline nasal rinses as needed for nasal symptoms. Use this before any medicated nasal sprays for best result Consider allergen immunotherapy if your symptoms are not well controlled. Call the clinic if you are interested in beginning allergy injections. Make an appointment for your first injection about 2-3 weeks before you are ready to start.  Written information provided  Reflux Continue dietary and lifestyle modifications as listed below Continue omeprazole as previously prescribed  Epistaxis Pinch both nostrils while leaning forward for at least 5 minutes before checking to see if the bleeding has stopped. If bleeding is not controlled within 5-10 minutes apply a cotton ball soaked with oxymetazoline (Afrin) to the bleeding nostril for a few seconds.  If the problem persists or worsens a referral to ENT for further evaluation may be necessary.  Call the clinic if this treatment plan is not working well for you.  Follow up in 3 months or sooner if needed.   Reducing Pollen Exposure The American Academy of Allergy, Asthma and Immunology suggests the following steps to reduce your exposure to pollen during allergy seasons. Do not hang sheets or clothing out to dry; pollen may  collect on these items. Do not mow lawns or spend time around freshly cut grass; mowing stirs up pollen. Keep windows closed at night.  Keep car windows closed while driving. Minimize morning activities outdoors, a time when pollen counts are usually at their highest. Stay indoors as much as possible when pollen counts or humidity is high and on windy days when pollen tends to remain in the air longer. Use air conditioning when possible.  Many air conditioners have filters that trap the pollen spores. Use a HEPA room air filter to remove pollen form the indoor air you breathe.  Control of Mold Allergen Mold and fungi can grow on a variety of surfaces provided certain temperature and moisture conditions exist.  Outdoor molds grow on plants, decaying vegetation and soil.  The major outdoor mold, Alternaria and Cladosporium, are found in very high numbers during hot and dry conditions.  Generally, a late Summer - Fall peak is seen for common outdoor fungal spores.  Rain will temporarily lower outdoor mold spore count, but counts rise rapidly when the rainy period ends.  The most important indoor molds are Aspergillus and Penicillium.  Dark, humid and poorly ventilated basements are ideal sites for mold growth.  The next most common sites of mold growth are the bathroom and the kitchen.  Outdoor [employer] Use air conditioning and keep windows closed Avoid exposure to decaying vegetation. Avoid leaf raking. Avoid grain handling. Consider wearing a face mask if working in moldy areas.  Indoor Mold Control Maintain humidity below 50%. Clean washable surfaces with  5% bleach solution. Remove sources e.g. Contaminated carpets.   Control of Dust Mite Allergen Dust mites play a major role in allergic asthma and rhinitis. They occur in environments with high humidity wherever human skin is found. Dust mites absorb humidity from the atmosphere (ie, they do not drink) and feed on organic matter  (including shed human and animal skin). Dust mites are a microscopic type of insect that you cannot see with the naked eye. High levels of dust mites have been detected from mattresses, pillows, carpets, upholstered furniture, bed covers, clothes, soft toys and any woven material. The principal allergen of the dust mite is found in its feces. A gram of dust may contain 1,000 mites and 250,000 fecal particles. Mite antigen is easily measured in the air during house cleaning activities. Dust mites do not bite and do not cause harm to humans, other than by triggering allergies/asthma.  Ways to decrease your exposure to dust mites in your home:  1. Encase mattresses, box springs and pillows with a mite-impermeable barrier or cover  2. Wash sheets, blankets and drapes weekly in hot water (130 F) with detergent and dry them in a dryer on the hot setting.  3. Have the room cleaned frequently with a vacuum cleaner and a damp dust-mop. For carpeting or rugs, vacuuming with a vacuum cleaner equipped with a high-efficiency particulate air (HEPA) filter. The dust mite allergic individual should not be in a room which is being cleaned and should wait 1 hour after cleaning before going into the room.  4. Do not sleep on upholstered furniture (eg, couches).  5. If possible removing carpeting, upholstered furniture and drapery from the home is ideal. Horizontal blinds should be eliminated in the rooms where the person spends the most time (bedroom, study, television room). Washable vinyl, roller-type shades are optimal.  6. Remove all non-washable stuffed toys from the bedroom. Wash stuffed toys weekly like sheets and blankets above.  7. Reduce indoor humidity to less than 50%. Inexpensive humidity monitors can be purchased at most hardware stores. Do not use a humidifier as can make the problem worse and are not recommended.   Control of Cockroach Allergen Cockroach allergen has been identified as an important  cause of acute attacks of asthma, especially in urban settings.  There are fifty-five species of cockroach that exist in the [place], however only three, the American, German and Oriental species produce allergen that can affect patients with Asthma.  Allergens can be obtained from fecal particles, egg casings and secretions from cockroaches.    Remove food sources. Reduce access to water. Seal access and entry points. Spray runways with 0.5-1% Diazinon or Chlorpyrifos Blow boric acid power under stoves and refrigerator. Place bait stations (hydramethylnon) at feeding sites.   

## 2022-11-13 ENCOUNTER — Other Ambulatory Visit (HOSPITAL_COMMUNITY): Payer: Self-pay

## 2023-01-05 ENCOUNTER — Other Ambulatory Visit: Payer: Self-pay | Admitting: Family Medicine

## 2023-01-05 ENCOUNTER — Other Ambulatory Visit (HOSPITAL_COMMUNITY): Payer: Self-pay

## 2023-01-05 DIAGNOSIS — G43109 Migraine with aura, not intractable, without status migrainosus: Secondary | ICD-10-CM

## 2023-01-06 ENCOUNTER — Other Ambulatory Visit (HOSPITAL_COMMUNITY): Payer: Self-pay

## 2023-01-06 NOTE — Telephone Encounter (Signed)
See note from 01/06/2023 at 9:43am

## 2023-01-06 NOTE — Telephone Encounter (Signed)
Apt scheduled for 03/12/2023 JE PT [employer] insurance ok to schedule out till May. Can pt get refill till apt?

## 2023-01-06 NOTE — Telephone Encounter (Signed)
Je pt NTBS by new provider. RF NOT sent

## 2023-01-18 ENCOUNTER — Telehealth: Payer: Commercial Managed Care - PPO | Admitting: Neurology

## 2023-01-18 ENCOUNTER — Encounter: Payer: Self-pay | Admitting: Neurology

## 2023-01-18 NOTE — Progress Notes (Deleted)
[id number]    Provider:  Dr Jaynee Eagles Requesting Provider: Ivy Lynn, NP Primary Care Provider:  Ivy Lynn, NP (Inactive)  CC:  migraines  HPI:  Summer [name] is a 47 y.o. female here as requested by Ivy Lynn, NP for headaches. Pmhx migraines, obesity, hld,gad. She has had migraines for years. She has daily headaches, she has at least 10 migraine days a month. 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 Migraines are moderate to severe, pulsating/pounding/throbbing,photo/phonophonophobia, can be unilateral, hurts to move, nausea, can last up to 24 hours even with meds, no aura, no medication overuse. No weakness, nurtec helps acutely. Not really positional or exertional but worsening. Ongoing at this severity, affecting life for over a year. No other focal neurologic deficits, associated symptoms, inciting events or modifiable factors.   Reviewed notes, labs and imaging from outside physicians, which showed:  05/22/2022: tsh, cbc nml cmp unremarkable   05/21/2020: MRI brain CLINICAL DATA:  Headache, acute, normal neuro exam. Additional provided: Patient sent by PCP for migraine headache which began on Sunday.   EXAM: CT HEAD WITHOUT CONTRAST   TECHNIQUE: Contiguous axial images were obtained from the base of the skull through the vertex without intravenous contrast.   COMPARISON:  No pertinent prior studies available for comparison.   FINDINGS: Brain:   Cerebral volume is normal.   There is no acute intracranial hemorrhage.   No demarcated cortical infarct.   No extra-axial fluid collection.   No evidence of intracranial mass.   No midline shift.   Vascular: No hyperdense vessel.   Skull: Normal. Negative for fracture or focal lesion.   Sinuses/Orbits: Visualized orbits show no acute finding. Minimal ethmoid sinus mucosal thickening. No significant mastoid effusion.   IMPRESSION: Unremarkable  non-contrast CT appearance of the brain. No evidence of acute intracranial abnormality.   Minimal ethmoid sinus mucosal thickening at the imaged levels.  From a thorough review of records, medications tried that can be used in migraine management include Tylenol, aspirin, citalopram, Flexeril, Topamax, Nurtec, Decadron, Benadryl, Prozac, ibuprofen, ketorolac, Robaxin, Medrol tablets, methylprednisolone injections, metoprolol, amitriptyline, naproxen, Zofran, prednisone tablets, Compazine, Phenergan, Nurtec, Imitrex, Maxalt, aimovig contraindicated due to constipation.   Review of Systems: Patient complains of symptoms per HPI as well as the following symptoms migraines. Pertinent negatives and positives per HPI. All others negative.   Social History   Socioeconomic History   Marital status: Married    Spouse name: Not on file   Number of children: Not on file   Years of education: Not on file   Highest education level: Not on file  Occupational History   Not on file  Tobacco Use   Smoking status: Former    Packs/day: 0.50    Years: 3.00    Total pack years: 1.50    Types: Cigarettes    Quit date: 02/20/1998    Years since quitting: 24.9   Smokeless tobacco: Never  Vaping Use   Vaping Use: Never used  Substance and Sexual Activity   Alcohol use: No   Drug use: No   Sexual activity: Yes    Birth control/protection: Surgical  Other Topics Concern   Not on file  Social History Narrative   Not on file   Social Determinants of Health   Financial Resource Strain: Not on file  Food Insecurity: Not on file  Transportation Needs: Not on file  Physical Activity: Not on file  Stress: Not on  file  Social Connections: Not on file  Intimate Partner Violence: Not on file    Family History  Problem Relation Age of Onset   Hypertension Mother    Hypothyroidism Mother    Heart disease Mother    Colon cancer Mother 81   Ovarian cancer Mother    Heart disease Father    Asthma  Daughter    Asthma Son    Breast cancer Cousin    Migraines Neg Hx    Headache Neg Hx     Past Medical History:  Diagnosis Date   Angio-edema    Anxiety    Asthma    GERD (gastroesophageal reflux disease)    Mixed hyperlipidemia 03/27/2021    Patient Active Problem List   Diagnosis Date Noted   Chronic migraine without aura without status migrainosus, not intractable 09/10/2022   Not well controlled moderate persistent asthma 07/03/2022   Seasonal and perennial allergic rhinitis 07/03/2022   Epistaxis 07/03/2022   Subacute cough 10/01/2021   Tachycardia 05/10/2021   Muscle pain 03/31/2021   Seasonal allergies 03/31/2021   Cold sore 03/31/2021   Mild intermittent asthma without complication [id number]   Mixed hyperlipidemia 03/27/2021   Migraine with aura and without status migrainosus, not intractable 08/19/2017   Subclinical hypothyroidism 07/07/2017   GAD (generalized anxiety disorder) 01/17/2016   Gastroesophageal reflux disease 01/17/2016   Morbid obesity ([place]) 01/17/2016    Past Surgical History:  Procedure Laterality Date   CHOLECYSTECTOMY N/A 02/21/2014   Procedure: LAPAROSCOPIC CHOLECYSTECTOMY;  Surgeon: Jamesetta So, MD;  Location: [facility];  Service: General;  Laterality: N/A;   GALLBLADDER SURGERY     TUBAL LIGATION     TYMPANOSTOMY TUBE PLACEMENT Bilateral     Current Outpatient Medications  Medication Sig Dispense Refill   albuterol (VENTOLIN HFA) 108 (90 Base) MCG/ACT inhaler Inhale 2 puffs into the lungs every 6 (six) hours as needed. 18 g 2   fluconazole (DIFLUCAN) 150 MG tablet Take 1 tablet (150 mg total) by mouth once a week. 3 tablet 0   FLUoxetine (PROZAC) 20 MG capsule Take 1 capsule by mouth daily. 90 capsule 1   fluticasone (FLONASE) 50 MCG/ACT nasal spray Place 2 sprays into both nostrils daily. 16 g 6   Fluticasone Furoate (ARNUITY ELLIPTA) 100 MCG/ACT AEPB Inhale 1 puff into the lungs daily at 6 (six) AM. 30 each 2   Fremanezumab-vfrm  (AJOVY) 225 MG/1.5ML SOAJ Inject 225 mg into the skin every 30 (thirty) days. 1.5 mL 11   ibuprofen (ADVIL) 800 MG tablet TAKE 1 TABLET BY MOUTH 3 TIMES DAILY 270 tablet 0   levocetirizine (XYZAL) 5 MG tablet Take 1 tablet (5 mg total) by mouth every evening. 30 tablet 5   levothyroxine (SYNTHROID) 50 MCG tablet Take 1 tablet by mouth daily. 90 tablet 1   methocarbamol (ROBAXIN) 500 MG tablet Take 1 tablet (500 mg total) by mouth 4 (four) times daily. (Patient taking differently: Take 500 mg by mouth as needed.) 30 tablet 0   metoprolol tartrate (LOPRESSOR) 25 MG tablet Take 1/2 tablet by mouth 2 times daily as needed (high heart rate). 180 tablet 1   montelukast (SINGULAIR) 10 MG tablet Take 1 tablet by mouth at bedtime. 90 tablet 1   omeprazole (PRILOSEC) 20 MG capsule Take 1 capsule by mouth daily. 90 capsule 1   Rimegepant Sulfate (NURTEC) 75 MG TBDP Dissolve one tablet by mouth once as needed for up to 1 dose. Not to exceed one tablet per 24-hr  period. Not to exceed 18 doses/30 days 30 tablet 2   topiramate (TOPAMAX) 50 MG tablet Take 1 and 1/2 tablets by mouth 2 times daily. 270 tablet 1   valACYclovir (VALTREX) 1000 MG tablet Take 2 tablets by mouth every 12 hours for 1 day at onset of symptoms of a cold sore. Take as needed. 30 tablet 1   No current facility-administered medications for this visit.    Allergies as of 01/18/2023 - Review Complete 09/10/2022  Allergen Reaction Noted   Bee pollen  04/13/2022   Molds & smuts  04/13/2022   Tramadol  02/17/2014    Vitals: There were no vitals taken for this visit. Last Weight:  Wt Readings from Last 1 Encounters:  09/10/22 250 lb (113.4 kg)   Last Height:   Ht Readings from Last 1 Encounters:  09/10/22 '5\' 3"'$  (1.6 m)     Physical exam: Exam: Gen: NAD, conversant, well nourised, obese, well groomed                     CV: RRR, no MRG. No Carotid Bruits. No peripheral edema, warm, nontender Eyes: Conjunctivae clear without  exudates or hemorrhage  Neuro: Detailed Neurologic Exam  Speech:    Speech is normal; fluent and spontaneous with normal comprehension.  Cognition:    The patient is oriented to person, place, and time;     recent and remote memory intact;     language fluent;     normal attention, concentration,     fund of knowledge Cranial Nerves:    The pupils are equal, round, and reactive to light. The fundi are normal and spontaneous venous pulsations are present. Visual fields are full to finger confrontation. Extraocular movements are intact. Trigeminal sensation is intact and the muscles of mastication are normal. The face is symmetric. The palate elevates in the midline. Hearing intact. Voice is normal. Shoulder shrug is normal. The tongue has normal motion without fasciculations.   Coordination:    Normal    Gait:     normal.   Motor Observation:    No asymmetry, no atrophy, and no involuntary movements noted. Tone:    Normal muscle tone.    Posture:    Posture is normal. normal erect    Strength:    Strength is V/V in the upper and lower limbs.      Sensation: intact to LT     Reflex Exam:  DTR's:    Deep tendon reflexes in the upper and lower extremities are normal bilaterally.   Toes:    The toes are downgoing bilaterally.   Clonus:    Clonus is absent.    Assessment/Plan:  patient with chronic migraines  Discussed MRi brain, decided to hold off, CT showed some possible sinusitis ethmoid may consider in the future Start ajovy  Discussed: To prevent or relieve headaches, try the following: Cool Compress. Lie down and place a cool compress on your head.  Avoid headache triggers. If certain foods or odors seem to have triggered your migraines in the past, avoid them. A headache diary might help you identify triggers.  Include physical activity in your daily routine. Try a daily walk or other moderate aerobic exercise.  Manage stress. Find healthy ways to cope with the  stressors, such as delegating tasks on your to-do list.  Practice relaxation techniques. Try deep breathing, yoga, massage and visualization.  Eat regularly. Eating regularly scheduled meals and maintaining a healthy diet might help prevent  headaches. Also, drink plenty of fluids.  Follow a regular sleep schedule. Sleep deprivation might contribute to headaches Consider biofeedback. With this mind-body technique, you learn to control certain bodily functions -- such as muscle tension, heart rate and blood pressure -- to prevent headaches or reduce headache pain.    Proceed to emergency room if you experience new or worsening symptoms or symptoms do not resolve, if you have new neurologic symptoms or if headache is severe, or for any concerning symptom.     No orders of the defined types were placed in this encounter.  No orders of the defined types were placed in this encounter.   Cc: Ivy Lynn, NP,  Ivy Lynn, NP (Inactive)  Sarina Ill, MD  [facility] [address] [place], [place] [address]  Phone [phone] Fax [phone]

## 2023-01-22 NOTE — Telephone Encounter (Signed)
Can pt get a refill? Pt scheduled apt 03/12/2023

## 2023-02-19 ENCOUNTER — Other Ambulatory Visit: Payer: Self-pay | Admitting: Family Medicine

## 2023-02-19 ENCOUNTER — Other Ambulatory Visit (HOSPITAL_COMMUNITY): Payer: Self-pay

## 2023-02-19 DIAGNOSIS — E038 Other specified hypothyroidism: Secondary | ICD-10-CM

## 2023-02-19 DIAGNOSIS — F411 Generalized anxiety disorder: Secondary | ICD-10-CM

## 2023-02-19 MED ORDER — LEVOTHYROXINE SODIUM 50 MCG PO TABS
50.0000 ug | ORAL_TABLET | Freq: Every day | ORAL | 0 refills | Status: DC
  Filled 2023-02-19 – 2023-05-01 (×3): qty 90, 90d supply, fill #0

## 2023-02-19 MED ORDER — FLUOXETINE HCL 20 MG PO CAPS
20.0000 mg | ORAL_CAPSULE | Freq: Every day | ORAL | 0 refills | Status: DC
  Filled 2023-02-19 – 2023-05-01 (×3): qty 90, 90d supply, fill #0

## 2023-03-01 ENCOUNTER — Other Ambulatory Visit (HOSPITAL_COMMUNITY): Payer: Self-pay

## 2023-03-12 ENCOUNTER — Encounter: Payer: Self-pay | Admitting: Family Medicine

## 2023-03-12 ENCOUNTER — Ambulatory Visit: Payer: Commercial Managed Care - PPO | Admitting: Family Medicine

## 2023-03-12 ENCOUNTER — Other Ambulatory Visit (HOSPITAL_BASED_OUTPATIENT_CLINIC_OR_DEPARTMENT_OTHER): Payer: Self-pay

## 2023-03-12 ENCOUNTER — Other Ambulatory Visit: Payer: Self-pay

## 2023-03-12 VITALS — BP 109/69 | HR 80 | Temp 98.2°F | Ht 63.0 in | Wt 246.6 lb

## 2023-03-12 DIAGNOSIS — E038 Other specified hypothyroidism: Secondary | ICD-10-CM | POA: Diagnosis not present

## 2023-03-12 DIAGNOSIS — Z1211 Encounter for screening for malignant neoplasm of colon: Secondary | ICD-10-CM | POA: Diagnosis not present

## 2023-03-12 DIAGNOSIS — J4520 Mild intermittent asthma, uncomplicated: Secondary | ICD-10-CM | POA: Diagnosis not present

## 2023-03-12 DIAGNOSIS — G43109 Migraine with aura, not intractable, without status migrainosus: Secondary | ICD-10-CM | POA: Diagnosis not present

## 2023-03-12 DIAGNOSIS — E782 Mixed hyperlipidemia: Secondary | ICD-10-CM

## 2023-03-12 DIAGNOSIS — K219 Gastro-esophageal reflux disease without esophagitis: Secondary | ICD-10-CM

## 2023-03-12 DIAGNOSIS — R000 Tachycardia, unspecified: Secondary | ICD-10-CM | POA: Diagnosis not present

## 2023-03-12 DIAGNOSIS — F411 Generalized anxiety disorder: Secondary | ICD-10-CM

## 2023-03-12 MED ORDER — METOPROLOL TARTRATE 25 MG PO TABS
12.5000 mg | ORAL_TABLET | Freq: Two times a day (BID) | ORAL | 1 refills | Status: AC | PRN
Start: 2023-03-12 — End: ?
  Filled 2023-03-12: qty 90, 90d supply, fill #0
  Filled 2024-02-21: qty 90, 90d supply, fill #1

## 2023-03-12 MED ORDER — OMEPRAZOLE 20 MG PO CPDR
20.0000 mg | DELAYED_RELEASE_CAPSULE | Freq: Every day | ORAL | 1 refills | Status: DC
Start: 2023-03-12 — End: 2023-10-20
  Filled 2023-03-12: qty 90, 90d supply, fill #0

## 2023-03-12 MED ORDER — IBUPROFEN 800 MG PO TABS
800.0000 mg | ORAL_TABLET | Freq: Three times a day (TID) | ORAL | 0 refills | Status: DC
Start: 2023-03-12 — End: 2023-10-20
  Filled 2023-03-12: qty 270, 90d supply, fill #0

## 2023-03-12 MED ORDER — TOPIRAMATE 50 MG PO TABS
75.0000 mg | ORAL_TABLET | Freq: Two times a day (BID) | ORAL | 1 refills | Status: DC
Start: 2023-03-12 — End: 2023-10-20
  Filled 2023-03-12: qty 270, 90d supply, fill #0

## 2023-03-12 NOTE — Patient Instructions (Signed)
Move with Summer [name]

## 2023-03-12 NOTE — Progress Notes (Signed)
New Patient Office Visit  Subjective   Patient ID: Summer [name], female    DOB: Jun 03, 1976  Age: 46 y.o. MRN: [id number]  CC:  Chief Complaint  Patient presents with  . Establish Care    Previous [facility] patient   . Medical Management of Chronic Issues   HPI Summer [name] presents to follow up on chronic conditions   Migraine with aura and without status migrainosus, not intractable Reports that she has several a month. She follows with neurology and is taking injectable. Increased topiramate. Reports things are improving. Needs refill of ibuprofen, has been on it for years.   Mixed hyperlipidemia Not currently on statin. Has not tried red yeast rice. Is trying lifestyle modification.   Morbid obesity ([facility]) Reports that she has tried to lose weight in the past. Cut out sodas. Decreased portion sides. Eliminated fried foods and sweets.   Tachycardia States that it does not happen every day. She can tell when it is increasing. She has not seen a Development worker, international aid. 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   Gastroesophageal reflux disease without esophagitis Denies red flags.  States that she has symptoms only when she eats late or when she eats certain foods.   GAD (generalized anxiety disorder) Some days are good and others bad.  Identifies work and situational triggers. States that she feels sometimes she needs something else to knock the edge off. She tries to avoid the triggers.   Mild intermittent asthma without complication States that she uses inhaler as needed. Increased used with seasonal allergies but otherwise controlled.   Subclinical hypothyroidism Endorses weight gain. Struggles to lose it. Anxiety. Reports that she stays hot.    Outpatient Encounter Medications as of 03/12/2023  Medication Sig  . albuterol (VENTOLIN HFA) 108 (90 Base) MCG/ACT inhaler Inhale 2 puffs into the lungs every 6 (six) hours as needed.  Marland Kitchen FLUoxetine  (PROZAC) 20 MG capsule Take 1 capsule by mouth daily.  . fluticasone (FLONASE) 50 MCG/ACT nasal spray Place 2 sprays into both nostrils daily.  . Fluticasone Furoate (ARNUITY ELLIPTA) 100 MCG/ACT AEPB Inhale 1 puff into the lungs daily at 6 (six) AM.  . Fremanezumab-vfrm (AJOVY) 225 MG/1.5ML SOAJ Inject 225 mg into the skin every 30 (thirty) days.  Marland Kitchen ibuprofen (ADVIL) 800 MG tablet TAKE 1 TABLET BY MOUTH 3 TIMES DAILY  . levocetirizine (XYZAL) 5 MG tablet Take 1 tablet (5 mg total) by mouth every evening.  Marland Kitchen levothyroxine (SYNTHROID) 50 MCG tablet Take 1 tablet by mouth daily.  . methocarbamol (ROBAXIN) 500 MG tablet Take 1 tablet (500 mg total) by mouth 4 (four) times daily. (Patient taking differently: Take 500 mg by mouth as needed.)  . metoprolol tartrate (LOPRESSOR) 25 MG tablet Take 1/2 tablet by mouth 2 times daily as needed (high heart rate).  . montelukast (SINGULAIR) 10 MG tablet Take 1 tablet by mouth at bedtime.  Marland Kitchen omeprazole (PRILOSEC) 20 MG capsule Take 1 capsule by mouth daily.  . Rimegepant Sulfate (NURTEC) 75 MG TBDP Dissolve one tablet by mouth once as needed for up to 1 dose. Not to exceed one tablet per 24-hr period. Not to exceed 18 doses/30 days  . topiramate (TOPAMAX) 50 MG tablet Take 1 and 1/2 tablets by mouth 2 times daily.  . valACYclovir (VALTREX) 1000 MG tablet Take 2 tablets by mouth every 12 hours for 1 day at onset of symptoms of a cold sore. Take as  needed.  . [DISCONTINUED] fluconazole (DIFLUCAN) 150 MG tablet Take 1 tablet (150 mg total) by mouth once a week.   No facility-administered encounter medications on file as of 03/12/2023.    Past Medical History:  Diagnosis Date  . Angio-edema   . Anxiety   . Asthma   . GERD (gastroesophageal reflux disease)   . Mixed hyperlipidemia 03/27/2021    Past Surgical History:  Procedure Laterality Date  . CHOLECYSTECTOMY N/A 02/21/2014   Procedure: LAPAROSCOPIC CHOLECYSTECTOMY;  Surgeon: Dalia Heading, MD;   Location: [facility];  Service: General;  Laterality: N/A;  . GALLBLADDER SURGERY    . TUBAL LIGATION    . TYMPANOSTOMY TUBE PLACEMENT Bilateral     Family History  Problem Relation Age of Onset  . Hypertension Mother   . Hypothyroidism Mother   . Heart disease Mother   . Colon cancer Mother 49  . Ovarian cancer Mother   . Heart disease Father   . Asthma Daughter   . Asthma Son   . Breast cancer Cousin   . Migraines Neg Hx   . Headache Neg Hx     Social History   Socioeconomic History  . Marital status: Married    Spouse name: Not on file  . Number of children: Not on file  . Years of education: Not on file  . Highest education level: Not on file  Occupational History  . Not on file  Tobacco Use  . Smoking status: Former    Packs/day: 0.50    Years: 3.00    Additional pack years: 0.00    Total pack years: 1.50    Types: Cigarettes    Quit date: 02/20/1998    Years since quitting: 25.0  . Smokeless tobacco: Never  Vaping Use  . Vaping Use: Never used  Substance and Sexual Activity  . Alcohol use: No  . Drug use: No  . Sexual activity: Yes    Birth control/protection: Surgical  Other Topics Concern  . Not on file  Social History Narrative  . Not on file   Social Determinants of Health   Financial Resource Strain: Not on file  Food Insecurity: Not on file  Transportation Needs: Not on file  Physical Activity: Not on file  Stress: Not on file  Social Connections: Not on file  Intimate Partner Violence: Not on file    ROS    Objective   BP 109/69   Pulse 80   Temp 98.2 F (36.8 C) (Temporal)   Ht 5\' 3"  (1.6 m)   Wt 246 lb 9.6 oz (111.9 kg)   LMP 02/10/2023   SpO2 97%   BMI 43.68 kg/m   Physical Exam     03/12/2023    1:19 PM 05/22/2022    3:45 PM 02/11/2022    9:03 AM  Depression screen PHQ 2/9  Decreased Interest 0 1 0  Down, Depressed, Hopeless 0 0 1  PHQ - 2 Score 0 1 1  Altered sleeping 0 0 0  Tired, decreased energy 1 1 1   Change in  appetite 0 0 0  Feeling bad or failure about yourself  0 0 0  Trouble concentrating 0 0 0  Moving slowly or fidgety/restless 0 0 0  Suicidal thoughts 0 0 0  PHQ-9 Score 1 2 2   Difficult doing work/chores Not difficult at all Not difficult at all Not difficult at all      03/12/2023    1:19 PM 05/22/2022    3:46  PM 02/11/2022    9:03 AM 07/03/2021    1:32 PM  GAD 7 : Generalized Anxiety Score  Nervous, Anxious, on Edge 1 1 1 1   Control/stop worrying 0 0 1 0  Worry too much - different things 0 0 1 0  Trouble relaxing 0 1 0 0  Restless 0 0 0 0  Easily annoyed or irritable 1 1 1  0  Afraid - awful might happen 0 0 0 0  Total GAD 7 Score 2 3 4 1   Anxiety Difficulty Not difficult at all Somewhat difficult Not difficult at all Somewhat difficult    Assessment & Plan:   The above assessment and management plan was discussed with the patient. The patient verbalized understanding of and has agreed to the management plan using shared-decision making. Patient is aware to call the clinic if they develop any new symptoms or if symptoms fail to improve or worsen. Patient is aware when to return to the clinic for a follow-up visit. Patient educated on when it is appropriate to go to the emergency department.   No follow-ups on file.   Neale Burly, DNP-FNP [facility] [address] [place], [place] [address] [phone]

## 2023-03-15 ENCOUNTER — Encounter: Payer: Self-pay | Admitting: *Deleted

## 2023-03-15 ENCOUNTER — Encounter: Payer: Self-pay | Admitting: Family Medicine

## 2023-03-27 ENCOUNTER — Other Ambulatory Visit (HOSPITAL_COMMUNITY): Payer: Self-pay

## 2023-03-27 ENCOUNTER — Other Ambulatory Visit: Payer: Self-pay | Admitting: Allergy & Immunology

## 2023-04-07 ENCOUNTER — Other Ambulatory Visit: Payer: Self-pay

## 2023-04-09 ENCOUNTER — Other Ambulatory Visit (HOSPITAL_COMMUNITY): Payer: Self-pay

## 2023-04-20 ENCOUNTER — Ambulatory Visit: Payer: Commercial Managed Care - PPO | Admitting: Nutrition

## 2023-05-01 ENCOUNTER — Other Ambulatory Visit (HOSPITAL_COMMUNITY): Payer: Self-pay

## 2023-05-01 ENCOUNTER — Other Ambulatory Visit: Payer: Self-pay | Admitting: Allergy & Immunology

## 2023-05-03 ENCOUNTER — Other Ambulatory Visit (HOSPITAL_COMMUNITY): Payer: Self-pay

## 2023-06-22 ENCOUNTER — Encounter: Payer: Self-pay | Admitting: Nutrition

## 2023-06-22 ENCOUNTER — Encounter: Payer: Commercial Managed Care - PPO | Attending: Family Medicine | Admitting: Nutrition

## 2023-06-22 DIAGNOSIS — E782 Mixed hyperlipidemia: Secondary | ICD-10-CM

## 2023-06-22 NOTE — Patient Instructions (Addendum)
Goals  Cut out mac/cheese  Only get Starbucks to 1 time per week Work on meal planning and meal prepping Take lunch to work. Start walking 15 minutes a day 5 days a week. Focus on more whole plant based foods

## 2023-06-22 NOTE — Progress Notes (Signed)
Medical Nutrition Therapy  Employee [phone] Appointment Start time:  1100  Appointment End time:  1215  Primary concerns today: Obesity  Referral diagnosis: E66.01 Preferred learning style: No Preference  Learning readiness: Contemplating    NUTRITION ASSESSMENT  47 yr old wfemale referred for obesity. 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  Works 7 a to 7 p 3 x week and [employer]. Wants to lose weight. PMH: Asthma, Hypothyroidism and Hyperlipidemia. PCP Alwyn Pea. Currently taking  Ajovy once a month for the last 5-6 months. She has not lost any significant weight in that time frame. She has lost about 10 lbs and then gained it back in those 5-6 months   Diet is highly processed with very little fruit, vegeables and whole grains. Food choices are high in sugar, fat and salt contributing to her obesity and hyperlipidemia.  No recent lab work done to evaluate for pre diabetes or diabetes. She is high risk for a fatty liver, CVD and DM due to current diet choices.  She is willing to work with Lifestyle Medicine to improve her health by changing her behaviors of what she eats and exercise.    Anthropometrics  Wt Readings from Last 3 Encounters:  03/12/23 246 lb 9.6 oz (111.9 kg)  09/10/22 250 lb (113.4 kg)  07/15/22 255 lb (115.7 kg)   Ht Readings from Last 3 Encounters:  03/12/23 5\' 3"  (1.6 m)  09/10/22 5\' 3"  (1.6 m)  07/15/22 5\' 2"  (1.575 m)   There is no height or weight on file to calculate BMI. @BMIFA @ Facility age limit for growth %iles is 20 years. Facility age limit for growth %iles is 20 years.    Clinical Medical Hx: Asthma, Hypothyroidism, Obesity, Hyperlipidemia Medications: see chart Labs:     Latest Ref Rng & Units 05/22/2022    4:06 PM 03/26/2021   12:11 PM 05/21/2020    4:21 PM  CMP  Glucose 70 - 99 mg/dL 96  94  88   BUN 6 - 24 mg/dL 8  10  11    Creatinine 0.57 - 1.00 mg/dL 4.69  6.29  5.28   Sodium 134 - 144 mmol/L 139  137  137   Potassium  3.5 - 5.2 mmol/L 3.8  4.0  3.4   Chloride 96 - 106 mmol/L 103  102  104   CO2 20 - 29 mmol/L 22  21  24    Calcium 8.7 - 10.2 mg/dL 8.9  9.3  8.6   Total Protein 6.0 - 8.5 g/dL 6.8  7.1  7.7   Total Bilirubin 0.0 - 1.2 mg/dL <4.1  0.3  0.3   Alkaline Phos 44 - 121 IU/L 91  106  81   AST 0 - 40 IU/L 15  17  19    ALT 0 - 32 IU/L 11  18  17    Lipid Panel     Component Value Date/Time   CHOL 190 05/22/2022 1606   TRIG 359 (H) 05/22/2022 1606   HDL 35 (L) 05/22/2022 1606   CHOLHDL 5.4 (H) 05/22/2022 1606   LDLCALC 95 05/22/2022 1606   LABVLDL 60 (H) 05/22/2022 1606    Notable Signs/Symptoms: None  Lifestyle & Dietary Hx Lives with her spouse. She shops and her husband cooks. Works 7a to 7 pm 3 days per week at [employer] and [employer].   Estimated daily fluid intake: 40 oz- from starbuck coffees, sodas or diet sodas and tea. Not much water intake Supplements:  Sleep:  poor Stress / self-care: some stress Current average weekly physical activity: ADL at work   24-Hr Dietary Recall First Meal: sausage biscuit and gravy, starbucks Snack: chips, snack food Second Meal: hot dog, chips, chili/slaw, sweet tea Snack: Misc junk food, sweets at times Third Meal: COunty style steak, gravy, mac/cheese, fries, tea or diet soda Snack: misc Beverages: sodas, tea, sugared coffee  Estimated Energy Needs Calories: 1200 Carbohydrate: 135g Protein: 90g Fat: 33g   NUTRITION DIAGNOSIS  NB-1.1 Food and nutrition-related knowledge deficit As related to High calorie diet from processed foods.  As evidenced by BMI 43 and diet recall.   NUTRITION INTERVENTION  Nutrition education (E-1) on the following topics:  Lifestyle Medicine  - Whole Food, Plant Predominant Nutrition is highly recommended: Eat Plenty of vegetables, Mushrooms, fruits, Legumes, Whole Grains, Nuts, seeds in lieu of processed meats, processed snacks/pastries red meat, poultry, eggs.    -It is better to avoid simple carbohydrates  including: Cakes, Sweet Desserts, Ice Cream, Soda (diet and regular), Sweet Tea, Candies, Chips, Cookies, Store Bought Juices, Alcohol in Excess of  1-2 drinks a day, Lemonade,  Artificial Sweeteners, Doughnuts, Coffee Creamers, "Sugar-free" Products, etc, etc.  This is not a complete list.....  Exercise: If you are able: 30 -60 minutes a day ,4 days a week, or 150 minutes a week.  The longer the better.  Combine stretch, strength, and aerobic activities.  If you were told in the past that you have high risk for cardiovascular diseases, you may seek evaluation by your heart doctor prior to initiating moderate to intense exercise programs.   Handouts Provided Include  Lifestyle Medicine Booklet  Learning Style & Readiness for Change Teaching method utilized: Visual & Auditory  Demonstrated degree of understanding via: Teach Back  Barriers to learning/adherence to lifestyle change: None  Goals Established by Pt Goals  Cut out mac/cheese  Only get Starbucks to 1 time per week Work on meal planning and meal prepping Take lunch to work. Start walking 15 minutes a day 5 days a week. Focus on more whole plant based foods    MONITORING & EVALUATION Dietary intake, weekly physical activity, and weight in 1 month.  Next Steps  Patient is to work on meal planning and meal prepping.

## 2023-07-26 ENCOUNTER — Encounter: Payer: Self-pay | Admitting: Nutrition

## 2023-07-26 ENCOUNTER — Encounter: Payer: Commercial Managed Care - PPO | Attending: Family Medicine | Admitting: Nutrition

## 2023-07-26 DIAGNOSIS — E782 Mixed hyperlipidemia: Secondary | ICD-10-CM | POA: Insufficient documentation

## 2023-07-26 NOTE — Progress Notes (Signed)
Medical Nutrition Therapy  Employee [phone] Appointment Start time:  1430  Appointment End time:  1500  Primary concerns today: Obesity  Referral diagnosis: E66.01 Preferred learning style: No Preference  Learning readiness: Contemplating    NUTRITION ASSESSMENT Employee [phone] Second visit. Lost 7 lbs. Has been eating a lot better and taking meals to work. Has been eating [employer].at work. Changes made: Cut out mac/cheese - done well; cut back Only get [employer] to 1 time per week-done great Work on meal planning and meal prepping-doing well. Take lunch to work.-improved Start walking 15 minutes a day 5 days a week.- work in progress. Focus on more whole plant based foods -done   Feels a lot better. Sleeping better. Clothes fit better. More energy. Eating healthier foods choices.  Anthropometrics  Wt Readings from Last 3 Encounters:  06/22/23 245 lb (111.1 kg)  03/12/23 246 lb 9.6 oz (111.9 kg)  09/10/22 250 lb (113.4 kg)   Ht Readings from Last 3 Encounters:  06/22/23 5\' 3"  (1.6 m)  03/12/23 5\' 3"  (1.6 m)  09/10/22 5\' 3"  (1.6 m)   There is no height or weight on file to calculate BMI. @BMIFA @ Facility age limit for growth %iles is 20 years. Facility age limit for growth %iles is 20 years.    Clinical Medical Hx: Asthma, Hypothyroidism, Obesity, Hyperlipidemia Medications: see chart Labs:     Latest Ref Rng & Units 05/22/2022    4:06 PM 03/26/2021   12:11 PM 05/21/2020    4:21 PM  CMP  Glucose 70 - 99 mg/dL 96  94  88   BUN 6 - 24 mg/dL 8  10  11    Creatinine 0.57 - 1.00 mg/dL 8.65  7.84  6.96   Sodium 134 - 144 mmol/L 139  137  137   Potassium 3.5 - 5.2 mmol/L 3.8  4.0  3.4   Chloride 96 - 106 mmol/L 103  102  104   CO2 20 - 29 mmol/L 22  21  24    Calcium 8.7 - 10.2 mg/dL 8.9  9.3  8.6   Total Protein 6.0 - 8.5 g/dL 6.8  7.1  7.7   Total Bilirubin 0.0 - 1.2 mg/dL <2.9  0.3  0.3   Alkaline Phos 44 - 121 IU/L 91  106  81   AST 0 - 40 IU/L 15   17  19    ALT 0 - 32 IU/L 11  18  17    Lipid Panel     Component Value Date/Time   CHOL 190 05/22/2022 1606   TRIG 359 (H) 05/22/2022 1606   HDL 35 (L) 05/22/2022 1606   CHOLHDL 5.4 (H) 05/22/2022 1606   LDLCALC 95 05/22/2022 1606   LABVLDL 60 (H) 05/22/2022 1606    Notable Signs/Symptoms: None  Lifestyle & Dietary Hx Lives with her spouse. She shops and her husband cooks. Works 7a to 7 pm 3 days per week at [employer] and [employer].   Estimated daily fluid intake: 40 oz- from [employer] coffees, sodas or diet sodas and tea. Not much water intake Supplements:  Sleep: poor Stress / self-care: some stress Current average weekly physical activity: ADL at work   24-Hr Dietary Recall First Meal:Protein shake and boiled egg, water Snack: yogurt Second Meal: sausage, eggs, 1 slice toast, water Third Meal: Healthy Choice power bowl meals. water Snack: misc Beverages: water  Estimated Energy Needs Calories: 1200 Carbohydrate: 135g Protein: 90g Fat: 33g   NUTRITION DIAGNOSIS  NB-1.1 Food  and nutrition-related knowledge deficit As related to High calorie diet from processed foods.  As evidenced by BMI 43 and diet recall.   NUTRITION INTERVENTION  Nutrition education (E-1) on the following topics:  Lifestyle Medicine  - Whole Food, Plant Predominant Nutrition is highly recommended: Eat Plenty of vegetables, Mushrooms, fruits, Legumes, Whole Grains, Nuts, seeds in lieu of processed meats, processed snacks/pastries red meat, poultry, eggs.    -It is better to avoid simple carbohydrates including: Cakes, Sweet Desserts, Ice Cream, Soda (diet and regular), Sweet Tea, Candies, Chips, Cookies, Store Bought Juices, Alcohol in Excess of  1-2 drinks a day, Lemonade,  Artificial Sweeteners, Doughnuts, Coffee Creamers, "Sugar-free" Products, etc, etc.  This is not a complete list.....  Exercise: If you are able: 30 -60 minutes a day ,4 days a week, or 150 minutes a week.  The longer the better.   Combine stretch, strength, and aerobic activities.  If you were told in the past that you have high risk for cardiovascular diseases, you may seek evaluation by your heart doctor prior to initiating moderate to intense exercise programs.   Handouts Provided Include  Lifestyle Medicine Booklet  Learning Style & Readiness for Change Teaching method utilized: Visual & Auditory  Demonstrated degree of understanding via: Teach Back  Barriers to learning/adherence to lifestyle change: None  Goals Established by Pt Goals  Increase fiber rich foods to 25 grams per day. Lose 2-3 lbs per month Walk 15-30 minutes 3-4 times per week. Ask MD to check A1C and Lipid profile again since going more whole plant based.   MONITORING & EVALUATION Dietary intake, weekly physical activity, and weight in 1 month.  Next Steps  Patient is to work on meal planning and meal prepping.

## 2023-07-26 NOTE — Patient Instructions (Addendum)
Goals  Increase fiber rich foods to 25 grams per day. Lose 2-3 lbs per month Walk 15-30 minutes 3-4 times per week. Ask MD to check A1C and Lipid profile again since going more whole plant based.

## 2023-09-06 ENCOUNTER — Other Ambulatory Visit (HOSPITAL_COMMUNITY): Payer: Self-pay

## 2023-09-19 ENCOUNTER — Telehealth: Payer: Commercial Managed Care - PPO | Admitting: Nurse Practitioner

## 2023-09-19 DIAGNOSIS — J019 Acute sinusitis, unspecified: Secondary | ICD-10-CM | POA: Diagnosis not present

## 2023-09-19 DIAGNOSIS — J302 Other seasonal allergic rhinitis: Secondary | ICD-10-CM

## 2023-09-19 DIAGNOSIS — B9789 Other viral agents as the cause of diseases classified elsewhere: Secondary | ICD-10-CM

## 2023-09-19 MED ORDER — FLUTICASONE PROPIONATE 50 MCG/ACT NA SUSP
2.0000 | Freq: Every day | NASAL | 0 refills | Status: DC
Start: 2023-09-19 — End: 2023-10-20

## 2023-09-19 NOTE — Progress Notes (Signed)
E-Visit for Sinus Problems  We are sorry that you are not feeling well.  Here is how we plan to help!  Based on what you have shared with me it looks like you have VIRAL sinusitis which we do not treat with antibiotics.  Sinusitis is inflammation and infection in the sinus cavities of the head.  Based on your presentation I believe you most likely have Acute Viral Sinusitis.This is an infection most likely caused by a virus. There is not specific treatment for viral sinusitis other than to help you with the symptoms until the infection runs its course.  You may use an oral decongestant such as Mucinex D or if you have glaucoma or high blood pressure use plain Mucinex. Saline nasal spray help and can safely be used as often as needed for congestion   Providers prescribe antibiotics to treat infections caused by bacteria. Antibiotics are very powerful in treating bacterial infections when they are used properly. To maintain their effectiveness, they should be used only when necessary. Overuse of antibiotics has resulted in the development of superbugs that are resistant to treatment!    After careful review of your answers, I would not recommend an antibiotic for your condition.  Antibiotics are not effective against viruses and therefore should not be used to treat them. Common examples of infections caused by viruses include colds and flu.  I do understand you have a history of re occurring sinus infections however based on our treatment plan you would not require antibiotics at this time. If you feel differently I recommend you be seen face to face at an urgent care for formal evaluation.   Some authorities believe that zinc sprays or the use of Echinacea may shorten the course of your symptoms.  Sinus infections are not as easily transmitted as other respiratory infection, however we still recommend that you avoid close contact with loved ones, especially the very young and elderly.  Remember to  wash your hands thoroughly throughout the day as this is the number one way to prevent the spread of infection!  Home Care: Only take medications as instructed by your medical team. Do not take these medications with alcohol. A steam or ultrasonic humidifier can help congestion.  You can place a towel over your head and breathe in the steam from hot water coming from a faucet. Avoid close contacts especially the very young and the elderly. Cover your mouth when you cough or sneeze. Always remember to wash your hands.  Get Help Right Away If: You develop worsening fever or sinus pain. You develop a severe head ache or visual changes. Your symptoms persist after you have completed your treatment plan.  Make sure you Understand these instructions. Will watch your condition. Will get help right away if you are not doing well or get worse.   Thank you for choosing an e-visit.  Your e-visit answers were reviewed by a board certified advanced clinical practitioner to complete your personal care plan. Depending upon the condition, your plan could have included both over the counter or prescription medications.  Please review your pharmacy choice. Make sure the pharmacy is open so you can pick up prescription now. If there is a problem, you may contact your provider through [employer] and have the prescription routed to another pharmacy.  Your safety is important to [place]. If you have drug allergies check your prescription carefully.   For the next 24 hours you can use MyChart to ask questions about today's  visit, request a non-urgent call back, or ask for a work or school excuse. You will get an email in the next two days asking about your experience. I hope that your e-visit has been valuable and will speed your recovery.

## 2023-09-19 NOTE — Progress Notes (Signed)
I have spent 5 minutes in review of e-visit questionnaire, review and updating patient chart, medical decision making and response to patient.  ° °Jerrell Mangel W Secilia Apps, NP ° °  °

## 2023-09-20 ENCOUNTER — Encounter: Payer: Self-pay | Admitting: *Deleted

## 2023-09-23 ENCOUNTER — Encounter: Payer: Self-pay | Admitting: Family Medicine

## 2023-09-23 ENCOUNTER — Ambulatory Visit: Payer: Commercial Managed Care - PPO | Admitting: Family Medicine

## 2023-09-23 VITALS — BP 102/67 | HR 86 | Temp 98.2°F | Ht 63.0 in | Wt 237.0 lb

## 2023-09-23 DIAGNOSIS — J019 Acute sinusitis, unspecified: Secondary | ICD-10-CM

## 2023-09-23 DIAGNOSIS — B9689 Other specified bacterial agents as the cause of diseases classified elsewhere: Secondary | ICD-10-CM | POA: Diagnosis not present

## 2023-09-23 MED ORDER — AMOXICILLIN-POT CLAVULANATE 875-125 MG PO TABS
1.0000 | ORAL_TABLET | Freq: Two times a day (BID) | ORAL | 0 refills | Status: AC
Start: 2023-09-23 — End: 2023-09-30

## 2023-09-23 NOTE — Progress Notes (Signed)
Subjective:  Patient ID: Summer [name], female    DOB: 1976/03/08, 47 y.o.   MRN: [id number]  Patient Care Team: Ellamae Sia Aleen Campi, FNP as PCP - General (Family Medicine)   Chief Complaint:  Sinus Problem (X1 week/Pressure/Congestion/Facial pain/cough/)  HPI: Summer [name] is a 47 y.o. female presenting on 09/23/2023 for Sinus Problem (X1 week/Pressure/Congestion/Facial pain/cough/) States that symptoms started Friday last week. States that she is prone to sinus infections. She is feeling worse. Completed telehealth visit Sunday and was told to treat as viral sinusitis.  States that she has taking mucinex, flonase, and using her inhaler more.  States that she feels short of breath in evenings, this started on Monday.  Reports cough, productive with green phlegm, scratchy throat, sinus pressure and pain. Has not checked for temperature. Denies ear and teeth pain. Reports that she has had pneumonia in the past. States that this feels somewhat similar.   Relevant past medical, surgical, family, and social history reviewed and updated as indicated.  Allergies and medications reviewed and updated. Data reviewed: Chart in Epic.   Past Medical History:  Diagnosis Date   Angio-edema    Anxiety    Asthma    GERD (gastroesophageal reflux disease)    Mixed hyperlipidemia 03/27/2021    Past Surgical History:  Procedure Laterality Date   CHOLECYSTECTOMY N/A 02/21/2014   Procedure: LAPAROSCOPIC CHOLECYSTECTOMY;  Surgeon: Dalia Heading, MD;  Location: [facility];  Service: General;  Laterality: N/A;   GALLBLADDER SURGERY     TUBAL LIGATION     TYMPANOSTOMY TUBE PLACEMENT Bilateral     Social History   Socioeconomic History   Marital status: Married    Spouse name: Not on file   Number of children: Not on file   Years of education: Not on file   Highest education level: Not on file  Occupational History   Not on file  Tobacco Use   Smoking status: Former    Current  packs/day: 0.00    Average packs/day: 0.5 packs/day for 3.0 years (1.5 ttl pk-yrs)    Types: Cigarettes    Start date: 02/21/1995    Quit date: 02/20/1998    Years since quitting: 25.6   Smokeless tobacco: Never  Vaping Use   Vaping status: Never Used  Substance and Sexual Activity   Alcohol use: No   Drug use: No   Sexual activity: Yes    Birth control/protection: Surgical  Other Topics Concern   Not on file  Social History Narrative   Not on file   Social Determinants of Health   Financial Resource Strain: Not on file  Food Insecurity: Not on file  Transportation Needs: Not on file  Physical Activity: Not on file  Stress: Not on file  Social Connections: Not on file  Intimate Partner Violence: Not on file    Outpatient Encounter Medications as of 09/23/2023  Medication Sig   albuterol (VENTOLIN HFA) 108 (90 Base) MCG/ACT inhaler Inhale 2 puffs into the lungs every 6 (six) hours as needed.   FLUoxetine (PROZAC) 20 MG capsule Take 1 capsule by mouth daily.   fluticasone (FLONASE) 50 MCG/ACT nasal spray Place 2 sprays into both nostrils daily.   Fluticasone Furoate (ARNUITY ELLIPTA) 100 MCG/ACT AEPB Inhale 1 puff into the lungs daily at 6 (six) AM.   Fremanezumab-vfrm (AJOVY) 225 MG/1.5ML SOAJ Inject 225 mg into the skin every 30 (thirty) days.   ibuprofen (ADVIL) 800 MG tablet Take 1 tablet (800 mg  total) by mouth 3 (three) times daily.   levocetirizine (XYZAL) 5 MG tablet Take 1 tablet (5 mg total) by mouth every evening.   levothyroxine (SYNTHROID) 50 MCG tablet Take 1 tablet by mouth daily.   metoprolol tartrate (LOPRESSOR) 25 MG tablet Take 1/2 tablet by mouth 2 times daily as needed (high heart rate).   montelukast (SINGULAIR) 10 MG tablet Take 1 tablet by mouth at bedtime.   omeprazole (PRILOSEC) 20 MG capsule Take 1 capsule by mouth daily.   Rimegepant Sulfate (NURTEC) 75 MG TBDP Dissolve one tablet by mouth once as needed for up to 1 dose. Not to exceed one tablet per  24-hr period. Not to exceed 18 doses/30 days   topiramate (TOPAMAX) 50 MG tablet Take 1 and 1/2 tablets by mouth 2 times daily.   valACYclovir (VALTREX) 1000 MG tablet Take 2 tablets by mouth every 12 hours for 1 day at onset of symptoms of a cold sore. Take as needed.   No facility-administered encounter medications on file as of 09/23/2023.    Allergies  Allergen Reactions   Bee Pollen    Molds & Smuts    Tramadol     Vomiting and nausea    Review of Systems As per HPI  Objective:  BP 102/67   Pulse 86   Temp 98.2 F (36.8 C)   Ht 5\' 3"  (1.6 m)   Wt 237 lb (107.5 kg)   LMP 09/20/2023 (Exact Date)   SpO2 96%   BMI 41.98 kg/m    Wt Readings from Last 3 Encounters:  07/26/23 238 lb (108 kg)  06/22/23 245 lb (111.1 kg)  03/12/23 246 lb 9.6 oz (111.9 kg)   Physical Exam Constitutional:      General: She is awake. She is not in acute distress.    Appearance: Normal appearance. She is well-developed and well-groomed. She is not ill-appearing, toxic-appearing or diaphoretic.  HENT:     Head:     Salivary Glands: Right salivary gland is not diffusely enlarged or tender. Left salivary gland is not diffusely enlarged or tender.     Right Ear: No laceration, drainage, swelling or tenderness. A middle ear effusion is present. There is no impacted cerumen. No foreign body. No mastoid tenderness. No PE tube. No hemotympanum. Tympanic membrane is scarred. Tympanic membrane is not injected, perforated, erythematous, retracted or bulging.     Left Ear: No laceration, drainage, swelling or tenderness. A middle ear effusion is present. There is no impacted cerumen. No foreign body. No mastoid tenderness. No PE tube. No hemotympanum. Tympanic membrane is not injected, scarred, perforated, erythematous, retracted or bulging.     Nose: Nasal tenderness, mucosal edema, congestion and rhinorrhea present. Rhinorrhea is clear.     Right Turbinates: Swollen.     Left Turbinates: Enlarged and  swollen.     Right Sinus: Maxillary sinus tenderness and frontal sinus tenderness present.     Left Sinus: Maxillary sinus tenderness and frontal sinus tenderness present.     Mouth/Throat:     Lips: Pink. No lesions.     Mouth: Mucous membranes are moist. No injury, lacerations, oral lesions or angioedema.     Tongue: No lesions. Tongue does not deviate from midline.     Palate: No mass and lesions.     Pharynx: Posterior oropharyngeal erythema present.     Tonsils: No tonsillar exudate or tonsillar abscesses.  Cardiovascular:     Rate and Rhythm: Normal rate and regular rhythm.  Pulses: Normal pulses.          Radial pulses are 2+ on the right side and 2+ on the left side.       Posterior tibial pulses are 2+ on the right side and 2+ on the left side.     Heart sounds: Normal heart sounds. No murmur heard.    No gallop.  Pulmonary:     Effort: Pulmonary effort is normal. No respiratory distress.     Breath sounds: Normal breath sounds. No stridor. No wheezing, rhonchi or rales.  Musculoskeletal:     Cervical back: Full passive range of motion without pain and neck supple.     Right lower leg: No edema.     Left lower leg: No edema.  Skin:    General: Skin is warm.     Capillary Refill: Capillary refill takes less than 2 seconds.  Neurological:     General: No focal deficit present.     Mental Status: She is alert, oriented to person, place, and time and easily aroused. Mental status is at baseline.     GCS: GCS eye subscore is 4. GCS verbal subscore is 5. GCS motor subscore is 6.     Motor: No weakness.  Psychiatric:        Attention and Perception: Attention and perception normal.        Mood and Affect: Mood and affect normal.        Speech: Speech normal.        Behavior: Behavior normal. Behavior is cooperative.        Thought Content: Thought content normal. Thought content does not include homicidal or suicidal ideation. Thought content does not include homicidal or  suicidal plan.        Cognition and Memory: Cognition and memory normal.        Judgment: Judgment normal.     Results for orders placed or performed in visit on 05/22/22  CBC with Differential/Platelet  Result Value Ref Range   WBC 7.8 3.4 - 10.8 x10E3/uL   RBC 4.43 3.77 - 5.28 x10E6/uL   Hemoglobin 13.6 11.1 - 15.9 g/dL   Hematocrit 40.9 81.1 - 46.6 %   MCV 91 79 - 97 fL   MCH 30.7 26.6 - 33.0 pg   MCHC 33.7 31.5 - 35.7 g/dL   RDW 91.4 78.2 - 95.6 %   Platelets 257 150 - 450 x10E3/uL   Neutrophils 56 Not Estab. %   Lymphs 35 Not Estab. %   Monocytes 6 Not Estab. %   Eos 3 Not Estab. %   Basos 0 Not Estab. %   Neutrophils Absolute 4.3 1.4 - 7.0 x10E3/uL   Lymphocytes Absolute 2.8 0.7 - 3.1 x10E3/uL   Monocytes Absolute 0.5 0.1 - 0.9 x10E3/uL   EOS (ABSOLUTE) 0.3 0.0 - 0.4 x10E3/uL   Basophils Absolute 0.0 0.0 - 0.2 x10E3/uL   Immature Granulocytes 0 Not Estab. %   Immature Grans (Abs) 0.0 0.0 - 0.1 x10E3/uL  CMP14+EGFR  Result Value Ref Range   Glucose 96 70 - 99 mg/dL   BUN 8 6 - 24 mg/dL   Creatinine, Ser 2.13 0.57 - 1.00 mg/dL   eGFR 086 >57 QI/ONG/2.95   BUN/Creatinine Ratio 13 9 - 23   Sodium 139 134 - 144 mmol/L   Potassium 3.8 3.5 - 5.2 mmol/L   Chloride 103 96 - 106 mmol/L   CO2 22 20 - 29 mmol/L   Calcium 8.9 8.7 - 10.2 mg/dL  Total Protein 6.8 6.0 - 8.5 g/dL   Albumin 3.6 (L) 3.9 - 4.9 g/dL   Globulin, Total 3.2 1.5 - 4.5 g/dL   Albumin/Globulin Ratio 1.1 (L) 1.2 - 2.2   Bilirubin Total <0.2 0.0 - 1.2 mg/dL   Alkaline Phosphatase 91 44 - 121 IU/L   AST 15 0 - 40 IU/L   ALT 11 0 - 32 IU/L  Lipid panel  Result Value Ref Range   Cholesterol, Total 190 100 - 199 mg/dL   Triglycerides 409 (H) 0 - 149 mg/dL   HDL 35 (L) >81 mg/dL   VLDL Cholesterol Cal 60 (H) 5 - 40 mg/dL   LDL Chol Calc (NIH) 95 0 - 99 mg/dL   Chol/HDL Ratio 5.4 (H) 0.0 - 4.4 ratio  TSH  Result Value Ref Range   TSH 3.850 0.450 - 4.500 uIU/mL  T4, free  Result Value Ref Range    Free T4 0.99 0.82 - 1.77 ng/dL       1/91/4782   95:62 AM 03/12/2023    1:19 PM 05/22/2022    3:45 PM 02/11/2022    9:03 AM 07/03/2021    1:31 PM  Depression screen PHQ 2/9  Decreased Interest 0 0 1 0 1  Down, Depressed, Hopeless 0 0 0 1 0  PHQ - 2 Score 0 0 1 1 1   Altered sleeping  0 0 0 1  Tired, decreased energy  1 1 1 1   Change in appetite  0 0 0 0  Feeling bad or failure about yourself   0 0 0 0  Trouble concentrating  0 0 0 0  Moving slowly or fidgety/restless  0 0 0 0  Suicidal thoughts  0 0 0 0  PHQ-9 Score  1 2 2 3   Difficult doing work/chores  Not difficult at all Not difficult at all Not difficult at all Somewhat difficult       03/12/2023    1:19 PM 05/22/2022    3:46 PM 02/11/2022    9:03 AM 07/03/2021    1:32 PM  GAD 7 : Generalized Anxiety Score  Nervous, Anxious, on Edge 1 1 1 1   Control/stop worrying 0 0 1 0  Worry too much - different things 0 0 1 0  Trouble relaxing 0 1 0 0  Restless 0 0 0 0  Easily annoyed or irritable 1 1 1  0  Afraid - awful might happen 0 0 0 0  Total GAD 7 Score 2 3 4 1   Anxiety Difficulty Not difficult at all Somewhat difficult Not difficult at all Somewhat difficult   Pertinent labs & imaging results that were available during my care of the patient were reviewed by me and considered in my medical decision making.  Assessment & Plan:  Shanalee was seen today for sinus problem.  Diagnoses and all orders for this visit:  Acute bacterial rhinosinusitis Will start medication as below. Patient to continue at home supportive therapies. Follow up if symptoms worsen and will consider CXR.  -     amoxicillin-clavulanate (AUGMENTIN) 875-125 MG tablet; Take 1 tablet by mouth 2 (two) times daily for 7 days.  Continue all other maintenance medications.  Follow up plan: Return if symptoms worsen or fail to improve.  Continue healthy lifestyle choices, including diet (rich in fruits, vegetables, and lean proteins, and low in salt and simple  carbohydrates) and exercise (at least 30 minutes of moderate physical activity daily).  Written and verbal instructions provided   The  above assessment and management plan was discussed with the patient. The patient verbalized understanding of and has agreed to the management plan. Patient is aware to call the clinic if they develop any new symptoms or if symptoms persist or worsen. Patient is aware when to return to the clinic for a follow-up visit. Patient educated on when it is appropriate to go to the emergency department.   Neale Burly, DNP-FNP [facility] [address] [place], [place] [address] [phone]

## 2023-09-28 ENCOUNTER — Encounter: Payer: Commercial Managed Care - PPO | Admitting: Family Medicine

## 2023-10-20 ENCOUNTER — Encounter: Payer: Self-pay | Admitting: Family Medicine

## 2023-10-20 ENCOUNTER — Ambulatory Visit (INDEPENDENT_AMBULATORY_CARE_PROVIDER_SITE_OTHER): Payer: Commercial Managed Care - PPO | Admitting: Family Medicine

## 2023-10-20 VITALS — BP 98/63 | HR 78 | Temp 98.0°F | Ht 63.0 in | Wt 236.0 lb

## 2023-10-20 DIAGNOSIS — K219 Gastro-esophageal reflux disease without esophagitis: Secondary | ICD-10-CM | POA: Diagnosis not present

## 2023-10-20 DIAGNOSIS — J302 Other seasonal allergic rhinitis: Secondary | ICD-10-CM | POA: Diagnosis not present

## 2023-10-20 DIAGNOSIS — Z1231 Encounter for screening mammogram for malignant neoplasm of breast: Secondary | ICD-10-CM | POA: Diagnosis not present

## 2023-10-20 DIAGNOSIS — Z0000 Encounter for general adult medical examination without abnormal findings: Secondary | ICD-10-CM

## 2023-10-20 DIAGNOSIS — G43109 Migraine with aura, not intractable, without status migrainosus: Secondary | ICD-10-CM

## 2023-10-20 DIAGNOSIS — E038 Other specified hypothyroidism: Secondary | ICD-10-CM

## 2023-10-20 DIAGNOSIS — J4520 Mild intermittent asthma, uncomplicated: Secondary | ICD-10-CM

## 2023-10-20 DIAGNOSIS — R000 Tachycardia, unspecified: Secondary | ICD-10-CM

## 2023-10-20 DIAGNOSIS — Z1211 Encounter for screening for malignant neoplasm of colon: Secondary | ICD-10-CM

## 2023-10-20 DIAGNOSIS — E782 Mixed hyperlipidemia: Secondary | ICD-10-CM | POA: Diagnosis not present

## 2023-10-20 DIAGNOSIS — F411 Generalized anxiety disorder: Secondary | ICD-10-CM

## 2023-10-20 DIAGNOSIS — Z0001 Encounter for general adult medical examination with abnormal findings: Secondary | ICD-10-CM

## 2023-10-20 LAB — LIPID PANEL

## 2023-10-20 MED ORDER — FLUTICASONE PROPIONATE 50 MCG/ACT NA SUSP
2.0000 | Freq: Every day | NASAL | 0 refills | Status: AC
Start: 2023-10-20 — End: ?

## 2023-10-20 MED ORDER — ALBUTEROL SULFATE HFA 108 (90 BASE) MCG/ACT IN AERS
2.0000 | INHALATION_SPRAY | Freq: Four times a day (QID) | RESPIRATORY_TRACT | 2 refills | Status: AC | PRN
Start: 2023-10-20 — End: ?

## 2023-10-20 MED ORDER — MONTELUKAST SODIUM 10 MG PO TABS: 10.0000 mg | ORAL_TABLET | Freq: Every day | ORAL | 1 refills | Status: AC

## 2023-10-20 MED ORDER — BUSPIRONE HCL 5 MG PO TABS: 5.0000 mg | ORAL_TABLET | Freq: Three times a day (TID) | ORAL | 1 refills | Status: DC

## 2023-10-20 MED ORDER — OMEPRAZOLE 20 MG PO CPDR: 20.0000 mg | DELAYED_RELEASE_CAPSULE | Freq: Every day | ORAL | 1 refills | Status: AC

## 2023-10-20 MED ORDER — IBUPROFEN 800 MG PO TABS
800.0000 mg | ORAL_TABLET | Freq: Three times a day (TID) | ORAL | 0 refills | Status: AC
Start: 2023-10-20 — End: 2024-01-18

## 2023-10-20 MED ORDER — LEVOTHYROXINE SODIUM 50 MCG PO TABS
50.0000 ug | ORAL_TABLET | Freq: Every day | ORAL | 0 refills | Status: AC
Start: 2023-10-20 — End: ?

## 2023-10-20 MED ORDER — FLUOXETINE HCL 40 MG PO CAPS
40.0000 mg | ORAL_CAPSULE | Freq: Every day | ORAL | 3 refills | Status: AC
Start: 2023-10-20 — End: ?

## 2023-10-20 MED ORDER — TOPIRAMATE 50 MG PO TABS: 75.0000 mg | ORAL_TABLET | Freq: Two times a day (BID) | ORAL | 1 refills | Status: AC

## 2023-10-20 NOTE — Patient Instructions (Signed)
Health Maintenance, Female Adopting a healthy lifestyle and getting preventive care are important in promoting health and wellness. Ask your health care provider about: The right schedule for you to have regular tests and exams. Things you can do on your own to prevent diseases and keep yourself healthy. What should I know about diet, weight, and exercise? Eat a healthy diet  Eat a diet that includes plenty of vegetables, fruits, low-fat dairy products, and lean protein. Do not eat a lot of foods that are high in solid fats, added sugars, or sodium. Maintain a healthy weight Body mass index (BMI) is used to identify weight problems. It estimates body fat based on height and weight. Your health care provider can help determine your BMI and help you achieve or maintain a healthy weight. Get regular exercise Get regular exercise. This is one of the most important things you can do for your health. Most adults should: Exercise for at least 150 minutes each week. The exercise should increase your heart rate and make you sweat (moderate-intensity exercise). Do strengthening exercises at least twice a week. This is in addition to the moderate-intensity exercise. Spend less time sitting. Even light physical activity can be beneficial. Watch cholesterol and blood lipids Have your blood tested for lipids and cholesterol at 47 years of age, then have this test every 5 years. Have your cholesterol levels checked more often if: Your lipid or cholesterol levels are high. You are older than 47 years of age. You are at high risk for heart disease. What should I know about cancer screening? Depending on your health history and family history, you may need to have cancer screening at various ages. This may include screening for: Breast cancer. Cervical cancer. Colorectal cancer. Skin cancer. Lung cancer. What should I know about heart disease, diabetes, and high blood pressure? Blood pressure and heart  disease High blood pressure causes heart disease and increases the risk of stroke. This is more likely to develop in people who have high blood pressure readings or are overweight. Have your blood pressure checked: Every 3-5 years if you are 61-69 years of age. Every year if you are 48 years old or older. Diabetes Have regular diabetes screenings. This checks your fasting blood sugar level. Have the screening done: Once every three years after age 30 if you are at a normal weight and have a low risk for diabetes. More often and at a younger age if you are overweight or have a high risk for diabetes. What should I know about preventing infection? Hepatitis B If you have a higher risk for hepatitis B, you should be screened for this virus. Talk with your health care provider to find out if you are at risk for hepatitis B infection. Hepatitis C Testing is recommended for: Everyone born from [birth year] through 1965. Anyone with known risk factors for hepatitis C. Sexually transmitted infections (STIs) Get screened for STIs, including gonorrhea and chlamydia, if: You are sexually active and are younger than 47 years of age. You are older than 47 years of age and your health care provider tells you that you are at risk for this type of infection. Your sexual activity has changed since you were last screened, and you are at increased risk for chlamydia or gonorrhea. Ask your health care provider if you are at risk. Ask your health care provider about whether you are at high risk for HIV. Your health care provider may recommend a prescription medicine to help prevent HIV  infection. If you choose to take medicine to prevent HIV, you should first get tested for HIV. You should then be tested every 3 months for as long as you are taking the medicine. Pregnancy If you are about to stop having your period (premenopausal) and you may become pregnant, seek counseling before you get pregnant. Take 400 to 800  micrograms (mcg) of folic acid every day if you become pregnant. Ask for birth control (contraception) if you want to prevent pregnancy. Osteoporosis and menopause Osteoporosis is a disease in which the bones lose minerals and strength with aging. This can result in bone fractures. If you are 53 years old or older, or if you are at risk for osteoporosis and fractures, ask your health care provider if you should: Be screened for bone loss. Take a calcium or vitamin D supplement to lower your risk of fractures. Be given hormone replacement therapy (HRT) to treat symptoms of menopause. Follow these instructions at home: Alcohol use Do not drink alcohol if: Your health care provider tells you not to drink. You are pregnant, may be pregnant, or are planning to become pregnant. If you drink alcohol: Limit how much you have to: 0-1 drink a day. Know how much alcohol is in your drink. In the [place] one drink equals one 12 oz bottle of beer (355 mL), one 5 oz glass of wine (148 mL), or one 1 oz glass of hard liquor (44 mL). Lifestyle Do not use any products that contain nicotine or tobacco. These products include cigarettes, chewing tobacco, and vaping devices, such as e-cigarettes. If you need help quitting, ask your health care provider. Do not use street drugs. Do not share needles. Ask your health care provider for help if you need support or information about quitting drugs. General instructions Schedule regular health, dental, and eye exams. Stay current with your vaccines. Tell your health care provider if: You often feel depressed. You have ever been abused or do not feel safe at home. Summary Adopting a healthy lifestyle and getting preventive care are important in promoting health and wellness. Follow your health care provider's instructions about healthy diet, exercising, and getting tested or screened for diseases. Follow your health care provider's instructions on monitoring your  cholesterol and blood pressure. This information is not intended to replace advice given to you by your health care provider. Make sure you discuss any questions you have with your health care provider. Document Revised: 03/17/2021 Document Reviewed: 03/17/2021 Else[employer] Patient Education  2024 [employer]  Colonoscopy, Adult A colonoscopy is a procedure to look at the entire large intestine. This procedure is done using a long, thin, flexible tube that has a camera on the end. You may have a colonoscopy: As a part of normal colorectal screening. If you have certain symptoms, such as: A low number of red blood cells in your blood (anemia). Diarrhea that does not go away. Pain in your abdomen. Blood in your stool. A colonoscopy can help screen for and diagnose medical problems, including: An abnormal growth of cells or tissue (tumor). Abnormal growths within the lining of your intestine (polyps). Inflammation. Areas of bleeding. Tell your health care provider about: Any allergies you have. All medicines you are taking, including vitamins, herbs, eye drops, creams, and over-the-counter medicines. Any problems you or family members have had with anesthetic medicines. Any bleeding problems you have. Any surgeries you have had. Any medical conditions you have. Any problems you have had with having bowel movements. Whether you  are pregnant or may be pregnant. What are the risks? Generally, this is a safe procedure. However, problems may occur, including: Bleeding. Damage to your intestine. Allergic reactions to medicines given during the procedure. Infection. This is rare. What happens before the procedure? Eating and drinking restrictions Follow instructions from your health care provider about eating or drinking restrictions, which may include: A few days before the procedure: Follow a low-fiber diet. Avoid nuts, seeds, dried fruit, raw fruits, and vegetables. 1-3 days before  the procedure: Eat only gelatin dessert or ice pops. Drink only clear liquids, such as water, clear juice, clear broth or bouillon, black coffee or tea, or clear soft drinks or sports drinks. Avoid liquids that contain red or purple dye. The day of the procedure: Do not eat solid foods. You may continue to drink clear liquids until up to 2 hours before the procedure. Do not eat or drink anything starting 2 hours before the procedure, or within the time period that your health care provider recommends. Bowel prep If you were prescribed a bowel prep to take by mouth (orally) to clean out your colon: Take it as told by your health care provider. Starting the day before your procedure, you will need to drink a large amount of liquid medicine. The liquid will cause you to have many bowel movements of loose stool until your stool becomes almost clear or light green. If your skin or the opening between the buttocks (anus) gets irritated from diarrhea, you may relieve the irritation using: Wipes with medicine in them, such as adult wet wipes with aloe and vitamin E. A product to soothe skin, such as petroleum jelly. If you vomit while drinking the bowel prep: Take a break for up to 60 minutes. Begin the bowel prep again. Call your health care provider if you keep vomiting or you cannot take the bowel prep without vomiting. To clean out your colon, you may also be given: Laxative medicines. These help you have a bowel movement. Instructions for enema use. An enema is liquid medicine injected into your rectum. Medicines Ask your health care provider about: Changing or stopping your regular medicines or supplements. This is especially important if you are taking iron supplements, diabetes medicines, or blood thinners. Taking medicines such as aspirin and ibuprofen. These medicines can thin your blood. Do not take these medicines unless your health care provider tells you to take them. Taking  over-the-counter medicines, vitamins, herbs, and supplements. General instructions Ask your health care provider what steps will be taken to help prevent infection. These may include washing skin with a germ-killing soap. If you will be going home right after the procedure, plan to have a responsible adult: Take you home from the hospital or clinic. You will not be allowed to drive. Care for you for the time you are told. What happens during the procedure?  An IV will be inserted into one of your veins. You will be given a medicine to make you fall asleep (general anesthetic). You will lie on your side with your knees bent. A lubricant will be put on the tube. Then the tube will be: Inserted into your anus. Gently eased through all parts of your large intestine. Air will be sent into your colon to keep it open. This may cause some pressure or cramping. Images will be taken with the camera and will appear on a screen. A small tissue sample may be removed to be looked at under a microscope (biopsy). The tissue  may be sent to a lab for testing if any signs of problems are found. If small polyps are found, they may be removed and checked for cancer cells. When the procedure is finished, the tube will be removed. The procedure may vary among health care providers and hospitals. What happens after the procedure? Your blood pressure, heart rate, breathing rate, and blood oxygen level will be monitored until you leave the hospital or clinic. You may have a small amount of blood in your stool. You may pass gas and have mild cramping or bloating in your abdomen. This is caused by the air that was used to open your colon during the exam. If you were given a sedative during the procedure, it can affect you for several hours. Do not drive or operate machinery until your health care provider says that it is safe. It is up to you to get the results of your procedure. Ask your health care provider, or the  department that is doing the procedure, when your results will be ready. Summary A colonoscopy is a procedure to look at the entire large intestine. Follow instructions from your health care provider about eating and drinking before the procedure. If you were prescribed an oral bowel prep to clean out your colon, take it as told by your health care provider. During the colonoscopy, a flexible tube with a camera on its end is inserted into the anus and then passed into all parts of the large intestine. This information is not intended to replace advice given to you by your health care provider. Make sure you discuss any questions you have with your health care provider. Document Revised: 12/08/2022 Document Reviewed: 06/18/2021 Else[employer] Patient Education  2024 [employer].

## 2023-10-20 NOTE — Progress Notes (Signed)
Summer [name] is a 47 y.o. female presents to office today for annual physical exam examination.    Concerns today include: 1. Feels that her anxiety is not at goal. States that it is intermittent when she feels overwhelmed. States that she feels irritated easily with people in her life. She feels that prozac was working well at one time but is not right now. She has tried atarax in the past and it makes her drowsy. She also tried wellbutrin, but as a solo medication. Does not believe it helped that much. Reports that she used to take xanax a long time ago and that helped her as well. No interest in counseling. Denies SI.   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  Marital status: married  Diet: states that she is trying to do better, working with dietician Using my [employer]  Exercise: walking  Substance use: none Last eye exam: due  Last dental exam: [place], due  Last colonoscopy: due, mother had colon cancer  Last mammogram: due Last pap smear: due next year  Contraceptive: tubes tied  Refills needed today: multiple  Other specialists seen: neurology for migraines  Dermatology exam: declines Fasting today:  no  Immunizations needed: Flu Vaccine: completed at work   Tdap Vaccine: completed   - every 12yrs - (<3 lifetime doses or unknown): all wounds -- look up need for Tetanus IG - (>=3 lifetime doses): clean/minor wound if >2yrs from previous; all other wounds if >16yrs from previous Zoster Vaccine: not due  (those >50yo, once) Pneumonia Vaccine: not due (those w/ risk factors) - (<86yr) Both: Immunocompromised, cochlear implant, CSF leak, asplenic, sickle cell, Chronic Renal Failure - (<62yr) PPSV-23 only: Heart dz, lung disease, DM, tobacco abuse, alcoholism, cirrhosis/liver disease. - (>71yr): PPSV13 then PPSV23 in 6-12mths;  - (>63yr): repeat PPSV23 once if pt received prior to 47yo and 29yrs have passed   Past Medical History:  Diagnosis Date   Angio-edema    Anxiety     Asthma    GERD (gastroesophageal reflux disease)    Mixed hyperlipidemia 03/27/2021   Social History   Socioeconomic History   Marital status: Married    Spouse name: Not on file   Number of children: Not on file   Years of education: Not on file   Highest education level: Not on file  Occupational History   Not on file  Tobacco Use   Smoking status: Former    Current packs/day: 0.00    Average packs/day: 0.5 packs/day for 3.0 years (1.5 ttl pk-yrs)    Types: Cigarettes    Start date: 02/21/1995    Quit date: 02/20/1998    Years since quitting: 25.6   Smokeless tobacco: Never  Vaping Use   Vaping status: Never Used  Substance and Sexual Activity   Alcohol use: No   Drug use: No   Sexual activity: Yes    Birth control/protection: Surgical  Other Topics Concern   Not on file  Social History Narrative   Not on file   Social Determinants of Health   Financial Resource Strain: Not on file  Food Insecurity: Not on file  Transportation Needs: Not on file  Physical Activity: Not on file  Stress: Not on file  Social Connections: Not on file  Intimate Partner Violence: Not on file   Past Surgical History:  Procedure Laterality Date   CHOLECYSTECTOMY N/A 02/21/2014   Procedure: LAPAROSCOPIC CHOLECYSTECTOMY;  Surgeon: Dalia Heading, MD;  Location: [facility];  Service: General;  Laterality: N/A;   GALLBLADDER SURGERY     TUBAL LIGATION     TYMPANOSTOMY TUBE PLACEMENT Bilateral    Family History  Problem Relation Age of Onset   Hypertension Mother    Hypothyroidism Mother    Heart disease Mother    Colon cancer Mother 9   Ovarian cancer Mother    Heart disease Father    Asthma Daughter    Asthma Son    Breast cancer Cousin    Migraines Neg Hx    Headache Neg Hx     Current Outpatient Medications:    albuterol (VENTOLIN HFA) 108 (90 Base) MCG/ACT inhaler, Inhale 2 puffs into the lungs every 6 (six) hours as needed., Disp: 18 g, Rfl: 2   FLUoxetine (PROZAC) 20 MG  capsule, Take 1 capsule by mouth daily., Disp: 90 capsule, Rfl: 0   fluticasone (FLONASE) 50 MCG/ACT nasal spray, Place 2 sprays into both nostrils daily., Disp: 16 g, Rfl: 0   Fluticasone Furoate (ARNUITY ELLIPTA) 100 MCG/ACT AEPB, Inhale 1 puff into the lungs daily at 6 (six) AM., Disp: 30 each, Rfl: 2   Fremanezumab-vfrm (AJOVY) 225 MG/1.5ML SOAJ, Inject 225 mg into the skin every 30 (thirty) days., Disp: 1.5 mL, Rfl: 11   ibuprofen (ADVIL) 800 MG tablet, Take 1 tablet (800 mg total) by mouth 3 (three) times daily., Disp: 270 tablet, Rfl: 0   levocetirizine (XYZAL) 5 MG tablet, Take 1 tablet (5 mg total) by mouth every evening., Disp: 30 tablet, Rfl: 5   levothyroxine (SYNTHROID) 50 MCG tablet, Take 1 tablet by mouth daily., Disp: 90 tablet, Rfl: 0   metoprolol tartrate (LOPRESSOR) 25 MG tablet, Take 1/2 tablet by mouth 2 times daily as needed (high heart rate)., Disp: 180 tablet, Rfl: 1   montelukast (SINGULAIR) 10 MG tablet, Take 1 tablet by mouth at bedtime., Disp: 90 tablet, Rfl: 1   omeprazole (PRILOSEC) 20 MG capsule, Take 1 capsule by mouth daily., Disp: 90 capsule, Rfl: 1   Rimegepant Sulfate (NURTEC) 75 MG TBDP, Dissolve one tablet by mouth once as needed for up to 1 dose. Not to exceed one tablet per 24-hr period. Not to exceed 18 doses/30 days, Disp: 30 tablet, Rfl: 2   topiramate (TOPAMAX) 50 MG tablet, Take 1 and 1/2 tablets by mouth 2 times daily., Disp: 270 tablet, Rfl: 1   valACYclovir (VALTREX) 1000 MG tablet, Take 2 tablets by mouth every 12 hours for 1 day at onset of symptoms of a cold sore. Take as needed., Disp: 30 tablet, Rfl: 1  Allergies  Allergen Reactions   Bee Pollen    Molds & Smuts    Tramadol     Vomiting and nausea     ROS: Review of Systems Review of Systems  All other systems reviewed and are negative.   Physical exam    10/20/2023    2:29 PM 09/23/2023   11:26 AM 07/26/2023    2:33 PM  Vitals with BMI  Height 5\' 3"  5\' 3"  5\' 3"   Weight 236 lbs  237 lbs 238 lbs  BMI 41.82 41.99 42.17  Systolic 98 102   Diastolic 63 67   Pulse 78 86     Physical Exam Constitutional:      General: She is awake. She is not in acute distress.    Appearance: Normal appearance. She is well-developed. She is morbidly obese. She is not ill-appearing, toxic-appearing or diaphoretic.  HENT:     Right Ear: A middle ear effusion is  present. Tympanic membrane is scarred.     Left Ear: A middle ear effusion is present. Tympanic membrane is scarred.     Nose: No congestion or rhinorrhea.     Right Sinus: Maxillary sinus tenderness present. No frontal sinus tenderness.     Left Sinus: Maxillary sinus tenderness present. No frontal sinus tenderness.     Mouth/Throat:     Lips: Pink. No lesions.     Mouth: Mucous membranes are moist.     Tongue: No lesions.     Pharynx: Oropharynx is clear. No pharyngeal swelling, oropharyngeal exudate, posterior oropharyngeal erythema or postnasal drip.     Tonsils: No tonsillar exudate or tonsillar abscesses. 3+ on the right. 3+ on the left.  Neck:     Thyroid: No thyroid mass or thyromegaly.     Trachea: Trachea and phonation normal.  Cardiovascular:     Rate and Rhythm: Normal rate and regular rhythm.     Heart sounds: Normal heart sounds.  Pulmonary:     Effort: Pulmonary effort is normal.     Breath sounds: Normal breath sounds. No stridor, decreased air movement or transmitted upper airway sounds. No decreased breath sounds, wheezing, rhonchi or rales.  Abdominal:     General: Abdomen is flat. Bowel sounds are normal.     Palpations: Abdomen is soft.     Tenderness: There is no abdominal tenderness.     Hernia: No hernia is present.  Musculoskeletal:     Cervical back: Full passive range of motion without pain.     Right lower leg: No edema.     Left lower leg: No edema.  Lymphadenopathy:     Head:     Right side of head: Tonsillar adenopathy present. No submental, submandibular, preauricular or posterior  auricular adenopathy.     Left side of head: No submental, submandibular, tonsillar, preauricular or posterior auricular adenopathy.     Comments: Tender to palpation right tonsillar lymph node   Skin:    General: Skin is warm.     Capillary Refill: Capillary refill takes less than 2 seconds.  Neurological:     General: No focal deficit present.     Mental Status: She is alert, oriented to person, place, and time and easily aroused. Mental status is at baseline.  Psychiatric:        Attention and Perception: Attention and perception normal.        Mood and Affect: Mood and affect normal.        Speech: Speech normal.        Behavior: Behavior normal. Behavior is cooperative.        Thought Content: Thought content normal. Thought content does not include homicidal or suicidal ideation. Thought content does not include homicidal or suicidal plan.        Cognition and Memory: Cognition and memory normal.        Judgment: Judgment normal.        09/23/2023   11:30 AM 06/22/2023   11:24 AM 03/12/2023    1:19 PM  Depression screen PHQ 2/9  Decreased Interest 0 0 0  Down, Depressed, Hopeless 0 0 0  PHQ - 2 Score 0 0 0  Altered sleeping 0  0  Tired, decreased energy 1  1  Change in appetite 0  0  Feeling bad or failure about yourself  0  0  Trouble concentrating 0  0  Moving slowly or fidgety/restless 0  0  Suicidal thoughts 0  0  PHQ-9 Score 1  1  Difficult doing work/chores   Not difficult at all      09/23/2023   11:30 AM 03/12/2023    1:19 PM 05/22/2022    3:46 PM 02/11/2022    9:03 AM  GAD 7 : Generalized Anxiety Score  Nervous, Anxious, on Edge 1 1 1 1   Control/stop worrying 1 0 0 1  Worry too much - different things 0 0 0 1  Trouble relaxing 0 0 1 0  Restless 0 0 0 0  Easily annoyed or irritable 1 1 1 1   Afraid - awful might happen 0 0 0 0  Total GAD 7 Score 3 2 3 4   Anxiety Difficulty Somewhat difficult Not difficult at all Somewhat difficult Not difficult at all   The  10-year ASCVD risk score (Arnett DK, et al., 2019) is: 1%   Values used to calculate the score:     Age: 55 years     Sex: Female     Is Non-Hispanic African American: No     Diabetic: No     Tobacco smoker: No     Systolic Blood Pressure: 98 mmHg     Is BP treated: No     HDL Cholesterol: 35 mg/dL     Total Cholesterol: 190 mg/dL  Assessment/ Plan: Summer [name] here for annual physical exam.  1. Routine general medical examination at a health care facility Discussed with patient to continue healthy lifestyle choices, including diet (rich in fruits, vegetables, and lean proteins, and low in salt and simple carbohydrates) and exercise (at least 30 minutes of moderate physical activity daily). Limit beverages high is sugar. Recommended at least 80-100 oz of water daily.   2. Mild intermittent asthma without complication Well controlled on current regimen. Refill provided.  - albuterol (VENTOLIN HFA) 108 (90 Base) MCG/ACT inhaler; Inhale 2 puffs into the lungs every 6 (six) hours as needed.  Dispense: 18 g; Refill: 2  3. Migraine with aura and without status migrainosus, not intractable Established with neurology. Reviewed note from Lucia Gaskins, MD on 09/10/22.  Refills provided.  - ibuprofen (ADVIL) 800 MG tablet; Take 1 tablet (800 mg total) by mouth 3 (three) times daily.  Dispense: 270 tablet; Refill: 0 - topiramate (TOPAMAX) 50 MG tablet; Take 1 and 1/2 tablets by mouth 2 times daily.  Dispense: 270 tablet; Refill: 1  4. Subclinical hypothyroidism Labs as below. Will communicate results to patient once available. Will await results to determine next steps.  Refill provided.  - levothyroxine (SYNTHROID) 50 MCG tablet; Take 1 tablet by mouth daily.  Dispense: 90 tablet; Refill: 0 - Thyroid Panel With TSH  5. Seasonal allergies Well controlled on current regimen. Refills provided.  - fluticasone (FLONASE) 50 MCG/ACT nasal spray; Place 2 sprays into both nostrils daily.  Dispense: 16 g;  Refill: 0 - montelukast (SINGULAIR) 10 MG tablet; Take 1 tablet by mouth at bedtime.  Dispense: 90 tablet; Refill: 1  6. Gastroesophageal reflux disease without esophagitis Well controlled on current regimen. Refills provided.  - omeprazole (PRILOSEC) 20 MG capsule; Take 1 capsule by mouth daily.  Dispense: 90 capsule; Refill: 1  7. Screening for colon cancer Family history of colon cancer in mother.  - Ambulatory referral to Gastroenterology  8. Screening mammogram for breast cancer - [facility] 3D SCREENING MAMMOGRAM BILATERAL BREAST; Future  9. GAD (generalized anxiety disorder) Not at goal per patient history. Denies SI. Would like something as needed. Will increase prozac and  add in buspar. Will plan for patient to take 2x per day scheduled and a third time during the day as needed.  - FLUoxetine (PROZAC) 40 MG capsule; Take 1 capsule (40 mg total) by mouth daily.  Dispense: 90 capsule; Refill: 3 - busPIRone (BUSPAR) 5 MG tablet; Take 1 tablet (5 mg total) by mouth 3 (three) times daily.  Dispense: 90 tablet; Refill: 1  10. Mixed hyperlipidemia Labs as below. Will communicate results to patient once available. Will await results to determine next steps.  Not fasting. Not on statin therapy.  Reviewed ASCVD risk score.  - Lipid panel  11. Tachycardia Well controlled on current therapy. No refills needed. Labs as below. Will communicate results to patient once available. Will await results to determine next steps.  - CMP14+EGFR - CBC with Differential/Platelet  12. Morbid obesity (HCC) Praised patient on her work with nutrition and diet and lifestyle modifications. Labs as below. Will communicate results to patient once available. Will await results to determine next steps.  - VITAMIN D 25 Hydroxy (Vit-D Deficiency, Fractures)  Patient to follow up in 1 year for annual exam or sooner if needed.  The above assessment and management plan was discussed with the patient. The patient  verbalized understanding of and has agreed to the management plan. Patient is aware to call the clinic if symptoms persist or worsen. Patient is aware when to return to the clinic for a follow-up visit. Patient educated on when it is appropriate to go to the emergency department.   Neale Burly, DNP-FNP [facility] [address] [place], [place] [address] [phone]

## 2023-10-21 LAB — CBC WITH DIFFERENTIAL/PLATELET
Basophils Absolute: 0 10*3/uL (ref 0.0–0.2)
Basos: 0 %
EOS (ABSOLUTE): 0.3 10*3/uL (ref 0.0–0.4)
Eos: 4 %
Hematocrit: 44.3 % (ref 34.0–46.6)
Hemoglobin: 14.2 g/dL (ref 11.1–15.9)
Immature Grans (Abs): 0 10*3/uL (ref 0.0–0.1)
Immature Granulocytes: 0 %
Lymphocytes Absolute: 2.6 10*3/uL (ref 0.7–3.1)
Lymphs: 33 %
MCH: 28.7 pg (ref 26.6–33.0)
MCHC: 32.1 g/dL (ref 31.5–35.7)
MCV: 90 fL (ref 79–97)
Monocytes Absolute: 0.5 10*3/uL (ref 0.1–0.9)
Monocytes: 6 %
Neutrophils Absolute: 4.6 10*3/uL (ref 1.4–7.0)
Neutrophils: 57 %
Platelets: 319 10*3/uL (ref 150–450)
RBC: 4.94 x10E6/uL (ref 3.77–5.28)
RDW: 12.9 % (ref 11.7–15.4)
WBC: 8 10*3/uL (ref 3.4–10.8)

## 2023-10-21 LAB — CMP14+EGFR
ALT: 11 IU/L (ref 0–32)
AST: 19 IU/L (ref 0–40)
Albumin: 4.1 g/dL (ref 3.9–4.9)
Alkaline Phosphatase: 89 IU/L (ref 44–121)
BUN/Creatinine Ratio: 11 (ref 9–23)
BUN: 10 mg/dL (ref 6–24)
Bilirubin Total: 0.2 mg/dL (ref 0.0–1.2)
CO2: 21 mmol/L (ref 20–29)
Calcium: 9 mg/dL (ref 8.7–10.2)
Chloride: 104 mmol/L (ref 96–106)
Creatinine, Ser: 0.88 mg/dL (ref 0.57–1.00)
Globulin, Total: 3 g/dL (ref 1.5–4.5)
Glucose: 87 mg/dL (ref 70–99)
Potassium: 4.4 mmol/L (ref 3.5–5.2)
Sodium: 139 mmol/L (ref 134–144)
Total Protein: 7.1 g/dL (ref 6.0–8.5)
eGFR: 82 mL/min/{1.73_m2} (ref >59)

## 2023-10-21 LAB — THYROID PANEL WITH TSH
Free Thyroxine Index: 1.9 (ref 1.2–4.9)
T3 Uptake Ratio: 22 % — ABNORMAL LOW (ref 24–39)
T4, Total: 8.5 ug/dL (ref 4.5–12.0)
TSH: 4.23 u[IU]/mL (ref 0.450–4.500)

## 2023-10-21 LAB — LIPID PANEL
Cholesterol, Total: 166 mg/dL (ref 100–199)
HDL: 36 mg/dL — ABNORMAL LOW (ref >39)
LDL CALC COMMENT:: 4.6 ratio — ABNORMAL HIGH (ref 0.0–4.4)
LDL Chol Calc (NIH): 101 mg/dL — ABNORMAL HIGH (ref 0–99)
Triglycerides: 163 mg/dL — ABNORMAL HIGH (ref 0–149)
VLDL Cholesterol Cal: 29 mg/dL (ref 5–40)

## 2023-10-21 LAB — VITAMIN D 25 HYDROXY (VIT D DEFICIENCY, FRACTURES): Vit D, 25-Hydroxy: 26.1 ng/mL — ABNORMAL LOW (ref 30.0–100.0)

## 2023-10-22 NOTE — Progress Notes (Signed)
Mild changes on cholesterol panel. Recommend avoiding fried, greasy foods. Increasing baked, lean meats, vegetables and fresh fruits. Exercise encouraged - at least 150 minutes per week and advance as tolerated. The 10-year ASCVD risk score (Arnett DK, et al., 2019) is: 0.8%.  Slight variation in T3 Uptake ratio, remainder of panel is within normal range, will not change dose at this time.  Vitamin D slightly low. Recommend supplement of [phone] international units daily.  All other labs normal.

## 2023-10-25 ENCOUNTER — Encounter: Payer: Self-pay | Admitting: *Deleted

## 2023-10-26 ENCOUNTER — Ambulatory Visit: Payer: Commercial Managed Care - PPO | Admitting: Nutrition

## 2023-12-13 ENCOUNTER — Ambulatory Visit: Payer: Commercial Managed Care - PPO | Admitting: Nutrition

## 2023-12-21 ENCOUNTER — Ambulatory Visit: Payer: Self-pay | Admitting: Nutrition

## 2023-12-21 ENCOUNTER — Ambulatory Visit (INDEPENDENT_AMBULATORY_CARE_PROVIDER_SITE_OTHER): Payer: 59 | Admitting: Family Medicine

## 2023-12-21 ENCOUNTER — Encounter: Payer: Self-pay | Admitting: Family Medicine

## 2023-12-21 ENCOUNTER — Other Ambulatory Visit (HOSPITAL_COMMUNITY): Payer: Self-pay

## 2023-12-21 VITALS — BP 106/71 | HR 72 | Temp 97.0°F | Ht 63.0 in | Wt 241.8 lb

## 2023-12-21 DIAGNOSIS — F411 Generalized anxiety disorder: Secondary | ICD-10-CM | POA: Diagnosis not present

## 2023-12-21 DIAGNOSIS — G43109 Migraine with aura, not intractable, without status migrainosus: Secondary | ICD-10-CM | POA: Diagnosis not present

## 2023-12-21 MED ORDER — NURTEC 75 MG PO TBDP
75.0000 mg | ORAL_TABLET | Freq: Once | ORAL | 2 refills | Status: AC | PRN
  Filled 2023-12-21: qty 18, 30d supply, fill #0
  Filled 2023-12-30: qty 30, 57d supply, fill #0
  Filled 2024-02-03: qty 18, 30d supply, fill #0
  Filled 2024-02-21: qty 15, 29d supply, fill #0
  Filled 2024-10-16: qty 15, 29d supply, fill #1

## 2023-12-21 MED ORDER — HYDROXYZINE HCL 25 MG PO TABS
25.0000 mg | ORAL_TABLET | Freq: Three times a day (TID) | ORAL | 0 refills | Status: AC | PRN
  Filled 2023-12-21 – 2024-02-21 (×4): qty 60, 20d supply, fill #0

## 2023-12-21 NOTE — Progress Notes (Signed)
Subjective:  Patient ID: Summer [name], female    DOB: Oct 16, 1976, 48 y.o.   MRN: [id number]  Patient Care Team: Ellamae Sia Aleen Campi, FNP as PCP - General (Family Medicine)   Chief Complaint:  Anxiety (2 month follow up )   HPI: Summer [name] is a 48 y.o. female presenting on 12/21/2023 for Anxiety (2 month follow up )   1. Migraine with aura and without status migrainosus, not intractable Is established with neurology and plans to follow up. Needs refill of nurtec. Denies side effects. States that it works well.   2. GAD (generalized anxiety disorder) States that she is not taking buspar. Reports that she was having "weird, racing" thoughts. States that she tried it for 2 weeks. States that anxiety is still up and down. States that prozac is helping. States that she has some stressors that she would like something as needed for episodes of acute anxiety twice weekly. Denies SI. Denies desire for counseling.   Relevant past medical, surgical, family, and social history reviewed and updated as indicated.  Allergies and medications reviewed and updated. Data reviewed: Chart in Epic.   Past Medical History:  Diagnosis Date   Angio-edema    Anxiety    Asthma    GERD (gastroesophageal reflux disease)    Mixed hyperlipidemia 03/27/2021    Past Surgical History:  Procedure Laterality Date   CHOLECYSTECTOMY N/A 02/21/2014   Procedure: LAPAROSCOPIC CHOLECYSTECTOMY;  Surgeon: Dalia Heading, MD;  Location: [facility];  Service: General;  Laterality: N/A;   GALLBLADDER SURGERY     TUBAL LIGATION     TYMPANOSTOMY TUBE PLACEMENT Bilateral     Social History   Socioeconomic History   Marital status: Married    Spouse name: Not on file   Number of children: Not on file   Years of education: Not on file   Highest education level: Not on file  Occupational History   Not on file  Tobacco Use   Smoking status: Former    Current packs/day: 0.00    Average packs/day: 0.5  packs/day for 3.0 years (1.5 ttl pk-yrs)    Types: Cigarettes    Start date: 02/21/1995    Quit date: 02/20/1998    Years since quitting: 25.8   Smokeless tobacco: Never  Vaping Use   Vaping status: Never Used  Substance and Sexual Activity   Alcohol use: No   Drug use: No   Sexual activity: Yes    Birth control/protection: Surgical  Other Topics Concern   Not on file  Social History Narrative   Not on file   Social Drivers of Health   Financial Resource Strain: Not on file  Food Insecurity: Not on file  Transportation Needs: Not on file  Physical Activity: Not on file  Stress: Not on file  Social Connections: Not on file  Intimate Partner Violence: Not on file    Outpatient Encounter Medications as of 12/21/2023  Medication Sig   albuterol (VENTOLIN HFA) 108 (90 Base) MCG/ACT inhaler Inhale 2 puffs into the lungs every 6 (six) hours as needed.   FLUoxetine (PROZAC) 40 MG capsule Take 1 capsule (40 mg total) by mouth daily.   fluticasone (FLONASE) 50 MCG/ACT nasal spray Place 2 sprays into both nostrils daily.   Fremanezumab-vfrm (AJOVY) 225 MG/1.5ML SOAJ Inject 225 mg into the skin every 30 (thirty) days.   ibuprofen (ADVIL) 800 MG tablet Take 1 tablet (800 mg total) by mouth 3 (three) times daily.  levocetirizine (XYZAL) 5 MG tablet Take 1 tablet (5 mg total) by mouth every evening.   levothyroxine (SYNTHROID) 50 MCG tablet Take 1 tablet by mouth daily.   metoprolol tartrate (LOPRESSOR) 25 MG tablet Take 1/2 tablet by mouth 2 times daily as needed (high heart rate).   montelukast (SINGULAIR) 10 MG tablet Take 1 tablet by mouth at bedtime.   omeprazole (PRILOSEC) 20 MG capsule Take 1 capsule by mouth daily.   Rimegepant Sulfate (NURTEC) 75 MG TBDP Dissolve one tablet by mouth once as needed for up to 1 dose. Not to exceed one tablet per 24-hr period. Not to exceed 18 doses/30 days   topiramate (TOPAMAX) 50 MG tablet Take 1 and 1/2 tablets by mouth 2 times daily.    valACYclovir (VALTREX) 1000 MG tablet Take 2 tablets by mouth every 12 hours for 1 day at onset of symptoms of a cold sore. Take as needed.   busPIRone (BUSPAR) 5 MG tablet Take 1 tablet (5 mg total) by mouth 3 (three) times daily. (Patient not taking: Reported on 12/21/2023)   No facility-administered encounter medications on file as of 12/21/2023.    Allergies  Allergen Reactions   Bee Pollen    Molds & Smuts    Tramadol     Vomiting and nausea    Review of Systems As per HPI  Objective:  BP 106/71   Pulse 72   Temp (!) 97 F (36.1 C)   Ht 5\' 3"  (1.6 m)   Wt 241 lb 12.8 oz (109.7 kg)   SpO2 98%   BMI 42.83 kg/m    Wt Readings from Last 3 Encounters:  12/21/23 241 lb 12.8 oz (109.7 kg)  10/20/23 236 lb (107 kg)  09/23/23 237 lb (107.5 kg)   Physical Exam Constitutional:      General: She is awake. She is not in acute distress.    Appearance: Normal appearance. She is well-developed and well-groomed. She is morbidly obese. She is not ill-appearing, toxic-appearing or diaphoretic.  Cardiovascular:     Rate and Rhythm: Normal rate and regular rhythm.     Pulses: Normal pulses.          Radial pulses are 2+ on the right side and 2+ on the left side.       Posterior tibial pulses are 2+ on the right side and 2+ on the left side.     Heart sounds: Normal heart sounds. No murmur heard.    No gallop.  Pulmonary:     Effort: Pulmonary effort is normal. No respiratory distress.     Breath sounds: Normal breath sounds. No stridor. No wheezing, rhonchi or rales.  Musculoskeletal:     Cervical back: Full passive range of motion without pain and neck supple.     Right lower leg: No edema.     Left lower leg: No edema.  Skin:    General: Skin is warm.     Capillary Refill: Capillary refill takes less than 2 seconds.  Neurological:     General: No focal deficit present.     Mental Status: She is alert, oriented to person, place, and time and easily aroused. Mental status is at  baseline.     GCS: GCS eye subscore is 4. GCS verbal subscore is 5. GCS motor subscore is 6.     Motor: No weakness.  Psychiatric:        Attention and Perception: Attention and perception normal.        Mood  and Affect: Mood and affect normal.        Speech: Speech normal.        Behavior: Behavior normal. Behavior is cooperative.        Thought Content: Thought content normal. Thought content does not include homicidal or suicidal ideation. Thought content does not include homicidal or suicidal plan.        Cognition and Memory: Cognition and memory normal.        Judgment: Judgment normal.     Results for orders placed or performed in visit on 10/20/23  VITAMIN D 25 Hydroxy (Vit-D Deficiency, Fractures)   Collection Time: 10/20/23  3:15 PM  Result Value Ref Range   Vit D, 25-Hydroxy 26.1 (L) 30.0 - 100.0 ng/mL  Thyroid Panel With TSH   Collection Time: 10/20/23  3:15 PM  Result Value Ref Range   TSH 4.230 0.450 - 4.500 uIU/mL   T4, Total 8.5 4.5 - 12.0 ug/dL   T3 Uptake Ratio 22 (L) 24 - 39 %   Free Thyroxine Index 1.9 1.2 - 4.9  Lipid panel   Collection Time: 10/20/23  3:15 PM  Result Value Ref Range   Cholesterol, Total 166 100 - 199 mg/dL   Triglycerides 098 (H) 0 - 149 mg/dL   HDL 36 (L) >11 mg/dL   VLDL Cholesterol Cal 29 5 - 40 mg/dL   LDL Chol Calc (NIH) 914 (H) 0 - 99 mg/dL   Chol/HDL Ratio 4.6 (H) 0.0 - 4.4 ratio  CMP14+EGFR   Collection Time: 10/20/23  3:15 PM  Result Value Ref Range   Glucose 87 70 - 99 mg/dL   BUN 10 6 - 24 mg/dL   Creatinine, Ser 7.82 0.57 - 1.00 mg/dL   eGFR 82 >95 AO/ZHY/8.65   BUN/Creatinine Ratio 11 9 - 23   Sodium 139 134 - 144 mmol/L   Potassium 4.4 3.5 - 5.2 mmol/L   Chloride 104 96 - 106 mmol/L   CO2 21 20 - 29 mmol/L   Calcium 9.0 8.7 - 10.2 mg/dL   Total Protein 7.1 6.0 - 8.5 g/dL   Albumin 4.1 3.9 - 4.9 g/dL   Globulin, Total 3.0 1.5 - 4.5 g/dL   Bilirubin Total 0.2 0.0 - 1.2 mg/dL   Alkaline Phosphatase 89 44 - 121 IU/L    AST 19 0 - 40 IU/L   ALT 11 0 - 32 IU/L  CBC with Differential/Platelet   Collection Time: 10/20/23  3:15 PM  Result Value Ref Range   WBC 8.0 3.4 - 10.8 x10E3/uL   RBC 4.94 3.77 - 5.28 x10E6/uL   Hemoglobin 14.2 11.1 - 15.9 g/dL   Hematocrit 78.4 69.6 - 46.6 %   MCV 90 79 - 97 fL   MCH 28.7 26.6 - 33.0 pg   MCHC 32.1 31.5 - 35.7 g/dL   RDW 29.5 28.4 - 13.2 %   Platelets 319 150 - 450 x10E3/uL   Neutrophils 57 Not Estab. %   Lymphs 33 Not Estab. %   Monocytes 6 Not Estab. %   Eos 4 Not Estab. %   Basos 0 Not Estab. %   Neutrophils Absolute 4.6 1.4 - 7.0 x10E3/uL   Lymphocytes Absolute 2.6 0.7 - 3.1 x10E3/uL   Monocytes Absolute 0.5 0.1 - 0.9 x10E3/uL   EOS (ABSOLUTE) 0.3 0.0 - 0.4 x10E3/uL   Basophils Absolute 0.0 0.0 - 0.2 x10E3/uL   Immature Granulocytes 0 Not Estab. %   Immature Grans (Abs) 0.0 0.0 - 0.1 x10E3/uL  09/23/2023   11:30 AM 06/22/2023   11:24 AM 03/12/2023    1:19 PM 05/22/2022    3:45 PM 02/11/2022    9:03 AM  Depression screen PHQ 2/9  Decreased Interest 0 0 0 1 0  Down, Depressed, Hopeless 0 0 0 0 1  PHQ - 2 Score 0 0 0 1 1  Altered sleeping 0  0 0 0  Tired, decreased energy 1  1 1 1   Change in appetite 0  0 0 0  Feeling bad or failure about yourself  0  0 0 0  Trouble concentrating 0  0 0 0  Moving slowly or fidgety/restless 0  0 0 0  Suicidal thoughts 0  0 0 0  PHQ-9 Score 1  1 2 2   Difficult doing work/chores   Not difficult at all Not difficult at all Not difficult at all       09/23/2023   11:30 AM 03/12/2023    1:19 PM 05/22/2022    3:46 PM 02/11/2022    9:03 AM  GAD 7 : Generalized Anxiety Score  Nervous, Anxious, on Edge 1 1 1 1   Control/stop worrying 1 0 0 1  Worry too much - different things 0 0 0 1  Trouble relaxing 0 0 1 0  Restless 0 0 0 0  Easily annoyed or irritable 1 1 1 1   Afraid - awful might happen 0 0 0 0  Total GAD 7 Score 3 2 3 4   Anxiety Difficulty Somewhat difficult Not difficult at all Somewhat difficult Not  difficult at all   Pertinent labs & imaging results that were available during my care of the patient were reviewed by me and considered in my medical decision making.  Assessment & Plan:  Summer [name] was seen today for anxiety.  Diagnoses and all orders for this visit: 1. Migraine with aura and without status migrainosus, not intractable Well controlled. Established with neurology. Will provide refill as below.  - Rimegepant Sulfate (NURTEC) 75 MG TBDP; Dissolve one tablet by mouth once as needed for up to 1 dose. Not to exceed one tablet per 24-hr period. Not to exceed 18 doses/30 days  Dispense: 30 tablet; Refill: 2  2. GAD (generalized anxiety disorder) Denies SI. Denies desire for counseling. Will start medication as below to trial for acute episodes of anxiety. Discussed side effects. Patient taking prozac 40 mg, can consider increasing further to 60 mg daily.  - hydrOXYzine (ATARAX) 25 MG tablet; Take 1 tablet (25 mg total) by mouth 3 (three) times daily as needed for anxiety.  Dispense: 60 tablet; Refill: 0  Continue all other maintenance medications.  Follow up plan: Return in about 3 months (around 03/19/2024) for Chronic Condition Follow up.   Continue healthy lifestyle choices, including diet (rich in fruits, vegetables, and lean proteins, and low in salt and simple carbohydrates) and exercise (at least 30 minutes of moderate physical activity daily).  Written and verbal instructions provided   The above assessment and management plan was discussed with the patient. The patient verbalized understanding of and has agreed to the management plan. Patient is aware to call the clinic if they develop any new symptoms or if symptoms persist or worsen. Patient is aware when to return to the clinic for a follow-up visit. Patient educated on when it is appropriate to go to the emergency department.   Neale Burly, DNP-FNP [facility] [address] [place], [place] [address] [phone]

## 2023-12-22 ENCOUNTER — Other Ambulatory Visit: Payer: Self-pay

## 2023-12-23 ENCOUNTER — Other Ambulatory Visit (HOSPITAL_COMMUNITY): Payer: Self-pay | Admitting: Family Medicine

## 2023-12-23 DIAGNOSIS — Z1231 Encounter for screening mammogram for malignant neoplasm of breast: Secondary | ICD-10-CM

## 2023-12-27 ENCOUNTER — Other Ambulatory Visit: Payer: Self-pay

## 2023-12-29 ENCOUNTER — Ambulatory Visit (HOSPITAL_COMMUNITY): Payer: 59

## 2023-12-30 ENCOUNTER — Other Ambulatory Visit (HOSPITAL_COMMUNITY): Payer: Self-pay

## 2024-01-03 ENCOUNTER — Ambulatory Visit (HOSPITAL_COMMUNITY)
Admission: RE | Admit: 2024-01-03 | Discharge: 2024-01-03 | Disposition: A | Discharge status: Home / Self Care | Payer: 59 | Source: Ambulatory Visit | Attending: Family Medicine | Admitting: Family Medicine

## 2024-01-03 ENCOUNTER — Encounter (HOSPITAL_COMMUNITY): Payer: Self-pay

## 2024-01-03 DIAGNOSIS — Z1231 Encounter for screening mammogram for malignant neoplasm of breast: Secondary | ICD-10-CM | POA: Diagnosis not present

## 2024-01-06 ENCOUNTER — Encounter (HOSPITAL_COMMUNITY): Payer: Self-pay | Admitting: Family Medicine

## 2024-01-06 ENCOUNTER — Other Ambulatory Visit (HOSPITAL_COMMUNITY): Payer: Self-pay | Admitting: Family Medicine

## 2024-01-06 DIAGNOSIS — R928 Other abnormal and inconclusive findings on diagnostic imaging of breast: Secondary | ICD-10-CM

## 2024-01-17 ENCOUNTER — Telehealth: Payer: Self-pay

## 2024-01-17 NOTE — Telephone Encounter (Signed)
 Pharmacy Patient Advocate Encounter   Received notification from CoverMyMeds that prior authorization for Nurtec 75MG  dispersible tablets is required/requested.   Insurance verification completed.   The patient is insured through [facility] .   Per test claim: PA required; PA submitted to above mentioned insurance via CoverMyMeds Key/confirmation #/EOC [place] Status is pending

## 2024-01-19 NOTE — Telephone Encounter (Signed)
 Insurance required additional info. Filled out and faxed back to [phone]

## 2024-01-25 ENCOUNTER — Ambulatory Visit: Payer: Self-pay | Admitting: Nutrition

## 2024-01-25 ENCOUNTER — Other Ambulatory Visit (HOSPITAL_COMMUNITY): Payer: Self-pay

## 2024-01-25 NOTE — Telephone Encounter (Signed)
 Pharmacy Patient Advocate Encounter  Received notification from [facility] that Prior Authorization for Nurtec  has been APPROVED from 01/19/2024 to 01/17/2025. Ran test claim, Copay is $19.97. This test claim was processed through [facility]- copay amounts may vary at other pharmacies due to pharmacy/plan contracts, or as the patient moves through the different stages of their insurance plan.   [employer], the mfr of NURTEC ODT 75 MG TABLET paid 1527.13 toward your plan copay. $1610.96 out of $7000.00 in benefits remaining.

## 2024-02-03 ENCOUNTER — Ambulatory Visit (HOSPITAL_COMMUNITY)
Admission: RE | Admit: 2024-02-03 | Discharge: 2024-02-03 | Disposition: A | Discharge status: Home / Self Care | Source: Ambulatory Visit | Attending: Family Medicine | Admitting: Family Medicine

## 2024-02-03 ENCOUNTER — Other Ambulatory Visit (HOSPITAL_COMMUNITY): Payer: Self-pay | Admitting: Family Medicine

## 2024-02-03 ENCOUNTER — Encounter (HOSPITAL_COMMUNITY): Payer: Self-pay

## 2024-02-03 ENCOUNTER — Other Ambulatory Visit (HOSPITAL_COMMUNITY): Payer: Self-pay

## 2024-02-03 DIAGNOSIS — R928 Other abnormal and inconclusive findings on diagnostic imaging of breast: Secondary | ICD-10-CM | POA: Insufficient documentation

## 2024-02-03 DIAGNOSIS — R92322 Mammographic fibroglandular density, left breast: Secondary | ICD-10-CM | POA: Diagnosis not present

## 2024-02-03 DIAGNOSIS — N6325 Unspecified lump in the left breast, overlapping quadrants: Secondary | ICD-10-CM | POA: Diagnosis not present

## 2024-02-07 NOTE — Progress Notes (Signed)
 Patient to follow up with biopsy as planned.

## 2024-02-08 ENCOUNTER — Other Ambulatory Visit (HOSPITAL_COMMUNITY): Payer: 59

## 2024-02-08 ENCOUNTER — Encounter (HOSPITAL_COMMUNITY): Payer: 59

## 2024-02-08 ENCOUNTER — Other Ambulatory Visit (HOSPITAL_COMMUNITY): Payer: Self-pay | Admitting: Family Medicine

## 2024-02-08 DIAGNOSIS — R928 Other abnormal and inconclusive findings on diagnostic imaging of breast: Secondary | ICD-10-CM

## 2024-02-10 ENCOUNTER — Encounter (HOSPITAL_COMMUNITY): Payer: Self-pay

## 2024-02-10 ENCOUNTER — Ambulatory Visit (HOSPITAL_COMMUNITY)
Admission: RE | Admit: 2024-02-10 | Discharge: 2024-02-10 | Discharge status: Home / Self Care | Source: Ambulatory Visit | Attending: Family Medicine

## 2024-02-10 ENCOUNTER — Ambulatory Visit (HOSPITAL_COMMUNITY)
Admission: RE | Admit: 2024-02-10 | Discharge: 2024-02-10 | Disposition: A | Discharge status: Home / Self Care | Source: Ambulatory Visit | Attending: Family Medicine | Admitting: Family Medicine

## 2024-02-10 DIAGNOSIS — R928 Other abnormal and inconclusive findings on diagnostic imaging of breast: Secondary | ICD-10-CM

## 2024-02-10 DIAGNOSIS — N6325 Unspecified lump in the left breast, overlapping quadrants: Secondary | ICD-10-CM | POA: Diagnosis not present

## 2024-02-10 DIAGNOSIS — N6012 Diffuse cystic mastopathy of left breast: Secondary | ICD-10-CM | POA: Diagnosis not present

## 2024-02-10 HISTORY — PX: BREAST BIOPSY: SHX20

## 2024-02-10 MED ORDER — LIDOCAINE HCL (PF) 2 % IJ SOLN
10.0000 mL | Freq: Once | INTRAMUSCULAR | Status: AC
  Administered 2024-02-10: 10 mL

## 2024-02-10 MED ORDER — LIDOCAINE-EPINEPHRINE (PF) 1 %-1:200000 IJ SOLN
10.0000 mL | Freq: Once | INTRAMUSCULAR | Status: AC
  Administered 2024-02-10: 10 mL via INTRADERMAL

## 2024-02-10 NOTE — Progress Notes (Signed)
 PT tolerated Left breast biopsy well today with NAD noted. PT verbalized understanding of discharge instructions. PT ambulated back to the mammogram area this time and given ice pack to use.

## 2024-02-11 ENCOUNTER — Other Ambulatory Visit: Payer: Self-pay

## 2024-02-11 ENCOUNTER — Telehealth: Payer: Self-pay

## 2024-02-11 ENCOUNTER — Other Ambulatory Visit (HOSPITAL_COMMUNITY): Payer: Self-pay

## 2024-02-11 ENCOUNTER — Encounter: Payer: Self-pay | Admitting: Family Medicine

## 2024-02-11 LAB — SURGICAL PATHOLOGY

## 2024-02-11 NOTE — Telephone Encounter (Signed)
 Copied from CRM [id number]. Topic: Clinical - Lab/Test Results >> Feb 11, 2024  3:56 PM Fuller Mandril wrote: Reason for CRM: Patient returned missed call. Call was for labs. Read note as written by provider. Patient understood. Also told her she can access on MyChart. No Further questions at this time. Thank You.

## 2024-02-11 NOTE — Progress Notes (Signed)
 Negative for carcinoma. Continue screenings.

## 2024-02-11 NOTE — Progress Notes (Signed)
Patient has follow up imaging  scheduled.

## 2024-02-14 ENCOUNTER — Other Ambulatory Visit (HOSPITAL_COMMUNITY): Payer: Self-pay

## 2024-02-15 ENCOUNTER — Encounter: Payer: Self-pay | Admitting: Family Medicine

## 2024-02-15 NOTE — Progress Notes (Signed)
 Negative for carcinoma. Resume annual screening.

## 2024-02-16 ENCOUNTER — Telehealth (INDEPENDENT_AMBULATORY_CARE_PROVIDER_SITE_OTHER): Admitting: Family Medicine

## 2024-02-16 ENCOUNTER — Other Ambulatory Visit: Payer: Self-pay

## 2024-02-16 DIAGNOSIS — J302 Other seasonal allergic rhinitis: Secondary | ICD-10-CM

## 2024-02-16 DIAGNOSIS — R0981 Nasal congestion: Secondary | ICD-10-CM | POA: Diagnosis not present

## 2024-02-16 DIAGNOSIS — J3089 Other allergic rhinitis: Secondary | ICD-10-CM | POA: Diagnosis not present

## 2024-02-16 DIAGNOSIS — J4520 Mild intermittent asthma, uncomplicated: Secondary | ICD-10-CM

## 2024-02-16 MED ORDER — CETIRIZINE HCL 10 MG PO TABS
10.0000 mg | ORAL_TABLET | Freq: Every day | ORAL | 0 refills | Status: AC
Start: 2024-02-16 — End: ?

## 2024-02-16 MED ORDER — PREDNISONE 20 MG PO TABS: 40.0000 mg | ORAL_TABLET | Freq: Every day | ORAL | 0 refills | Status: AC

## 2024-02-16 NOTE — Progress Notes (Signed)
 Virtual Visit via Video Note  I connected with Summer [name] on 02/16/24 at 11:35 AM EDT by a video enabled telemedicine application and verified that I am speaking with the correct person using two identifiers.  Patient Location: Home Provider Location: Office/Clinic  I discussed the limitations, risks, security, and privacy concerns of performing an evaluation and management service by video and the availability of in person appointments. I also discussed with the patient that there may be a patient responsible charge related to this service. The patient expressed understanding and agreed to proceed.  Subjective: PCP: Arrie Senate, FNP  Chief Complaint  Patient presents with   Sinusitis   HPI States that she feels like she has a sinus infection  Reports that it started Monday night  Reports facial pain, especially under eyes. Endorses cough, slight drainage, ear pain. Denies sore throat. Denies fever. Triggered by weather and pollen. Denies trouble breathing. Trying some OTC sinus medications. States that it is not helping as much. Compliant w/ singulair. Using nasal spray. She is taking xyzal, taking it for a few years.   ROS: Per HPI  Current Outpatient Medications:    albuterol (VENTOLIN HFA) 108 (90 Base) MCG/ACT inhaler, Inhale 2 puffs into the lungs every 6 (six) hours as needed., Disp: 18 g, Rfl: 2   busPIRone (BUSPAR) 5 MG tablet, Take 1 tablet (5 mg total) by mouth 3 (three) times daily. (Patient not taking: Reported on 12/21/2023), Disp: 90 tablet, Rfl: 1   FLUoxetine (PROZAC) 40 MG capsule, Take 1 capsule (40 mg total) by mouth daily., Disp: 90 capsule, Rfl: 3   fluticasone (FLONASE) 50 MCG/ACT nasal spray, Place 2 sprays into both nostrils daily., Disp: 16 g, Rfl: 0   Fremanezumab-vfrm (AJOVY) 225 MG/1.5ML SOAJ, Inject 225 mg into the skin every 30 (thirty) days., Disp: 1.5 mL, Rfl: 11   hydrOXYzine (ATARAX) 25 MG tablet, Take 1 tablet (25 mg total) by  mouth 3 (three) times daily as needed for anxiety., Disp: 60 tablet, Rfl: 0   levocetirizine (XYZAL) 5 MG tablet, Take 1 tablet (5 mg total) by mouth every evening., Disp: 30 tablet, Rfl: 5   levothyroxine (SYNTHROID) 50 MCG tablet, Take 1 tablet by mouth daily., Disp: 90 tablet, Rfl: 0   metoprolol tartrate (LOPRESSOR) 25 MG tablet, Take 1/2 tablet by mouth 2 times daily as needed (high heart rate)., Disp: 180 tablet, Rfl: 1   montelukast (SINGULAIR) 10 MG tablet, Take 1 tablet by mouth at bedtime., Disp: 90 tablet, Rfl: 1   omeprazole (PRILOSEC) 20 MG capsule, Take 1 capsule by mouth daily., Disp: 90 capsule, Rfl: 1   Rimegepant Sulfate (NURTEC) 75 MG TBDP, Dissolve one tablet by mouth once as needed for up to 1 dose. Not to exceed one tablet per 24-hr period. Not to exceed 18 doses/30 days, Disp: 30 tablet, Rfl: 2   topiramate (TOPAMAX) 50 MG tablet, Take 1 and 1/2 tablets by mouth 2 times daily., Disp: 270 tablet, Rfl: 1   valACYclovir (VALTREX) 1000 MG tablet, Take 2 tablets by mouth every 12 hours for 1 day at onset of symptoms of a cold sore. Take as needed., Disp: 30 tablet, Rfl: 1  Observations/Objective: There were no vitals filed for this visit. Physical Exam Constitutional:      General: She is awake. She is not in acute distress.    Appearance: Normal appearance. She is well-developed and well-groomed. She is not ill-appearing, toxic-appearing or diaphoretic.  HENT:     Nose:  Congestion and rhinorrhea present. Rhinorrhea is clear.  Pulmonary:     Effort: Pulmonary effort is normal.  Neurological:     General: No focal deficit present.     Mental Status: She is alert and oriented to person, place, and time.  Psychiatric:        Attention and Perception: Attention and perception normal.        Mood and Affect: Mood and affect normal.        Speech: Speech normal.        Behavior: Behavior normal. Behavior is cooperative.        Thought Content: Thought content normal.         Cognition and Memory: Cognition and memory normal.        Judgment: Judgment normal.    Assessment and Plan: 1. Seasonal and perennial allergic rhinitis (Primary) Discussed with patient that likely due to allergies. Will start medication as below. Will send in medications as below given history of asthma. Discussed at home care - cetirizine (ZYRTEC) 10 MG tablet; Take 1 tablet (10 mg total) by mouth daily.  Dispense: 90 tablet; Refill: 0 - predniSONE (DELTASONE) 20 MG tablet; Take 2 tablets (40 mg total) by mouth daily with breakfast for 5 days.  Dispense: 10 tablet; Refill: 0  2. Sinus congestion As above  - cetirizine (ZYRTEC) 10 MG tablet; Take 1 tablet (10 mg total) by mouth daily.  Dispense: 90 tablet; Refill: 0 - predniSONE (DELTASONE) 20 MG tablet; Take 2 tablets (40 mg total) by mouth daily with breakfast for 5 days.  Dispense: 10 tablet; Refill: 0  3. Mild intermittent asthma without complication As above.  - predniSONE (DELTASONE) 20 MG tablet; Take 2 tablets (40 mg total) by mouth daily with breakfast for 5 days.  Dispense: 10 tablet; Refill: 0    Follow Up Instructions: Return if symptoms worsen or fail to improve.   I discussed the assessment and treatment plan with the patient. The patient was provided an opportunity to ask questions, and all were answered. The patient agreed with the plan and demonstrated an understanding of the instructions.   The patient was advised to call back or seek an in-person evaluation if the symptoms worsen or if the condition fails to improve as anticipated.  The above assessment and management plan was discussed with the patient. The patient verbalized understanding of and has agreed to the management plan.   Neale Burly, DNP-FNP [facility] [address] [place], [place] [address] [phone]

## 2024-02-21 ENCOUNTER — Other Ambulatory Visit: Payer: Self-pay | Admitting: Family Medicine

## 2024-02-21 ENCOUNTER — Other Ambulatory Visit (HOSPITAL_COMMUNITY): Payer: Self-pay

## 2024-02-21 DIAGNOSIS — G43109 Migraine with aura, not intractable, without status migrainosus: Secondary | ICD-10-CM

## 2024-02-22 ENCOUNTER — Other Ambulatory Visit: Payer: Self-pay

## 2024-03-02 ENCOUNTER — Other Ambulatory Visit (HOSPITAL_COMMUNITY): Payer: Self-pay

## 2024-03-02 ENCOUNTER — Telehealth: Payer: Self-pay

## 2024-03-02 NOTE — Telephone Encounter (Signed)
 Copied from CRM [id number]. Topic: Clinical - Medication Question >> Mar 02, 2024  1:06 PM Georgeann Kindred wrote: Reason for CRM: Patient called requesting a callback from nurse regarding the reason why her refill for ibuprofen  (ADVIL ) 800 MG tablet was denied. Patient can be contact back at [phone]

## 2024-03-03 ENCOUNTER — Other Ambulatory Visit: Payer: Self-pay

## 2024-03-03 MED ORDER — IBUPROFEN 800 MG PO TABS: 800.0000 mg | ORAL_TABLET | Freq: Three times a day (TID) | ORAL | 0 refills | Status: DC

## 2024-03-03 NOTE — Telephone Encounter (Signed)
 Pt is taking for migraines. I will go ahead and refill per pcp. Tried to notify pt but vm not set up. LS

## 2024-03-20 ENCOUNTER — Other Ambulatory Visit (HOSPITAL_COMMUNITY): Payer: Self-pay

## 2024-03-21 ENCOUNTER — Ambulatory Visit: Payer: 59 | Admitting: Family Medicine

## 2024-04-11 ENCOUNTER — Ambulatory Visit: Admitting: Family Medicine

## 2024-04-13 ENCOUNTER — Ambulatory Visit: Admitting: Family Medicine

## 2024-04-13 ENCOUNTER — Encounter: Payer: Self-pay | Admitting: Family Medicine

## 2024-04-19 ENCOUNTER — Telehealth: Admitting: Physician Assistant

## 2024-04-19 DIAGNOSIS — M549 Dorsalgia, unspecified: Secondary | ICD-10-CM

## 2024-04-19 NOTE — Progress Notes (Signed)
  Because of areas of numb/strange sensation and need for examination, I feel your condition warrants further evaluation and I recommend that you be seen in a face-to-face visit.   NOTE: There will be NO CHARGE for this E-Visit   If you are having a true medical emergency, please call 911.     For an urgent face to face visit, [place] has multiple urgent care centers for your convenience.  Click the link below for the full list of locations and hours, walk-in wait times, appointment scheduling options and driving directions:  Urgent Care - [place], [place], [place], [place], [place], [place]  [facility]     Your MyChart E-visit questionnaire answers were reviewed by a board certified advanced clinical practitioner to complete your personal care plan based on your specific symptoms.    Thank you for using e-Visits.

## 2024-04-20 ENCOUNTER — Other Ambulatory Visit (HOSPITAL_COMMUNITY): Payer: Self-pay

## 2024-04-20 ENCOUNTER — Ambulatory Visit: Admitting: Family Medicine

## 2024-04-20 ENCOUNTER — Other Ambulatory Visit: Payer: Self-pay

## 2024-04-20 ENCOUNTER — Encounter: Payer: Self-pay | Admitting: Family Medicine

## 2024-04-20 ENCOUNTER — Encounter: Payer: Self-pay | Admitting: Pharmacist

## 2024-04-20 VITALS — BP 97/66 | HR 80 | Temp 98.2°F | Ht 63.0 in | Wt 245.0 lb

## 2024-04-20 DIAGNOSIS — E038 Other specified hypothyroidism: Secondary | ICD-10-CM

## 2024-04-20 DIAGNOSIS — K219 Gastro-esophageal reflux disease without esophagitis: Secondary | ICD-10-CM | POA: Diagnosis not present

## 2024-04-20 DIAGNOSIS — E782 Mixed hyperlipidemia: Secondary | ICD-10-CM

## 2024-04-20 DIAGNOSIS — M25511 Pain in right shoulder: Secondary | ICD-10-CM

## 2024-04-20 DIAGNOSIS — Z1211 Encounter for screening for malignant neoplasm of colon: Secondary | ICD-10-CM | POA: Diagnosis not present

## 2024-04-20 DIAGNOSIS — R000 Tachycardia, unspecified: Secondary | ICD-10-CM

## 2024-04-20 DIAGNOSIS — G43709 Chronic migraine without aura, not intractable, without status migrainosus: Secondary | ICD-10-CM

## 2024-04-20 DIAGNOSIS — F411 Generalized anxiety disorder: Secondary | ICD-10-CM | POA: Diagnosis not present

## 2024-04-20 DIAGNOSIS — E559 Vitamin D deficiency, unspecified: Secondary | ICD-10-CM | POA: Diagnosis not present

## 2024-04-20 DIAGNOSIS — J4520 Mild intermittent asthma, uncomplicated: Secondary | ICD-10-CM | POA: Diagnosis not present

## 2024-04-20 DIAGNOSIS — Z6841 Body Mass Index (BMI) 40.0 and over, adult: Secondary | ICD-10-CM

## 2024-04-20 MED ORDER — PREDNISONE 20 MG PO TABS
40.0000 mg | ORAL_TABLET | Freq: Every day | ORAL | 0 refills | Status: AC
  Filled 2024-04-20: qty 10, 5d supply, fill #0

## 2024-04-20 MED ORDER — METHYLPREDNISOLONE ACETATE 40 MG/ML IJ SUSP
40.0000 mg | Freq: Once | INTRAMUSCULAR | Status: AC
  Administered 2024-04-20: 40 mg via INTRAMUSCULAR

## 2024-04-20 MED ORDER — IBUPROFEN 800 MG PO TABS
800.0000 mg | ORAL_TABLET | Freq: Two times a day (BID) | ORAL | 0 refills | Status: AC | PRN
  Filled 2024-04-20 – 2024-06-23 (×2): qty 90, 45d supply, fill #0

## 2024-04-20 NOTE — Progress Notes (Signed)
 Subjective:  Patient ID: Summer [name], female    DOB: 1976/10/08, 48 y.o.   MRN: [id number]  Patient Care Team: Chrystine Crate, FNP as PCP - General (Family Medicine)   Chief Complaint:  Medical Management of Chronic Issues (3 month follow up/Right shoulder pain/)   HPI: Summer [name] is a 48 y.o. female presenting on 04/20/2024 for Medical Management of Chronic Issues (3 month follow up/Right shoulder pain/)  HPI Acute Right Shoulder Pain  Reports that a few years ago she was having issues with her left shoulder and it has now moved to her right. States that it started a few days ago. States that she pushes and pulls daily at work. 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 Laying on it makes it worse. She is trying heat, ice, ibuprofen , biofreeze. Rates it a 4/10 today. Does not desire PT at this time. Would consider it in the future. Reports that it radiates to her posterior shoulder. Endorses some numbness and tingling.   Migraines  States that she has only had a couple in the last month.  Doing a lot better overall. Continues to take topiramate .  Taking nurtec as needed and its working well.  Was previously going to neurology and receiving ajovy  but has not followed up in a while and is not currently taking ajovy , states that she was having issues getting her insurance to continue it. Taking ibuprofen  for shoulder pain and headaches. She is taking it 2-3 times per week.   GAD  Continues on prozac . Doing well. Denies SI. Declines counseling. Would like to continue with current dose. She is using hydroxyzine  as needed. States that it makes her drowsy, so she is only taking it in the evenings. She has not tried cutting it in half.   Tachycardia  Taking metoprolol  as needed. Reports that it does not happen everyday. States that it happens randomly.   HLD/obesity  Not taking RYR. She is trying to mindful of what she cooks as her  husband has diabetes. She is walking 2-3 times per week.   Asthma  Has not used rescue inhaler recently. States that she is triggered by heat.  Denies nighttime awakenings.   GERD Current medications - prilosec  Adverse side effects - no  Sore throat - no Voice change - no Hemoptysis - no Dysphagia or dyspepsia - no Water brash - no Red Flags (weight loss, hematochezia, melena, weight loss, early satiety, fevers, odynophagia, or persistent vomiting) - no  Thyroid   Thyroid : Patient presents for evaluation of subclinical hypothyroidism. Current symptoms include swelling. Patient denies fatigue, weight gain, feeling cold and cold intolerance, constipation, anxiousness, feeling excessive energy, tremulousness, palpitations, sweating, weight loss, diarrhea, change in skin,  nails, or hair, heat intolerance, depression, goiter, ocular symptoms.    Relevant past medical, surgical, family, and social history reviewed and updated as indicated.  Allergies and medications reviewed and updated. Data reviewed: Chart in Epic.   Past Medical History:  Diagnosis Date   Angio-edema    Anxiety    Asthma    GERD (gastroesophageal reflux disease)    Mixed hyperlipidemia 03/27/2021    Past Surgical History:  Procedure Laterality Date   BREAST BIOPSY Left 02/10/2024   path pending   BREAST BIOPSY Left 02/10/2024   US  LT BREAST BX W LOC DEV 1ST LESION IMG BX SPEC US  GUIDE 02/10/2024 Alger Infield, MD [facility]-ULTRAS[facility]   CHOLECYSTECTOMY N/A 02/21/2014  Procedure: LAPAROSCOPIC CHOLECYSTECTOMY;  Surgeon: Beau Bound, MD;  Location: [facility];  Service: General;  Laterality: N/A;   GALLBLADDER SURGERY     TUBAL LIGATION     TYMPANOSTOMY TUBE PLACEMENT Bilateral     Social History   Socioeconomic History   Marital status: Married    Spouse name: Not on file   Number of children: Not on file   Years of education: Not on file   Highest education level: Not on file  Occupational History   Not on  file  Tobacco Use   Smoking status: Former    Current packs/day: 0.00    Average packs/day: 0.5 packs/day for 3.0 years (1.5 ttl pk-yrs)    Types: Cigarettes    Start date: 02/21/1995    Quit date: 02/20/1998    Years since quitting: 26.1   Smokeless tobacco: Never  Vaping Use   Vaping status: Never Used  Substance and Sexual Activity   Alcohol use: No   Drug use: No   Sexual activity: Yes    Birth control/protection: Surgical  Other Topics Concern   Not on file  Social History Narrative   Not on file   Social Drivers of Health   Financial Resource Strain: Not on file  Food Insecurity: Not on file  Transportation Needs: Not on file  Physical Activity: Not on file  Stress: Not on file  Social Connections: Not on file  Intimate Partner Violence: Not on file    Outpatient Encounter Medications as of 04/20/2024  Medication Sig   albuterol  (VENTOLIN  HFA) 108 (90 Base) MCG/ACT inhaler Inhale 2 puffs into the lungs every 6 (six) hours as needed.   cetirizine  (ZYRTEC ) 10 MG tablet Take 1 tablet (10 mg total) by mouth daily.   FLUoxetine  (PROZAC ) 40 MG capsule Take 1 capsule (40 mg total) by mouth daily.   fluticasone  (FLONASE ) 50 MCG/ACT nasal spray Place 2 sprays into both nostrils daily.   Fremanezumab -vfrm (AJOVY ) 225 MG/1.5ML SOAJ Inject 225 mg into the skin every 30 (thirty) days.   hydrOXYzine  (ATARAX ) 25 MG tablet Take 1 tablet (25 mg total) by mouth 3 (three) times daily as needed for anxiety.   ibuprofen  (ADVIL ) 800 MG tablet Take 1 tablet (800 mg total) by mouth 3 (three) times daily.   levothyroxine  (SYNTHROID ) 50 MCG tablet Take 1 tablet by mouth daily.   metoprolol  tartrate (LOPRESSOR ) 25 MG tablet Take 1/2 tablet by mouth 2 times daily as needed (high heart rate).   montelukast  (SINGULAIR ) 10 MG tablet Take 1 tablet by mouth at bedtime.   omeprazole  (PRILOSEC) 20 MG capsule Take 1 capsule by mouth daily.   Rimegepant Sulfate  (NURTEC) 75 MG TBDP Dissolve one tablet by  mouth once as needed for up to 1 dose. Not to exceed one tablet per 24-hr period. Not to exceed 18 doses/30 days   topiramate  (TOPAMAX ) 50 MG tablet Take 1 and 1/2 tablets by mouth 2 times daily.   valACYclovir  (VALTREX ) 1000 MG tablet Take 2 tablets by mouth every 12 hours for 1 day at onset of symptoms of a cold sore. Take as needed.   [DISCONTINUED] busPIRone  (BUSPAR ) 5 MG tablet Take 1 tablet (5 mg total) by mouth 3 (three) times daily. (Patient not taking: Reported on 12/21/2023)   No facility-administered encounter medications on file as of 04/20/2024.    Allergies  Allergen Reactions   Bee Pollen    Molds & Smuts    Tramadol     Vomiting and nausea  Review of Systems As per HPI  Objective:  BP 97/66   Pulse 80   Temp 98.2 F (36.8 C)   Ht 5' 3 (1.6 m)   Wt 245 lb (111.1 kg)   SpO2 98%   BMI 43.40 kg/m    Wt Readings from Last 3 Encounters:  04/20/24 245 lb (111.1 kg)  12/21/23 241 lb 12.8 oz (109.7 kg)  10/20/23 236 lb (107 kg)   Physical Exam Constitutional:      General: She is awake. She is not in acute distress.    Appearance: Normal appearance. She is well-developed and well-groomed. She is morbidly obese. She is not ill-appearing, toxic-appearing or diaphoretic.   Cardiovascular:     Rate and Rhythm: Normal rate and regular rhythm.     Heart sounds: Normal heart sounds. No murmur heard.    No gallop.  Pulmonary:     Effort: Pulmonary effort is normal. No respiratory distress.     Breath sounds: Normal breath sounds. No stridor. No wheezing, rhonchi or rales.   Musculoskeletal:     Right shoulder: Tenderness, bony tenderness and crepitus present. No swelling, deformity, effusion or laceration. Decreased range of motion. Normal strength. Normal pulse.     Right upper arm: No swelling, edema, deformity or lacerations.     Cervical back: Full passive range of motion without pain and neck supple.   Skin:    General: Skin is warm.     Capillary Refill:  Capillary refill takes less than 2 seconds.   Neurological:     General: No focal deficit present.     Mental Status: She is alert, oriented to person, place, and time and easily aroused. Mental status is at baseline.     GCS: GCS eye subscore is 4. GCS verbal subscore is 5. GCS motor subscore is 6.     Motor: No weakness.   Psychiatric:        Attention and Perception: Attention and perception normal.        Mood and Affect: Mood and affect normal.        Speech: Speech normal.        Behavior: Behavior normal. Behavior is cooperative.        Thought Content: Thought content normal. Thought content does not include homicidal or suicidal ideation. Thought content does not include homicidal or suicidal plan.        Cognition and Memory: Cognition and memory normal.        Judgment: Judgment normal.     Results for orders placed or performed during the hospital encounter of 02/10/24  Surgical pathology   Collection Time: 02/10/24  8:35 AM  Result Value Ref Range   SURGICAL PATHOLOGY      SURGICAL PATHOLOGY CASE: [id number] PATIENT: Bayard Bottcher Surgical Pathology Report     Clinical History: Question FCC/ papilloma, R/O malignancy, abnormal mammogram R92.8 (las)     FINAL MICROSCOPIC DIAGNOSIS:  A. BREAST, LEFT 9 O'CLOCK, NEEDLE CORE BIOPSY: - Fibrocystic change with apocrine metaplasia, seen comment - Negative for carcinoma      COMMENT:  Ms. Ladonna Pickup was notified on 02/11/2024.    GROSS DESCRIPTION:  A. Received in formalin labeled with the patient's name (Shandale Denker) and Lt breast are three pieces of yellow-tan fibrofatty tissue ranging from 0.4 x 0.2 x 0.2 cm to 0.6 x 0.2 x 0.2 cm, submitted in toto in a single cassette. TIF 08:25, CIT <1 minute.  (LEF 02/09/2024)   Final Diagnosis performed  by Lee Public, MD.   Electronically signed 02/11/2024 Technical component performed at [facility], [address]  [place], [place] [address].  Professional component performed at [facility], [address], [place], [place] [address].  Immunohistochemistry Technical component (if applicable) was performed at [facility]. [address], [place], [place] [address].   IMMUNOHISTOCHEMISTRY DISCLAIMER (if applicable): Some of these immunohistochemical stains may have been developed and the performance characteristics determine by [facility]. Some may not have been cleared or approved by the U.S. Food and Drug Administration. The FDA has determined that such clearance or approval is not necessary. This test is used for clinical purposes. It should not be regarded as investigational or for research. This laboratory is certified under the Clinical Laboratory Improvement Amendments of 1988 (CLIA-88) as qualified to perform high complexity clinical laboratory testing.  The controls stained appropriately.   IHC stains are performed on formalin fixed, paraffin embedded tissue using a 3,3diaminobenzidine (DAB) chr omogen and Leica Bond Autostainer System. The staining intensity of the nucleus is score manually and is reported as the percentage of tumor cell nuclei demonstrating specific nuclear staining. The specimens are fixed in 10% Neutral Formalin for at least 6 hours and up to 72hrs. These tests are validated on decalcified tissue. Results should be interpreted with caution given the possibility of false negative results on decalcified specimens. Antibody Clones are as follows ER-clone 25F, PR-clone 16, Ki67- clone MM1. Some of these immunohistochemical stains may have been developed and the performance characteristics determined by [facility].        04/20/2024   10:13 AM 12/21/2023    3:11 PM 09/23/2023   11:30 AM 06/22/2023   11:24 AM 03/12/2023    1:19 PM  Depression screen PHQ 2/9  Decreased Interest 1 1 0 0 0  Down, Depressed,  Hopeless 1 1 0 0 0  PHQ - 2 Score 2 2 0 0 0  Altered sleeping 0 0 0  0  Tired, decreased energy 1 0 1  1  Change in appetite 0 0 0  0  Feeling bad or failure about yourself  0 0 0  0  Trouble concentrating 0 0 0  0  Moving slowly or fidgety/restless 0 0 0  0  Suicidal thoughts 0 0 0  0  PHQ-9 Score 3 2 1  1   Difficult doing work/chores Not difficult at all Not difficult at all   Not difficult at all       04/20/2024   10:13 AM 12/21/2023    3:11 PM 09/23/2023   11:30 AM 03/12/2023    1:19 PM  GAD 7 : Generalized Anxiety Score  Nervous, Anxious, on Edge 1 1 1 1   Control/stop worrying 0 0 1 0  Worry too much - different things 0 0 0 0  Trouble relaxing 0 0 0 0  Restless 0 0 0 0  Easily annoyed or irritable 1 1 1 1   Afraid - awful might happen 0 0 0 0  Total GAD 7 Score 2 2 3 2   Anxiety Difficulty Somewhat difficult Not difficult at all Somewhat difficult Not difficult at all      Pertinent labs & imaging results that were available during my care of the patient were reviewed by me and considered in my medical decision making.  Assessment & Plan:  Rosalva was seen today for medical management of chronic issues.  Diagnoses and all orders for  this visit: 1. GAD (generalized anxiety disorder) (Primary) Well controlled. Continue current regimen. Safety contract established today with patient in clinic. Denies intent to harm herself or others.   2. Morbid obesity (HCC) Discussed with patient to continue healthy lifestyle choices, including diet (rich in fruits, vegetables, and lean proteins, and low in salt and simple carbohydrates) and exercise (at least 30 minutes of moderate physical activity daily). Limit beverages high is sugar. Recommended at least 80-100 oz of water daily.   3. Mixed hyperlipidemia Labs as below. Will communicate results to patient once available. Will await results to determine next steps.  Fasting  - Lipid panel  4. Mild intermittent asthma without  complication Well controlled. Continue current regimen.   5. Tachycardia Well controlled. Continue current regimen.  Discussed with patient following up with cardiology and switching to propranolol as she has history of migraines and is taking medications as needed. Patient to consider.   6. Screening for colon cancer - Ambulatory referral to Gastroenterology  7. Chronic migraine without aura without status migrainosus, not intractable Well controlled. Continue current regimen. Labs as below. Will communicate results to patient once available. Will await results to determine next steps. Patient to follow up with neurology as needed.  - ibuprofen  (ADVIL ) 800 MG tablet; Take 1 tablet (800 mg total) by mouth 2 (two) times daily as needed for headache.  Dispense: 90 tablet; Refill: 0 - CBC with Differential/Platelet - CMP14+EGFR  8. Gastroesophageal reflux disease without esophagitis Well controlled. Continue current regimen. Labs as below. Will communicate results to patient once available. Will await results to determine next steps.  - CBC with Differential/Platelet - CMP14+EGFR  9. Subclinical hypothyroidism Labs as below. Will communicate results to patient once available. Will await results to determine next steps.  - Thyroid  Panel With TSH  10. Acute pain of right shoulder Provided injection in office today.  Conservative management as below.  If pain continue, will pursue additional evaluation.  - ibuprofen  (ADVIL ) 800 MG tablet; Take 1 tablet (800 mg total) by mouth 2 (two) times daily as needed for headache.  Dispense: 90 tablet; Refill: 0 - predniSONE  (DELTASONE ) 20 MG tablet; Take 2 tablets (40 mg total) by mouth daily with breakfast for 5 days.  Dispense: 10 tablet; Refill: 0 - methylPREDNISolone  acetate (DEPO-MEDROL ) injection 40 mg  11. Vitamin D  deficiency Labs as below. Will communicate results to patient once available. Will await results to determine next steps.  -  VITAMIN D  25 Hydroxy (Vit-D Deficiency, Fractures)   Continue all other maintenance medications.  Follow up plan: Return in about 6 months (around 10/20/2024) for Chronic Condition Follow up.   Continue healthy lifestyle choices, including diet (rich in fruits, vegetables, and lean proteins, and low in salt and simple carbohydrates) and exercise (at least 30 minutes of moderate physical activity daily).  Written and verbal instructions provided   The above assessment and management plan was discussed with the patient. The patient verbalized understanding of and has agreed to the management plan. Patient is aware to call the clinic if they develop any new symptoms or if symptoms persist or worsen. Patient is aware when to return to the clinic for a follow-up visit. Patient educated on when it is appropriate to go to the emergency department.   Jacqualyn Mates, DNP-FNP [facility] [address] [place], [place] [address] [phone]

## 2024-04-21 ENCOUNTER — Ambulatory Visit: Payer: Self-pay | Admitting: Family Medicine

## 2024-04-21 LAB — CBC WITH DIFFERENTIAL/PLATELET
Basophils Absolute: 0 10*3/uL (ref 0.0–0.2)
Basos: 0 %
EOS (ABSOLUTE): 0.3 10*3/uL (ref 0.0–0.4)
Eos: 3 %
Hematocrit: 44 % (ref 34.0–46.6)
Hemoglobin: 13.7 g/dL (ref 11.1–15.9)
Immature Grans (Abs): 0 10*3/uL (ref 0.0–0.1)
Immature Granulocytes: 0 %
Lymphocytes Absolute: 2.4 10*3/uL (ref 0.7–3.1)
Lymphs: 32 %
MCH: 28.5 pg (ref 26.6–33.0)
MCHC: 31.1 g/dL — ABNORMAL LOW (ref 31.5–35.7)
MCV: 92 fL (ref 79–97)
Monocytes Absolute: 0.5 10*3/uL (ref 0.1–0.9)
Monocytes: 6 %
Neutrophils Absolute: 4.4 10*3/uL (ref 1.4–7.0)
Neutrophils: 59 %
Platelets: 282 10*3/uL (ref 150–450)
RBC: 4.8 x10E6/uL (ref 3.77–5.28)
RDW: 12.6 % (ref 11.7–15.4)
WBC: 7.6 10*3/uL (ref 3.4–10.8)

## 2024-04-21 LAB — LIPID PANEL
Chol/HDL Ratio: 4.4 ratio (ref 0.0–4.4)
Cholesterol, Total: 172 mg/dL (ref 100–199)
HDL: 39 mg/dL — ABNORMAL LOW (ref >39)
LDL Chol Calc (NIH): 104 mg/dL — ABNORMAL HIGH (ref 0–99)
Triglycerides: 163 mg/dL — ABNORMAL HIGH (ref 0–149)
VLDL Cholesterol Cal: 29 mg/dL (ref 5–40)

## 2024-04-21 LAB — CMP14+EGFR
ALT: 14 IU/L (ref 0–32)
AST: 20 IU/L (ref 0–40)
Albumin: 4 g/dL (ref 3.9–4.9)
Alkaline Phosphatase: 88 IU/L (ref 44–121)
BUN/Creatinine Ratio: 11 (ref 9–23)
BUN: 8 mg/dL (ref 6–24)
Bilirubin Total: <0.2 mg/dL (ref 0.0–1.2)
CO2: 17 mmol/L — ABNORMAL LOW (ref 20–29)
Calcium: 9 mg/dL (ref 8.7–10.2)
Chloride: 103 mmol/L (ref 96–106)
Creatinine, Ser: 0.74 mg/dL (ref 0.57–1.00)
Globulin, Total: 3.2 g/dL (ref 1.5–4.5)
Glucose: 84 mg/dL (ref 70–99)
Potassium: 4.3 mmol/L (ref 3.5–5.2)
Sodium: 141 mmol/L (ref 134–144)
Total Protein: 7.2 g/dL (ref 6.0–8.5)
eGFR: 100 mL/min/{1.73_m2} (ref >59)

## 2024-04-21 LAB — THYROID PANEL WITH TSH
Free Thyroxine Index: 1.7 (ref 1.2–4.9)
T3 Uptake Ratio: 22 % — ABNORMAL LOW (ref 24–39)
T4, Total: 7.5 ug/dL (ref 4.5–12.0)
TSH: 5.86 u[IU]/mL — ABNORMAL HIGH (ref 0.450–4.500)

## 2024-04-21 LAB — VITAMIN D 25 HYDROXY (VIT D DEFICIENCY, FRACTURES): Vit D, 25-Hydroxy: 25.8 ng/mL — ABNORMAL LOW (ref 30.0–100.0)

## 2024-04-21 NOTE — Progress Notes (Signed)
 Cbc pending. TSH slightly elevated but T4 is normal. Will not change dose at this time. Recommend taking medications as prescribed and repeating labs in 6-8 weeks. Vitamin D  is still low. Recommend 2000 international units daily. If already taking, let me know and I will send in prescription. Cholesterol improved. Continue diet and lifestyle modifications. Very low ASCVD risk score. The 10-year ASCVD risk score (Arnett DK, et al., 2019) is: 0.8%

## 2024-04-21 NOTE — Progress Notes (Signed)
 Cbc with slight variation, not concerning at this time. Will continue to monitor.

## 2024-05-03 ENCOUNTER — Encounter (INDEPENDENT_AMBULATORY_CARE_PROVIDER_SITE_OTHER): Payer: Self-pay | Admitting: *Deleted

## 2024-05-22 ENCOUNTER — Encounter (INDEPENDENT_AMBULATORY_CARE_PROVIDER_SITE_OTHER): Payer: Self-pay | Admitting: *Deleted

## 2024-06-23 ENCOUNTER — Other Ambulatory Visit (HOSPITAL_COMMUNITY): Payer: Self-pay

## 2024-07-21 ENCOUNTER — Ambulatory Visit: Admitting: Family Medicine

## 2024-07-24 ENCOUNTER — Encounter: Payer: Self-pay | Admitting: Family Medicine

## 2024-10-16 ENCOUNTER — Other Ambulatory Visit: Payer: Self-pay

## 2024-10-16 ENCOUNTER — Telehealth

## 2024-10-16 ENCOUNTER — Other Ambulatory Visit: Payer: Self-pay | Admitting: Family Medicine

## 2024-10-16 ENCOUNTER — Other Ambulatory Visit (HOSPITAL_BASED_OUTPATIENT_CLINIC_OR_DEPARTMENT_OTHER): Payer: Self-pay

## 2024-10-16 DIAGNOSIS — B001 Herpesviral vesicular dermatitis: Secondary | ICD-10-CM

## 2024-10-17 MED ORDER — VALACYCLOVIR HCL 1 G PO TABS: ORAL_TABLET | ORAL | 1 refills | Status: DC

## 2024-10-17 NOTE — Progress Notes (Signed)
 We are sorry that you are not feeling well.  Here is how we plan to help!  Based on what you have shared with me it does look like you have a viral infection.    Most cold sores or fever blisters are small fluid filled blisters around the mouth caused by herpes simplex virus.  The most common strain of the virus causing cold sores is herpes simplex virus 1.  It can be spread by skin contact, sharing eating utensils, or even sharing towels.  Cold sores are contagious to other people until dry. (Approximately 5-7 days).  Wash your hands. You can spread the virus to your eyes through handling your contact lenses after touching the lesions.  Most people experience pain at the sight or tingling sensations in their lips that may begin before the ulcers erupt.  Herpes simplex is treatable but not curable.  It may lie dormant for a long time and then reappear due to stress or prolonged sun exposure.  Many patients have success in treating their cold sores with an over the counter topical called Abreva.  You may apply the cream up to 5 times daily (maximum 10 days) until healing occurs.  If you would like to use an oral antiviral medication to speed the healing of your cold sore, I have sent a prescription to your local pharmacy Valacyclovir  2 gm take one by mouth twice a day for 1 day    HOME CARE:  Wash your hands frequently. Do not pick at or rub the sore. Don't open the blisters. Avoid kissing other people during this time. Avoid sharing drinking glasses, eating utensils, or razors. Do not handle contact lenses unless you have thoroughly washed your hands with soap and warm water ! Avoid oral sex during this time.  Herpes from sores on your mouth can spread to your partner's genital area. Avoid contact with anyone who has eczema or a weakened immune system. Cold sores are often triggered by exposure to intense sunlight, use a lip balm containing a sunscreen (SPF 30 or higher).  GET HELP RIGHT AWAY  IF:  Blisters look infected. Blisters occur near or in the eye. Symptoms last longer than 10 days. Your symptoms become worse.  MAKE SURE YOU:  Understand these instructions. Will watch your condition. Will get help right away if you are not doing well or get worse.    Your e-visit answers were reviewed by a board certified advanced clinical practitioner to complete your personal care plan.  Depending upon the condition, your plan could have  Included both over the counter or prescription medications.    Please review your pharmacy choice.  Be sure that the pharmacy you have chosen is open so that you can pick up your prescription now.  If there is a problem you can message your provider in MyChart to have the prescription routed to another pharmacy.    Your safety is important to us .  If you have drug allergies check our prescription carefully.  For the next 24 hours you can use MyChart to ask questions about today's visit, request a non-urgent call back, or ask for a work or school excuse from your e-visit provider.  You will get an email in the next two days asking about your experience.  I hope that your e-visit has been valuable and will speed your recovery.   I have spent 5 minutes in review of e-visit questionnaire, review and updating patient chart, medical decision making and response to patient.  Elsie Velma Lunger, PA-C

## 2024-11-03 ENCOUNTER — Encounter (HOSPITAL_BASED_OUTPATIENT_CLINIC_OR_DEPARTMENT_OTHER): Payer: Self-pay | Admitting: Emergency Medicine

## 2024-11-03 ENCOUNTER — Emergency Department (HOSPITAL_BASED_OUTPATIENT_CLINIC_OR_DEPARTMENT_OTHER)
Admission: EM | Admit: 2024-11-03 | Discharge: 2024-11-03 | Disposition: A | Discharge status: Home / Self Care | Attending: Emergency Medicine | Admitting: Emergency Medicine

## 2024-11-03 ENCOUNTER — Other Ambulatory Visit: Payer: Self-pay

## 2024-11-03 DIAGNOSIS — J45909 Unspecified asthma, uncomplicated: Secondary | ICD-10-CM | POA: Insufficient documentation

## 2024-11-03 DIAGNOSIS — R509 Fever, unspecified: Secondary | ICD-10-CM | POA: Diagnosis present

## 2024-11-03 DIAGNOSIS — J101 Influenza due to other identified influenza virus with other respiratory manifestations: Secondary | ICD-10-CM | POA: Insufficient documentation

## 2024-11-03 LAB — RESP PANEL BY RT-PCR (RSV, FLU A&B, COVID)  RVPGX2
Influenza A by PCR: NEGATIVE
Influenza B by PCR: POSITIVE — AB
Resp Syncytial Virus by PCR: NEGATIVE
SARS Coronavirus 2 by RT PCR: NEGATIVE

## 2024-11-03 MED ORDER — ACETAMINOPHEN 500 MG PO TABS
1000.0000 mg | ORAL_TABLET | Freq: Once | ORAL | Status: AC
  Administered 2024-11-03: 1000 mg via ORAL
  Filled 2024-11-03: qty 2

## 2024-11-03 NOTE — ED Provider Notes (Signed)
 "  [facility]  Provider Note  CSN: [id number] Arrival date & time: 11/03/24 9647  History Chief Complaint  Patient presents with   Fever    Summer [name] is a 48 y.o. female with history of asthma reports 3-4 days of nasal congestion, sore throat, mild cough progressed to fever yesterday   Home Medications Prior to Admission medications  Medication Sig Start Date End Date Taking? Authorizing Provider  albuterol  (VENTOLIN  HFA) 108 (90 Base) MCG/ACT inhaler Inhale 2 puffs into the lungs every 6 (six) hours as needed. 10/20/23   Milian, Marry Lenis, FNP  cetirizine  (ZYRTEC ) 10 MG tablet Take 1 tablet (10 mg total) by mouth daily. 02/16/24   Milian, Marry Lenis, FNP  FLUoxetine  (PROZAC ) 40 MG capsule Take 1 capsule (40 mg total) by mouth daily. 10/20/23   Milian, Marry Lenis, FNP  fluticasone  (FLONASE ) 50 MCG/ACT nasal spray Place 2 sprays into both nostrils daily. 10/20/23   Milian, Marry Lenis, FNP  hydrOXYzine  (ATARAX ) 25 MG tablet Take 1 tablet (25 mg total) by mouth 3 (three) times daily as needed for anxiety. 12/21/23   Milian, Marry Lenis, FNP  ibuprofen  (ADVIL ) 800 MG tablet Take 1 tablet (800 mg total) by mouth 2 (two) times daily as needed for headache. 04/20/24   Milian, Marry Lenis, FNP  levothyroxine  (SYNTHROID ) 50 MCG tablet Take 1 tablet by mouth daily. 10/20/23   Milian, Marry Lenis, FNP  metoprolol  tartrate (LOPRESSOR ) 25 MG tablet Take 1/2 tablet by mouth 2 times daily as needed (high heart rate). 03/12/23   Milian, Marry Lenis, FNP  montelukast  (SINGULAIR ) 10 MG tablet Take 1 tablet by mouth at bedtime. 10/20/23   Milian, Marry Lenis, FNP  omeprazole  (PRILOSEC) 20 MG capsule Take 1 capsule by mouth daily. 10/20/23   Milian, Marry Lenis, FNP  Rimegepant Sulfate  (NURTEC) 75 MG TBDP Dissolve one tablet by mouth once as needed for up to 1 dose. Not to exceed one tablet per 24-hr period. Not to  exceed 18 doses/30 days 12/21/23   Cathlene Marry Lenis, FNP  topiramate  (TOPAMAX ) 50 MG tablet Take 1 and 1/2 tablets by mouth 2 times daily. 10/20/23   Milian, Marry Lenis, FNP  valACYclovir  (VALTREX ) 1000 MG tablet Take 2 tablets by mouth every 12 hours for 1 day at onset of symptoms of a cold sore. Take as needed. 10/17/24   Gladis Elsie BROCKS, PA-C     Allergies    Bee pollen, Molds & smuts, and Tramadol   Review of Systems   Review of Systems Please see HPI for pertinent positives and negatives  Physical Exam BP (!) 137/95 (BP Location: Right Arm)   Pulse (!) 112   Temp (!) 100.7 F (38.2 C) (Oral)   Resp 20   Ht 5' 3 (1.6 m)   Wt 99.8 kg   SpO2 96%   BMI 38.97 kg/m   Physical Exam Vitals and nursing note reviewed.  Constitutional:      Appearance: Normal appearance.  HENT:     Head: Normocephalic and atraumatic.     Nose: Nose normal.     Mouth/Throat:     Mouth: Mucous membranes are moist.     Pharynx: No oropharyngeal exudate or posterior oropharyngeal erythema.  Eyes:     Extraocular Movements: Extraocular movements intact.     Conjunctiva/sclera: Conjunctivae normal.  Cardiovascular:     Rate and Rhythm: Normal rate.  Pulmonary:     Effort: Pulmonary effort is normal.  Breath sounds: Normal breath sounds. No wheezing or rales.  Abdominal:     General: Abdomen is flat.     Palpations: Abdomen is soft.     Tenderness: There is no abdominal tenderness.  Musculoskeletal:        General: No swelling. Normal range of motion.     Cervical back: Neck supple.  Skin:    General: Skin is warm and dry.  Neurological:     General: No focal deficit present.     Mental Status: She is alert.  Psychiatric:        Mood and Affect: Mood normal.     ED Results / Procedures / Treatments   EKG None  Procedures Procedures  Medications Ordered in the ED Medications  acetaminophen  (TYLENOL ) tablet 1,000 mg (1,000 mg Oral Given 11/03/24 0422)     Initial Impression and Plan  Patient here with viral URI symptoms. Exam is reassuring, mild fever and tachycardia on arrival, will give APAP.  Check Covid/Flu/RSV swab   ED Course   Clinical Course as of 11/03/24 0508  Fri Nov 03, 2024  0505 Covid/Flu/RSV swab is positive for influenza B, recommend continued supportive care, oral hydration, rest and isolation. PCP follow up, RTED for any other concerns.  [CS]    Clinical Course User Index [CS] Roselyn Carlin NOVAK, MD     MDM Rules/Calculators/A&P Medical Decision Making Problems Addressed: Influenza B: acute illness or injury  Amount and/or Complexity of Data Reviewed Labs: ordered. Decision-making details documented in ED Course.  Risk OTC drugs.     Final Clinical Impression(s) / ED Diagnoses Final diagnoses:  Influenza B    Rx / DC Orders ED Discharge Orders     None        Roselyn Carlin NOVAK, MD 11/03/24 [phone]  "

## 2024-11-03 NOTE — ED Triage Notes (Signed)
Cough, sore throat and fever since yesterday.

## 2024-11-14 ENCOUNTER — Other Ambulatory Visit (HOSPITAL_BASED_OUTPATIENT_CLINIC_OR_DEPARTMENT_OTHER): Payer: Self-pay

## 2024-11-22 ENCOUNTER — Encounter (INDEPENDENT_AMBULATORY_CARE_PROVIDER_SITE_OTHER): Payer: Self-pay | Admitting: *Deleted

## 2024-11-26 ENCOUNTER — Telehealth: Admitting: Family

## 2024-11-26 DIAGNOSIS — B002 Herpesviral gingivostomatitis and pharyngotonsillitis: Secondary | ICD-10-CM | POA: Diagnosis not present

## 2024-11-26 DIAGNOSIS — B001 Herpesviral vesicular dermatitis: Secondary | ICD-10-CM

## 2024-11-26 MED ORDER — VALACYCLOVIR HCL 1 G PO TABS: ORAL_TABLET | ORAL | 1 refills | Status: AC

## 2024-11-26 NOTE — Progress Notes (Signed)
 We are sorry that you are not feeling well.  Here is how we plan to help!  Based on your symptoms, it appears you may have a viral infection.   Cold sores, also known as fever blisters, are small, fluid-filled blisters that typically appear around the mouth. They are caused by the herpes simplex virus, most commonly herpes simplex virus type 1 (HSV-1). The virus spreads through direct skin contact, as well as by sharing items like eating utensils, lip balms, or towels.   Cold sores are contagious until they dry out, which usually takes about 5 to 7 days. Its important to wash your hands frequently, especially after touching the affected area. If you wear contact lenses, avoid handling them after touching a cold sore, as the virus can spread to your eyes and cause complications.   Most people experience pain at the site or tingling sensations in their lips that may begin before the ulcers erupt.  Herpes simplex is treatable but not curable.  It may lie dormant for a long time and then reappear due to stress or prolonged sun exposure.  Many patients have success in treating their cold sores with an over the counter topical called Abreva.  You may apply the cream up to 5 times daily (maximum 10 days) until healing occurs.  To help speed the healing of your cold sore, I have prescribed Valacyclovir  1000 mg -- Take two pills by mouth twice a day for 1 day    HOME CARE:  Wash your hands frequently. Do not pick at or rub the sore. Don't pop the blisters. Avoid kissing other people during this time. Avoid sharing drinking glasses, eating utensils, or razors. Do not handle contact lenses unless you have thoroughly washed your hands with soap and warm water! Avoid oral sex during this time.  Herpes from sores on your mouth can spread to your partner's genital area. Avoid contact with anyone who has eczema or a weakened immune system. Cold sores are often triggered by exposure to intense sunlight,  use a lip balm containing a sunscreen (SPF 30 or higher).  GET HELP RIGHT AWAY IF:  Blisters look infected -- increased redness around the site, warmth of skin, drainage of pus from around the area. Blisters occur near or in the eye. Symptoms last longer than 10 days. Your symptoms worsen.  MAKE SURE YOU:  Understand these instructions. Will watch your condition. Will get help right away if you are not doing well or get worse.  Your e-visit answers were reviewed by a board certified advanced clinical practitioner to complete your personal care plan.  Depending upon the condition, your plan could have included both over the counter or prescription medications.     Please review your pharmacy choice.  Be sure that the pharmacy you have chosen is open so that you can pick up your prescription now.  If there is a problem, you can message your provider in MyChart to have the prescription routed to another pharmacy.     Your safety is important to us .  If you have drug allergies, check our prescription carefully.   For the next 24 hours you can use MyChart to ask questions about today's visit, request a non-urgent call back, or ask for a work or school excuse from your e-visit provider.   You will receive an email in the next two days asking about your experience.  I hope that your e-visit has been valuable and will speed up your recovery.  I have spent 5 minutes in review of e-visit questionnaire, review and updating patient chart, medical decision making and response to patient.   Bari Learn, FNP

## 2024-12-05 ENCOUNTER — Ambulatory Visit: Admitting: Family Medicine

## 2024-12-21 ENCOUNTER — Ambulatory Visit: Admitting: Family Medicine
